# Patient Record
Sex: Male | Born: 1950
Health system: Southern US, Community
[De-identification: ages and names within clinical notes are randomized; demographics above are authoritative.]

## PROBLEM LIST (undated history)

## (undated) DIAGNOSIS — F200 Paranoid schizophrenia: Secondary | ICD-10-CM

## (undated) DIAGNOSIS — I639 Cerebral infarction, unspecified: Secondary | ICD-10-CM

## (undated) DIAGNOSIS — I219 Acute myocardial infarction, unspecified: Secondary | ICD-10-CM

## (undated) DIAGNOSIS — F952 Tourette's disorder: Secondary | ICD-10-CM

## (undated) DIAGNOSIS — E119 Type 2 diabetes mellitus without complications: Secondary | ICD-10-CM

## (undated) DIAGNOSIS — K219 Gastro-esophageal reflux disease without esophagitis: Secondary | ICD-10-CM

## (undated) DIAGNOSIS — I10 Essential (primary) hypertension: Secondary | ICD-10-CM

## (undated) HISTORY — PX: CORONARY ARTERY BYPASS GRAFT: SHX141

## (undated) HISTORY — PX: CARDIAC SURGERY: SHX584

## (undated) HISTORY — PX: HAND SURGERY: SHX662

## (undated) HISTORY — PX: TONSILLECTOMY: SUR1361

---

## 2010-09-10 DIAGNOSIS — H9190 Unspecified hearing loss, unspecified ear: Secondary | ICD-10-CM | POA: Insufficient documentation

## 2010-09-10 DIAGNOSIS — F411 Generalized anxiety disorder: Secondary | ICD-10-CM | POA: Insufficient documentation

## 2010-09-10 DIAGNOSIS — F952 Tourette's disorder: Secondary | ICD-10-CM

## 2011-03-04 DIAGNOSIS — I10 Essential (primary) hypertension: Secondary | ICD-10-CM

## 2011-03-04 DIAGNOSIS — E079 Disorder of thyroid, unspecified: Secondary | ICD-10-CM

## 2011-06-03 DIAGNOSIS — E559 Vitamin D deficiency, unspecified: Secondary | ICD-10-CM | POA: Insufficient documentation

## 2013-04-12 DIAGNOSIS — R079 Chest pain, unspecified: Secondary | ICD-10-CM | POA: Insufficient documentation

## 2013-04-30 DIAGNOSIS — H8110 Benign paroxysmal vertigo, unspecified ear: Secondary | ICD-10-CM | POA: Insufficient documentation

## 2013-06-24 DIAGNOSIS — R2990 Unspecified symptoms and signs involving the nervous system: Secondary | ICD-10-CM | POA: Insufficient documentation

## 2014-02-04 DIAGNOSIS — G459 Transient cerebral ischemic attack, unspecified: Secondary | ICD-10-CM | POA: Insufficient documentation

## 2014-02-04 DIAGNOSIS — G252 Other specified forms of tremor: Secondary | ICD-10-CM | POA: Insufficient documentation

## 2014-02-26 DIAGNOSIS — F419 Anxiety disorder, unspecified: Secondary | ICD-10-CM | POA: Insufficient documentation

## 2014-04-22 DIAGNOSIS — F603 Borderline personality disorder: Secondary | ICD-10-CM | POA: Insufficient documentation

## 2014-08-03 ENCOUNTER — Inpatient Hospital Stay: Payer: Self-pay | Admitting: Psychiatry
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
## 2015-03-11 NOTE — Discharge Summary (Signed)
PATIENT NAME:  Jason Jason [name], Jason Jason [name] MR#:  [id number] DATE OF BIRTH:  1951/03/19  DATE OF ADMISSION:  08/03/2014 DATE OF DISCHARGE:  08/04/2014  IDENTIFYING INFORMATION: The patient is Jason [name] 64 year old divorced Caucasian male from [facility] assisted living in [place].   CHIEF COMPLAINT: "I don't know why am I here. I did not do anything."   DISCHARGE DIAGNOSES: AXIS I: Major depressive disorder with psychotic features by history.  AXIS II: Deferred.  AXIS III: Tourette syndrome. AXIS IV: Obesity, diabetes, hypertension, history of 4 myocardial infarctions, history of CABG, chronic obstructive pulmonary disease, chronic pain, gastroesophageal reflux disease, tremors, benign prostatic hypertrophy, hypothyroidism.  AXIS IV: Relational problems with staff from assistant living facility.   DISCHARGE MEDICATIONS: Cymbalta 90 mg p.o. daily, Abilify 30 mg p.o. daily, haloperidol 5 mg p.o. at bedtime, aspirin 81 mg p.o. daily, Flomax 0.4 mg p.o. daily, Imdur 60 mg p.o. daily, gabapentin 300 mg p.o. daily, metformin 500 mg p.o. b.i.d., pioglitazone 45 mg p.o. daily, Levemir 30 units in the morning and 80 units at bedtime, atorvastatin 80 mg p.o. daily, hydrochlorothiazide/losartan 12.5/50 mg p.o. daily, Plavix 75 mg p.o. daily, Lasix 40 mg p.o. daily, K-Dur 20 milliequivalents p.o. daily, Synthroid 75 mcg p.o. daily, vitamin B12 injectable subcu every month 1000 mcg, NovoLog 22 units 3 times Jason [name] day with meals, primidone 300 mg p.o. daily.   HOSPITAL COURSE: The patient was referred to our facility by [facility]. The patient had contacted EMS on September 11th and reported he was suicidal. He then was taken to the Emergency Department and involuntarily committed for further evaluation.  Per the referral information, the patient had stated that he was going to stab himself if he was discharged back to assisted living where he came from. The patient was interviewed and he denied ever saying he  was suicidal or that he had threatened about hurting himself. He reported that he was not liking the assisted living where he was living and wanting to be placed in Jason [name] different facility. He did not like the food and he felt that the staff was not telling the truth. He stated that he had called EMS because he had chest pain and could not get Jason [name] hold of the medication nurse. He did not understand why he was hospitalized and he wanted to be discharged. During evaluation the patient appeared to be psychiatrically stable. There was no evidence of psychosis, depression, suicidality or homicidality. The patient was calm, pleasant and cooperative. The patient has Jason [name] multitude of medical problems and all of them appeared to be stable as well per laboratory results.  There was no clear need for continuing or prolonging this psychiatric hospitalization. The patient did not appear to be Jason [name] threat to himself or anybody else. It was then decided to discharge the patient back to the facility in [place], [place]. Reviewing his medications on admission it was noted that there was Jason [name] lot of inconsistencies. We attempted to contact the facility Jason [name] multitude of times in order to corroborate his home medications. However, we were not able to contact the facility until late in the afternoon on September 17th.  At that time, they faxed the [facility] and the medications were corrected. During his short stay Jason [name] rapid response was called on the day of discharge after one of the night nurses attempted to wake him up for his morning medications but could not arouse him.  Rapid response arrived to the unit, but Jason [name] few minutes later the  patient woke up in no acute distress and no intervention was necessary. It is unclear as to what happened, but at that time the patient was alert and his vital signs were stable. He denied any complaints.   MENTAL STATUS EXAMINATION AT TIME OF DISCHARGE: The patient is Jason [name] 64 year old year-old obese, Caucasian male  who appears older than his stated age. He displays good hygiene and good grooming. He was calm, pleasant and cooperative. Eye contact was within normal range. Psychomotor activity was within normal range. Speech had regular tone, volume, and rate. Thought process was linear. Thought content was negative for suicidality or homicidality. Perception negative for psychosis. Mood euthymic. Affect reactive. Insight and judgment limited.   DIAGNOSTIC DATA: There were no laboratory results obtained during this hospitalization. All [facility]. CBC and comprehensive metabolic panel all were within normal limits.   Here the patient had an EKG showing Jason [name] QTc of 475. The interpretation was sinus rhythm with first-degree Jason [name]-V block, right bundle branch block.   Glucose at discharge was 240.   DISCHARGE DISPOSITION: The patient will return to [facility] [place]. The patient is to continue to follow up with his outpatient psychiatrist in that area.  Time use to create discharge >30 minutes  ____________________________ Jimmy FootmanAndrea Hernandez-Gonzalez, MD ahg:sb D: 08/05/2014 14:11:34 ET T: 08/05/2014 14:47:13 ET JOB#: [id number]  cc: Jimmy FootmanAndrea Hernandez-Gonzalez, MD, <Dictator> Horton ChinANDREA HERNANDEZ GONZAL MD ELECTRONICALLY SIGNED 08/05/2014 15:55

## 2015-03-13 DIAGNOSIS — J90 Pleural effusion, not elsewhere classified: Secondary | ICD-10-CM | POA: Insufficient documentation

## 2015-03-13 DIAGNOSIS — R0602 Shortness of breath: Secondary | ICD-10-CM | POA: Insufficient documentation

## 2015-03-13 DIAGNOSIS — F259 Schizoaffective disorder, unspecified: Secondary | ICD-10-CM | POA: Insufficient documentation

## 2015-03-13 DIAGNOSIS — Z7901 Long term (current) use of anticoagulants: Secondary | ICD-10-CM | POA: Insufficient documentation

## 2015-03-13 DIAGNOSIS — I480 Paroxysmal atrial fibrillation: Secondary | ICD-10-CM | POA: Insufficient documentation

## 2015-03-14 DIAGNOSIS — R7989 Other specified abnormal findings of blood chemistry: Secondary | ICD-10-CM | POA: Insufficient documentation

## 2015-03-14 DIAGNOSIS — R778 Other specified abnormalities of plasma proteins: Secondary | ICD-10-CM | POA: Insufficient documentation

## 2015-03-14 DIAGNOSIS — R7881 Bacteremia: Secondary | ICD-10-CM | POA: Insufficient documentation

## 2015-12-14 DIAGNOSIS — E1165 Type 2 diabetes mellitus with hyperglycemia: Secondary | ICD-10-CM | POA: Insufficient documentation

## 2015-12-14 DIAGNOSIS — Z951 Presence of aortocoronary bypass graft: Secondary | ICD-10-CM | POA: Insufficient documentation

## 2015-12-14 DIAGNOSIS — G4733 Obstructive sleep apnea (adult) (pediatric): Secondary | ICD-10-CM | POA: Insufficient documentation

## 2015-12-14 DIAGNOSIS — M5020 Other cervical disc displacement, unspecified cervical region: Secondary | ICD-10-CM | POA: Insufficient documentation

## 2015-12-14 DIAGNOSIS — M5030 Other cervical disc degeneration, unspecified cervical region: Secondary | ICD-10-CM | POA: Insufficient documentation

## 2016-06-12 DIAGNOSIS — F339 Major depressive disorder, recurrent, unspecified: Secondary | ICD-10-CM | POA: Insufficient documentation

## 2016-06-13 DIAGNOSIS — L989 Disorder of the skin and subcutaneous tissue, unspecified: Secondary | ICD-10-CM | POA: Insufficient documentation

## 2016-06-24 DIAGNOSIS — K5901 Slow transit constipation: Secondary | ICD-10-CM | POA: Insufficient documentation

## 2016-08-29 DIAGNOSIS — L03116 Cellulitis of left lower limb: Secondary | ICD-10-CM | POA: Insufficient documentation

## 2016-12-26 DIAGNOSIS — E669 Obesity, unspecified: Secondary | ICD-10-CM | POA: Insufficient documentation

## 2016-12-26 DIAGNOSIS — R6250 Unspecified lack of expected normal physiological development in childhood: Secondary | ICD-10-CM | POA: Insufficient documentation

## 2017-02-06 DIAGNOSIS — E785 Hyperlipidemia, unspecified: Secondary | ICD-10-CM | POA: Insufficient documentation

## 2017-02-06 DIAGNOSIS — I5030 Unspecified diastolic (congestive) heart failure: Secondary | ICD-10-CM | POA: Insufficient documentation

## 2017-02-06 DIAGNOSIS — R55 Syncope and collapse: Secondary | ICD-10-CM | POA: Insufficient documentation

## 2017-02-06 DIAGNOSIS — S32415A Nondisplaced fracture of anterior wall of left acetabulum, initial encounter for closed fracture: Secondary | ICD-10-CM | POA: Insufficient documentation

## 2017-02-16 DIAGNOSIS — M79602 Pain in left arm: Secondary | ICD-10-CM | POA: Insufficient documentation

## 2017-03-04 DIAGNOSIS — M810 Age-related osteoporosis without current pathological fracture: Secondary | ICD-10-CM | POA: Insufficient documentation

## 2017-03-04 DIAGNOSIS — R41841 Cognitive communication deficit: Secondary | ICD-10-CM | POA: Insufficient documentation

## 2017-03-04 DIAGNOSIS — Z9181 History of falling: Secondary | ICD-10-CM | POA: Insufficient documentation

## 2017-03-27 DIAGNOSIS — E291 Testicular hypofunction: Secondary | ICD-10-CM | POA: Insufficient documentation

## 2017-03-27 DIAGNOSIS — M859 Disorder of bone density and structure, unspecified: Secondary | ICD-10-CM | POA: Insufficient documentation

## 2017-03-28 DIAGNOSIS — S32432D Displaced fracture of anterior column [iliopubic] of left acetabulum, subsequent encounter for fracture with routine healing: Secondary | ICD-10-CM | POA: Insufficient documentation

## 2017-08-12 DIAGNOSIS — N400 Enlarged prostate without lower urinary tract symptoms: Secondary | ICD-10-CM | POA: Insufficient documentation

## 2017-08-19 DIAGNOSIS — L97909 Non-pressure chronic ulcer of unspecified part of unspecified lower leg with unspecified severity: Secondary | ICD-10-CM | POA: Insufficient documentation

## 2017-08-19 DIAGNOSIS — I83012 Varicose veins of right lower extremity with ulcer of calf: Secondary | ICD-10-CM | POA: Insufficient documentation

## 2017-08-19 DIAGNOSIS — Z9119 Patient's noncompliance with other medical treatment and regimen: Secondary | ICD-10-CM | POA: Insufficient documentation

## 2017-08-19 DIAGNOSIS — L97211 Non-pressure chronic ulcer of right calf limited to breakdown of skin: Secondary | ICD-10-CM | POA: Insufficient documentation

## 2017-08-19 DIAGNOSIS — Z91199 Patient's noncompliance with other medical treatment and regimen due to unspecified reason: Secondary | ICD-10-CM | POA: Insufficient documentation

## 2017-10-01 DIAGNOSIS — R42 Dizziness and giddiness: Secondary | ICD-10-CM

## 2017-10-16 DIAGNOSIS — F332 Major depressive disorder, recurrent severe without psychotic features: Secondary | ICD-10-CM | POA: Insufficient documentation

## 2018-07-23 ENCOUNTER — Emergency Department
Admission: EM | Admit: 2018-07-23 | Discharge: 2018-07-23 | Disposition: A | Discharge status: Home / Self Care | Payer: Medicare Other | Attending: Emergency Medicine | Admitting: Emergency Medicine

## 2018-07-23 ENCOUNTER — Other Ambulatory Visit: Payer: Self-pay

## 2018-07-23 ENCOUNTER — Encounter: Payer: Self-pay | Admitting: Emergency Medicine

## 2018-07-23 DIAGNOSIS — E079 Disorder of thyroid, unspecified: Secondary | ICD-10-CM

## 2018-07-23 DIAGNOSIS — F99 Mental disorder, not otherwise specified: Secondary | ICD-10-CM | POA: Diagnosis present

## 2018-07-23 DIAGNOSIS — E119 Type 2 diabetes mellitus without complications: Secondary | ICD-10-CM | POA: Diagnosis not present

## 2018-07-23 DIAGNOSIS — I10 Essential (primary) hypertension: Secondary | ICD-10-CM | POA: Diagnosis not present

## 2018-07-23 DIAGNOSIS — F200 Paranoid schizophrenia: Secondary | ICD-10-CM | POA: Diagnosis not present

## 2018-07-23 DIAGNOSIS — F419 Anxiety disorder, unspecified: Secondary | ICD-10-CM | POA: Diagnosis not present

## 2018-07-23 DIAGNOSIS — F952 Tourette's disorder: Secondary | ICD-10-CM

## 2018-07-23 HISTORY — DX: Paranoid schizophrenia: F20.0

## 2018-07-23 HISTORY — DX: Type 2 diabetes mellitus without complications: E11.9

## 2018-07-23 HISTORY — DX: Gastro-esophageal reflux disease without esophagitis: K21.9

## 2018-07-23 HISTORY — DX: Essential (primary) hypertension: I10

## 2018-07-23 HISTORY — DX: Tourette's disorder: F95.2

## 2018-07-23 LAB — COMPREHENSIVE METABOLIC PANEL
ALK PHOS: 58 U/L (ref 38–126)
ALT: 21 U/L (ref 0–44)
AST: 20 U/L (ref 15–41)
Albumin: 3.6 g/dL (ref 3.5–5.0)
Anion gap: 10 (ref 5–15)
BUN: 17 mg/dL (ref 8–23)
CHLORIDE: 99 mmol/L (ref 98–111)
CO2: 27 mmol/L (ref 22–32)
CREATININE: 1.2 mg/dL (ref 0.61–1.24)
Calcium: 9.3 mg/dL (ref 8.9–10.3)
GFR calc non Af Amer: >60 mL/min (ref >60)
Glucose, Bld: 214 mg/dL — ABNORMAL HIGH (ref 70–99)
Potassium: 3.9 mmol/L (ref 3.5–5.1)
Sodium: 136 mmol/L (ref 135–145)
Total Bilirubin: 0.7 mg/dL (ref 0.3–1.2)
Total Protein: 7 g/dL (ref 6.5–8.1)

## 2018-07-23 LAB — CBC WITH DIFFERENTIAL/PLATELET
BASOS PCT: 1 %
Basophils Absolute: 0 10*3/uL (ref 0–0.1)
EOS ABS: 0.2 10*3/uL (ref 0–0.7)
Eosinophils Relative: 3 %
HEMATOCRIT: 33.5 % — AB (ref 40.0–52.0)
HEMOGLOBIN: 11.5 g/dL — AB (ref 13.0–18.0)
LYMPHS ABS: 0.8 10*3/uL — AB (ref 1.0–3.6)
Lymphocytes Relative: 13 %
MCH: 28.1 pg (ref 26.0–34.0)
MCHC: 34.4 g/dL (ref 32.0–36.0)
MCV: 81.7 fL (ref 80.0–100.0)
Monocytes Absolute: 0.6 10*3/uL (ref 0.2–1.0)
Monocytes Relative: 9 %
NEUTROS ABS: 4.7 10*3/uL (ref 1.4–6.5)
NEUTROS PCT: 74 %
Platelets: 218 10*3/uL (ref 150–440)
RBC: 4.1 MIL/uL — AB (ref 4.40–5.90)
RDW: 17.5 % — ABNORMAL HIGH (ref 11.5–14.5)
WBC: 6.4 10*3/uL (ref 3.8–10.6)

## 2018-07-23 LAB — ETHANOL: Alcohol, Ethyl (B): <10 mg/dL (ref <10)

## 2018-07-23 LAB — SALICYLATE LEVEL

## 2018-07-23 LAB — ACETAMINOPHEN LEVEL

## 2018-07-23 NOTE — ED Notes (Addendum)
First Nurse Note: Patient here with Dr [facility] [employer] Deputy for voluntary commitment.  Patient alert.  HOH.  Patient taken directly to Triage 1.

## 2018-07-23 NOTE — BH Assessment (Signed)
Patient seen by Psych MD (Dr. Toni Amend) and determine he do not meet inpatient criteria and will be discharge. Patient do not need to be seen by TTS.

## 2018-07-23 NOTE — ED Notes (Addendum)
He arrives with reports that he was moved to a group home on [address] yesterday after being in a home in [place] for four months - states " I knew from the moment I arrived that I was not going to be happy there - I tried to call my guardian  Candy [place] - [place] [employer]  [phone] but she wouldn't answer the phone  - I could not sleep there - I am scared"   Pt reassured    Pt takes Plavix

## 2018-07-23 NOTE — ED Notes (Addendum)
2 unsuccessful attempts to obtain blood.

## 2018-07-23 NOTE — ED Provider Notes (Signed)
[facility] Emergency Department Provider Note ____________________________________________   I have reviewed the triage vital signs and the triage nursing note.  HISTORY  Chief Complaint Voluntary Commitment   Historian Patient  HPI Jason [name] is a 67 y.o. male with history of paranoid schizophrenia presents from group home where he is only been for 1 night it sounds like.  Patient was previously in a different group home for several months but states that it was worked hard to find him a home that was closer to his home in [place] and his sons that live in [place].  Patient states that when he got there last night he was upset because they were "all black people there" and "no women there "and there was no oxygen available and he wants "extra air to breathe "and that there is not a nurse call button in his room and he states that he is "very social "and needs to have someone to talk to.  He states that in the middle of the night he went to find the staff member who suggested he go back to sleep because it was 2:30 in the morning and he was upset by this.  Patient states that he feels unsafe therapy because of the "blacks "although he does not report any actual incident or threat or anything.  Patient states that if he returns to the group home he will "just walk away."    Past Medical History:  Diagnosis Date  . Diabetes mellitus without complication (HCC)   . GERD (gastroesophageal reflux disease)   . Hypertension   . Paranoid schizophrenia (HCC)   . Tourette disorder     There are no active problems to display for this patient.   Past Surgical History:  Procedure Laterality Date  . CARDIAC SURGERY    . HAND SURGERY    . TONSILLECTOMY      Prior to Admission medications   Not on File    Allergies  Allergen Reactions  . Keflex [Cephalexin] Rash    No family history on file.  Social History Social History   Tobacco Use   . Smoking status: Not on file  Substance Use Topics  . Alcohol use: Not Currently  . Drug use: Not Currently    Review of Systems  Constitutional: Negative for fever. Eyes: Negative for visual changes. ENT: Negative for sore throat.  He is chronically hard of hearing. Cardiovascular: Negative for chest pain. Respiratory: Negative for shortness of breath. Gastrointestinal: Negative for abdominal pain, vomiting and diarrhea. Genitourinary: Negative for dysuria. Musculoskeletal: Negative for back pain. Skin: Negative for rash. Neurological: Negative for headache.  ____________________________________________   PHYSICAL EXAM:  VITAL SIGNS: ED Triage Vitals  Enc Vitals Group     BP 07/23/18 0746 (!) 157/104     Pulse Rate 07/23/18 0746 77     Resp 07/23/18 0746 16     Temp 07/23/18 0746 97.7 F (36.5 C)     Temp Source 07/23/18 0746 Oral     SpO2 07/23/18 0746 96 %     Weight 07/23/18 0747 292 lb (132.5 kg)     Height 07/23/18 0747 6' (1.829 m)     Head Circumference --      Peak Flow --      Pain Score 07/23/18 0747 0     Pain Loc --      Pain Edu? --      Excl. in GC? --      Constitutional: Alert  and cooperative but anxious.  HEENT      Head: Normocephalic and atraumatic.      Eyes: Conjunctivae are normal. Pupils equal and round.       Ears:   Hard of hearing.      Nose: No congestion/rhinnorhea.      Mouth/Throat: Mucous membranes are moist.      Neck: No stridor. Cardiovascular/Chest: Normal rate, regular rhythm.  No murmurs, rubs, or gallops. Respiratory: Normal respiratory effort without tachypnea nor retractions. Breath sounds are clear and equal bilaterally. No wheezes/rales/rhonchi. Gastrointestinal: Soft. No distention, no guarding, no rebound. Nontender.  Obese Genitourinary/rectal:Deferred Musculoskeletal: Nontender with normal range of motion in all extremities. No joint effusions.  No lower extremity tenderness.  No edema. Neurologic:  Normal  speech and language. No gross or focal neurologic deficits are appreciated. Skin:  Skin is warm, dry and intact. No rash noted. Psychiatric: Anxious.  Somewhat paranoid.  Poor insight and judgment.   ____________________________________________  LABS (pertinent positives/negatives) I, Governor Rooks, MD the attending physician have reviewed the labs noted below.  Labs Reviewed  COMPREHENSIVE METABOLIC PANEL - Abnormal; Notable for the following components:      Result Value   Glucose, Bld 214 (*)    All other components within normal limits  CBC WITH DIFFERENTIAL/PLATELET - Abnormal; Notable for the following components:   RBC 4.10 (*)    Hemoglobin 11.5 (*)    HCT 33.5 (*)    RDW 17.5 (*)    Lymphs Abs 0.8 (*)    All other components within normal limits  ACETAMINOPHEN LEVEL - Abnormal; Notable for the following components:   Acetaminophen (Tylenol), Serum <10 (*)    All other components within normal limits  ETHANOL  SALICYLATE LEVEL  URINE DRUG SCREEN, QUALITATIVE ([facility] ONLY)  URINALYSIS, COMPLETE ([facility]MP) WITH MICROSCOPIC    ____________________________________________    EKG I, Governor Rooks, MD, the attending physician have personally viewed and interpreted all ECGs.  None ____________________________________________  RADIOLOGY   None __________________________________________  PROCEDURES  Procedure(s) performed: None  Procedures  Critical Care performed: None   ____________________________________________  ED COURSE / ASSESSMENT AND PLAN  Pertinent labs & imaging results that were available during my care of the patient were reviewed by me and considered in my medical decision making (see chart for details).    Patient arrived from a group home, does have a history of paranoid schizophrenia.  He is exhibiting some paranoia regarding African-American roommates at his new group home that he does not feel safe there.  Patient is not reporting suicidal  ideation but does state that he is going to leave and walk away from the group home if he sent back.  He does seem quite anxious and is continuing to get himself very hyped up out of concern about being sent back.  I have consulted behavioral medicine intake as well as psychiatrist.  I spoke with Dr. Toni Amend face-to-face who is recommended discharge back to his group home.  Patient was cooperative.    CONSULTATIONS: TTS and psychiatry.   Patient / Family / Caregiver informed of clinical course, medical decision-making process, and agree with plan.    ___________________________________________   FINAL CLINICAL IMPRESSION(S) / ED DIAGNOSES   Final diagnoses:  Paranoid schizophrenia (HCC)  Anxiety      ___________________________________________         Note: This dictation was prepared with Dragon dictation. Any transcriptional errors that result from this process are unintentional    Governor Rooks, MD  07/25/18 0748  

## 2018-07-23 NOTE — ED Notes (Signed)
Pt picked up by caregiver/ group home owners spouse  Discharge instructions reviewed with him and the pt and they both verbalized agreement and understanding

## 2018-07-23 NOTE — Consult Note (Signed)
  Psychiatry: Brief note, full note to follow.  Patient does not require hospital level treatment or any acute change in medication or treatment.  Case reviewed with TTS and emergency room doctor.  Recommend discharge back to his group home.

## 2018-07-23 NOTE — ED Notes (Signed)
BEHAVIORAL HEALTH ROUNDING Patient sleeping: No. Patient alert and oriented: yes Behavior appropriate: Yes.  ; If no, describe:  Nutrition and fluids offered: yes Toileting and hygiene offered: Yes  Sitter present: q15 minute observations and security monitoring Law enforcement present: Yes    

## 2018-07-23 NOTE — ED Notes (Addendum)
Called  [phone]  [facility] and informed them that he has been discharged  - I also called Lawanda Ray  Group home owner [phone] and left a message that he is ready to discharged

## 2018-07-23 NOTE — ED Notes (Signed)

## 2018-07-23 NOTE — Consult Note (Signed)
[facility] Face-to-Face Psychiatry Consult   Reason for Consult: Consult for this 67 year old man who came to the hospital from his group home Referring Physician: Reita Cliche Patient Identification: Jason [name] MRN:  [id number] Principal Diagnosis: Paranoid schizophrenia [facility]) Diagnosis:   Patient Active Problem List   Diagnosis Date Noted  . Paranoid schizophrenia ([place]) [F20.0] 07/23/2018  . Diabetes mellitus without complication ([place]) [E17.4] 07/23/2018  . Tourette disorder [F95.2] 07/23/2018    Total Time spent with patient: 1 hour  Subjective:   Jason [name] is a 67 y.o. male patient admitted with "I was unhappy at that group home".  HPI: Patient seen chart reviewed.  Patient with a history of schizophrenia came to the hospital after calling an ambulance himself.  Patient says he recently moved into his new group home and has only been there a couple days.  He does not like it there.  He has a list of complaints including the fact that the air conditioning does not work as well as he would like and that they do not have call buttons.  He could not sleep at night and tried to wake up the staff at 2:00 in the morning and they were not accommodating to him.  Patient denies any suicidal or homicidal thoughts.  Denies any hallucinations.  Says he is compliant with all of his medicine.  His only request really is that he not go back to his group home because he is "afraid" there.  He cannot give any reason why he would actually be in danger.  Social history: Patient has a guardian.  Recently moved to a new group home to be closer to his children in [place].  Medical history: Diabetes overweight  Substance abuse history: Denies any history of alcohol or drug abuse ever.    Past Psychiatric History: Patient has a history of schizophrenia.  Previously had been living in the [place] part of the state recently moved up here to be closer to his children in [place].  Positive past  history of hospitalizations.  It sounds like he has been difficult to place and has gone through many group homes.  Denies any actual past attempts to kill himself or violence.  Risk to Self:   Risk to Others:   Prior Inpatient Therapy:   Prior Outpatient Therapy:    Past Medical History:  Past Medical History:  Diagnosis Date  . Diabetes mellitus without complication ([place])   . GERD (gastroesophageal reflux disease)   . Hypertension   . Paranoid schizophrenia ([place])   . Tourette disorder     Past Surgical History:  Procedure Laterality Date  . CARDIAC SURGERY    . HAND SURGERY    . TONSILLECTOMY     Family History: No family history on file. Family Psychiatric  History: Does not know of any Social History:  Social History   Substance and Sexual Activity  Alcohol Use Not Currently     Social History   Substance and Sexual Activity  Drug Use Not Currently    Social History   Socioeconomic History  . Marital status: Single    Spouse name: Not on file  . Number of children: Not on file  . Years of education: Not on file  . Highest education level: Not on file  Occupational History  . Not on file  Social Needs  . Financial resource strain: Not on file  . Food insecurity:    Worry: Not on file    Inability: Not on file  .  Transportation needs:    Medical: Not on file    Non-medical: Not on file  Tobacco Use  . Smoking status: Not on file  Substance and Sexual Activity  . Alcohol use: Not Currently  . Drug use: Not Currently  . Sexual activity: Not Currently  Lifestyle  . Physical activity:    Days per week: Not on file    Minutes per session: Not on file  . Stress: Not on file  Relationships  . Social connections:    Talks on phone: Not on file    Gets together: Not on file    Attends religious service: Not on file    Active member of club or organization: Not on file    Attends meetings of clubs or organizations: Not on file    Relationship status: Not  on file  Other Topics Concern  . Not on file  Social History Narrative  . Not on file   Additional Social History:    Allergies:   Allergies  Allergen Reactions  . Keflex [Cephalexin] Rash    Labs:  Results for orders placed or performed during the hospital encounter of 07/23/18 (from the past 48 hour(s))  Comprehensive metabolic panel     Status: Abnormal   Collection Time: 07/23/18  7:54 AM  Result Value Ref Range   Sodium 136 135 - 145 mmol/L   Potassium 3.9 3.5 - 5.1 mmol/L   Chloride 99 98 - 111 mmol/L   CO2 27 22 - 32 mmol/L   Glucose, Bld 214 (H) 70 - 99 mg/dL   BUN 17 8 - 23 mg/dL   Creatinine, Ser 1.20 0.61 - 1.24 mg/dL   Calcium 9.3 8.9 - 10.3 mg/dL   Total Protein 7.0 6.5 - 8.1 g/dL   Albumin 3.6 3.5 - 5.0 g/dL   AST 20 15 - 41 U/L   ALT 21 0 - 44 U/L   Alkaline Phosphatase 58 38 - 126 U/L   Total Bilirubin 0.7 0.3 - 1.2 mg/dL   GFR calc non Af Amer >60 >60 mL/min   GFR calc Af Amer >60 >60 mL/min    Comment: (NOTE) The eGFR has been calculated using the CKD EPI equation. This calculation has not been validated in all clinical situations. eGFR's persistently <60 mL/min signify possible Chronic Kidney Disease.    Anion gap 10 5 - 15    Comment: Performed at [facility], [address] [place], [place] [address]  Ethanol     Status: None   Collection Time: 07/23/18  7:54 AM  Result Value Ref Range   Alcohol, Ethyl (B) <10 <10 mg/dL    Comment: (NOTE) Lowest detectable limit for serum alcohol is 10 mg/dL. For medical purposes only. Performed at [facility], [address] [place], [place] [address]   CBC with Diff     Status: Abnormal   Collection Time: 07/23/18  7:54 AM  Result Value Ref Range   WBC 6.4 3.8 - 10.6 K/uL   RBC 4.10 (L) 4.40 - 5.90 MIL/uL   Hemoglobin 11.5 (L) 13.0 - 18.0 g/dL   HCT 33.5 (L) 40.0 - 52.0 %   MCV 81.7 80.0 - 100.0 fL   MCH 28.1 26.0 - 34.0 pg   MCHC 34.4 32.0 - 36.0 g/dL   RDW 17.5 (H) 11.5 -  14.5 %   Platelets 218 150 - 440 K/uL   Neutrophils Relative % 74 %   Neutro Abs 4.7 1.4 - 6.5 K/uL  Lymphocytes Relative 13 %   Lymphs Abs 0.8 (L) 1.0 - 3.6 K/uL   Monocytes Relative 9 %   Monocytes Absolute 0.6 0.2 - 1.0 K/uL   Eosinophils Relative 3 %   Eosinophils Absolute 0.2 0 - 0.7 K/uL   Basophils Relative 1 %   Basophils Absolute 0.0 0 - 0.1 K/uL    Comment: Performed at [facility], [address], [place], [place] [address]  Salicylate level     Status: None   Collection Time: 07/23/18  7:54 AM  Result Value Ref Range   Salicylate Lvl <9.0 2.8 - 30.0 mg/dL    Comment: Performed at [facility], [address] [place], [place] [address]  Acetaminophen level     Status: Abnormal   Collection Time: 07/23/18  7:54 AM  Result Value Ref Range   Acetaminophen (Tylenol), Serum <10 (L) 10 - 30 ug/mL    Comment: (NOTE) Therapeutic concentrations vary significantly. A range of 10-30 ug/mL  may be an effective concentration for many patients. However, some  are best treated at concentrations outside of this range. Acetaminophen concentrations >150 ug/mL at 4 hours after ingestion  and >50 ug/mL at 12 hours after ingestion are often associated with  toxic reactions. Performed at [facility], [address] [place], [place] [address]     No current facility-administered medications for this encounter.    No current outpatient medications on file.    Musculoskeletal: Strength & Muscle Tone: within normal limits Gait & Station: normal Patient leans: N/A  Psychiatric Specialty Exam: Physical Exam  Nursing note and vitals reviewed. Constitutional: He appears well-developed and well-nourished.  HENT:  Head: Normocephalic and atraumatic.  Eyes: Pupils are equal, round, and reactive to light. Conjunctivae are normal.  Neck: Normal range of motion.  Cardiovascular: Regular rhythm and normal heart sounds.  Respiratory: Effort normal. No  respiratory distress.  GI: Soft.  Musculoskeletal: Normal range of motion.  Neurological: He is alert.  Skin: Skin is warm and dry.  Psychiatric: His mood appears anxious. His speech is delayed. He is slowed. Thought content is not paranoid. Cognition and memory are impaired. He expresses impulsivity. He expresses no homicidal and no suicidal ideation.    Review of Systems  Constitutional: Negative.   HENT: Negative.   Eyes: Negative.   Respiratory: Negative.   Cardiovascular: Negative.   Gastrointestinal: Negative.   Musculoskeletal: Negative.   Skin: Negative.   Neurological: Negative.   Psychiatric/Behavioral: Negative for depression, hallucinations, memory loss, substance abuse and suicidal ideas. The patient is nervous/anxious and has insomnia.     Blood pressure 139/88, pulse 79, temperature 97.9 F (36.6 C), resp. rate 17, height 6' (1.829 m), weight 132.5 kg, SpO2 97 %.Body mass index is 39.6 kg/m.  General Appearance: Casual  Eye Contact:  Good  Speech:  Clear and Coherent  Volume:  Normal  Mood:  Euthymic  Affect:  Constricted  Thought Process:  Goal Directed  Orientation:  Full (Time, Place, and Person)  Thought Content:  Logical  Suicidal Thoughts:  No  Homicidal Thoughts:  No  Memory:  Immediate;   Fair Recent;   Fair Remote;   Fair  Judgement:  Fair  Insight:  Fair  Psychomotor Activity:  Decreased  Concentration:  Concentration: Fair  Recall:  [employer] of Knowledge:  Fair  Language:  Fair  Akathisia:  No  Handed:  Right  AIMS (if indicated):     Assets:  Desire for Improvement  ADL's:  Intact  Cognition:  Impaired,  Mild  Sleep:        Treatment Plan Summary: Plan 67 year old man with schizophrenia who came here to the emergency room just out of a whim of disliking his group home.  No evidence of any dangerousness.  Not currently psychotic.  No acute medical problems.  Patient does not meet commitment criteria and does not require inpatient  treatment.  I did some supportive counseling and encouragement with him.  No change to medicine.  Patient should be discharged back to his group home with follow-up in the community.  Disposition: No evidence of imminent risk to self or others at present.    Alethia Berthold, MD 07/23/2018 9:03 PM

## 2018-07-23 NOTE — ED Triage Notes (Signed)
Patient dressed out and belongings secured by this RN and Theodoro Grist, EDT. Tennis shoes, blue tshirt, grey shorts placed in one patient belonging bag.

## 2018-07-23 NOTE — ED Notes (Signed)
BEHAVIORAL HEALTH ROUNDING Patient sleeping: No. Patient alert and oriented: yes Behavior appropriate: Yes.  ; If no, describe:  Nutrition and fluids offered: yes Toileting and hygiene offered: Yes  Sitter present: q15 minute observations and security  monitoring Law enforcement present: Yes  ODS  

## 2018-07-23 NOTE — Discharge Instructions (Addendum)
Return to the emergency room immediately for any worsening condition including agitation or violence, concern about harm to yourself or others, or any other symptoms concerning to you.

## 2018-07-23 NOTE — ED Triage Notes (Addendum)
Patient presents voluntarily from group home with [facility]. Patient states that he moved into group home yesterday and is now afraid for his life. Patient states that the temperature at the facility is too hot for him and "there are no women folk to associate with, just men." Patient denies SI/HI.

## 2018-08-01 ENCOUNTER — Encounter: Payer: Self-pay | Admitting: Emergency Medicine

## 2018-08-01 ENCOUNTER — Emergency Department
Admission: EM | Admit: 2018-08-01 | Discharge: 2018-08-02 | Disposition: A | Discharge status: Home / Self Care | Payer: Medicare Other | Attending: Emergency Medicine | Admitting: Emergency Medicine

## 2018-08-01 ENCOUNTER — Other Ambulatory Visit: Payer: Self-pay

## 2018-08-01 DIAGNOSIS — E119 Type 2 diabetes mellitus without complications: Secondary | ICD-10-CM | POA: Insufficient documentation

## 2018-08-01 DIAGNOSIS — F200 Paranoid schizophrenia: Secondary | ICD-10-CM | POA: Diagnosis not present

## 2018-08-01 DIAGNOSIS — I10 Essential (primary) hypertension: Secondary | ICD-10-CM | POA: Insufficient documentation

## 2018-08-01 DIAGNOSIS — R451 Restlessness and agitation: Secondary | ICD-10-CM | POA: Insufficient documentation

## 2018-08-01 NOTE — ED Triage Notes (Addendum)
Pt. Here from [facility].

## 2018-08-01 NOTE — ED Provider Notes (Signed)
[facility] Emergency Department Provider Note   ____________________________________________   I have reviewed the triage vital signs and the nursing notes.   HISTORY  Chief Complaint Agitation   History limited by: Not Limited   HPI Jason [name] is a 67 y.o. male who presents to the emergency department today via EMS from his group home because of concerns for agitation.  Patient states he got upset with his roommate.  He does state that he knocked over a couple of chairs.  At the time my examination he states he feels less agitation.  He feels like if he had a different roommate he would be better.  He is wondering if some of his medication is wrong.  Apparently he was just released from [facility] and is unsure if they changed his medications.  He does state however he has been taking his medications as a given to him by group home members.  Patient denies any medical complaints.   Per medical record review patient has a history of paranoid schizophrenia.   Past Medical History:  Diagnosis Date  . Diabetes mellitus without complication ([facility])   . GERD (gastroesophageal reflux disease)   . Hypertension   . Paranoid schizophrenia ([facility])   . Tourette disorder     Patient Active Problem List   Diagnosis Date Noted  . Paranoid schizophrenia ([facility]) 07/23/2018  . Diabetes mellitus without complication ([facility]) 07/23/2018  . Tourette disorder 07/23/2018    Past Surgical History:  Procedure Laterality Date  . CARDIAC SURGERY    . HAND SURGERY    . TONSILLECTOMY      Prior to Admission medications   Not on File    Allergies Keflex [cephalexin]  History reviewed. No pertinent family history.  Social History Social History   Tobacco Use  . Smoking status: Not on file  Substance Use Topics  . Alcohol use: Not Currently  . Drug use: Not Currently    Review of Systems Constitutional: No fever/chills Eyes: No visual changes. ENT: No sore  throat. Cardiovascular: Denies chest pain. Respiratory: Denies shortness of breath. Gastrointestinal: No abdominal pain.  No nausea, no vomiting.  No diarrhea.   Genitourinary: Negative for dysuria. Musculoskeletal: Negative for back pain. Skin: Negative for rash. Neurological: Negative for headaches, focal weakness or numbness.  ____________________________________________   PHYSICAL EXAM:  VITAL SIGNS: ED Triage Vitals  Enc Vitals Group     BP 08/01/18 1933 (!) 150/79     Pulse Rate 08/01/18 1933 82     Resp 08/01/18 1933 17     Temp 08/01/18 1933 98.5 F (36.9 C)     Temp Source 08/01/18 1933 Oral     SpO2 08/01/18 1933 97 %     Weight 08/01/18 1935 292 lb (132.5 kg)     Height 08/01/18 1935 6' (1.829 m)   Constitutional: Alert and oriented.  Eyes: Conjunctivae are normal.  ENT      Head: Normocephalic and atraumatic.      Nose: No congestion/rhinnorhea.      Mouth/Throat: Mucous membranes are moist.      Neck: No stridor. Hematological/Lymphatic/Immunilogical: No cervical lymphadenopathy. Cardiovascular: Normal rate, regular rhythm.  No murmurs, rubs, or gallops.  Respiratory: Normal respiratory effort without tachypnea nor retractions. Breath sounds are clear and equal bilaterally. No wheezes/rales/rhonchi. Gastrointestinal: Soft and non tender. No rebound. No guarding.  Genitourinary: Deferred Musculoskeletal: Normal range of motion in all extremities. No lower extremity edema. Neurologic:  Normal speech and language. No gross  focal neurologic deficits are appreciated.  Skin:  Skin is warm, dry and intact. No rash noted. Psychiatric: Mood and affect are normal. Speech and behavior are normal. Patient exhibits appropriate insight and judgment.  ____________________________________________    LABS (pertinent positives/negatives)  None  ____________________________________________   EKG  None  ____________________________________________     RADIOLOGY  None  ____________________________________________   PROCEDURES  Procedures  ____________________________________________   INITIAL IMPRESSION / ASSESSMENT AND PLAN / ED COURSE  Pertinent labs & imaging results that were available during my care of the patient were reviewed by me and considered in my medical decision making (see chart for details).   Patient presented to the emergency department today because of concerns for agitation at his group home.  On exam here patient is calm.  Patient does have some concern it could be related to medications.  Will have specialist on-call evaluate.   ____________________________________________   FINAL CLINICAL IMPRESSION(S) / ED DIAGNOSES  Final diagnoses:  Agitation     Note: This dictation was prepared with Dragon dictation. Any transcriptional errors that result from this process are unintentional     Phineas SemenGoodman, Aashir Umholtz, MD 08/02/18 [phone]

## 2018-08-01 NOTE — ED Notes (Signed)
Pt. States "My roommate got me upset" "I knocked over some chair".  I [facility] a [facility] and I don't like it.  Pt. States [facility].  Pt. Asked "[facility]"   Pt. States "I had thoughts of hurting myself, I no longer have those feelings".  Pt. Denies SI and HI.

## 2018-08-02 NOTE — Progress Notes (Signed)
LCSW received text message to send patient by [employer] [place]anda Ray will pay for cab and she requested that patient be sent home.   LCSW informed ED nurse and ED secretary and Charge nurse. No further involvement required  Angela Platner LCSW

## 2018-08-02 NOTE — ED Notes (Signed)
[facility] at [phone].  Caller stated that I needed to call the administrator because the patient had to be taken away from the facility by the police.  Called administrator Jimmye NormanLawanda Ray, at [phone], awaiting a return call.

## 2018-08-02 NOTE — ED Notes (Signed)
Pt. Asked if this nurse could [facility] him due to his hearing impairment.  This Clinical research associatewriter and SOC were ok with this arrangement.

## 2018-08-02 NOTE — Progress Notes (Signed)
LCSW called and texted Brock BadLuwanda Ray and she has not responded to any called from ed secretary or edrn or this worker ED Charge was notified.  [employer] Air LinesClaudine Ercelle Winkles LCSW [phone]

## 2018-08-02 NOTE — Discharge Instructions (Addendum)
You have been seen in the Emergency Department (ED) today for a psychiatric complaint.  You have been evaluated by psychiatry and we believe you are safe to be discharged from the hospital.   There was no recommendation to changes to your medications.  Please return to the ED immediately if you have ANY thoughts of hurting yourself or anyone else, so that we may help you.  Please avoid alcohol and drug use.  Follow up with your doctor and/or therapist as soon as possible regarding today's ED visit.   Please follow up any other recommendations and clinic appointments provided by the psychiatry team that saw you in the Emergency Department.

## 2018-08-02 NOTE — Progress Notes (Addendum)
LCSW called [facility] and advised them the patient is discharged and will need to be picked up. They reported we would need to call Annie ParasLuwanda Rya [phone] called and left detailed message and advised ED RN awaiting call back  LCSW was advised the guardian has been notified patient is discharged from hospital called and left message for [place] [facility] [facility] [phone] unable to leave a message.  [employer] LinesClaudine Vedh Ptacek LCSW [phone]

## 2018-08-02 NOTE — ED Notes (Signed)
[facility] @ [phone] talked to an employee who said she would have to talk to supervisor and to call back in 10 minutes.

## 2018-08-02 NOTE — ED Provider Notes (Signed)
-----------------------------------------   4:07 AM on 08/02/2018 -----------------------------------------   Blood pressure (!) 150/79, pulse 82, temperature 98.5 F (36.9 C), temperature source Oral, resp. rate 17, height 1.829 m (6'), weight 132.5 kg, SpO2 97 %.  The patient had no acute events since last update.  Calm and cooperative at this time.  I reviewed the overall impression and recommendations from the tele-psychiatry evaluation and they feel he does not meet involuntary commitment criteria nor inpatient treatment criteria.  They have rescinded his involuntary commitment and recommend continuing on his current medications and following up as an outpatient with his psychiatrist.  His legal guardian will be informed of the plan by the patient's nurse.  I have prepared his discharge paperwork.   Loleta RoseForbach, Rhen Dossantos, MD 08/02/18 [phone]

## 2018-08-02 NOTE — ED Notes (Signed)
Pt. Requested a diet soda and was given drink.

## 2018-08-02 NOTE — ED Notes (Signed)
SOC called report given. 

## 2018-08-02 NOTE — ED Notes (Signed)
[facility] @ [phone] no answer.

## 2018-08-02 NOTE — ED Notes (Signed)
Called Guardian Drue Flirt([place] [facility])  [phone] [facility].  [facility] is not open for the weekend.  Was unable to leave a voicemail message at this phone number.

## 2018-08-02 NOTE — ED Notes (Signed)
Pt discharged via [employer] back to [facility].  Facility administrator, Jimmye NormanLawanda Ray, stated to send pt back to facility via cab services to Claudine in Child psychotherapistsocial worker.  Attempted to contact pt's guardian, but guardian is unreachable.  Pt given discharge instructions and verbalizes understanding.

## 2018-08-02 NOTE — ED Notes (Signed)
Called Guardian Drue Flirt([place])  [phone] from [facility].  [facility] is not open for the weekend.

## 2018-08-06 ENCOUNTER — Emergency Department
Admission: EM | Admit: 2018-08-06 | Discharge: 2018-08-07 | Disposition: A | Discharge status: Home / Self Care | Payer: Medicare Other | Attending: Emergency Medicine | Admitting: Emergency Medicine

## 2018-08-06 ENCOUNTER — Other Ambulatory Visit: Payer: Self-pay

## 2018-08-06 ENCOUNTER — Encounter: Payer: Self-pay | Admitting: Emergency Medicine

## 2018-08-06 DIAGNOSIS — R739 Hyperglycemia, unspecified: Secondary | ICD-10-CM

## 2018-08-06 DIAGNOSIS — F200 Paranoid schizophrenia: Secondary | ICD-10-CM | POA: Diagnosis present

## 2018-08-06 DIAGNOSIS — E1165 Type 2 diabetes mellitus with hyperglycemia: Secondary | ICD-10-CM | POA: Insufficient documentation

## 2018-08-06 DIAGNOSIS — I10 Essential (primary) hypertension: Secondary | ICD-10-CM

## 2018-08-06 DIAGNOSIS — R490 Dysphonia: Secondary | ICD-10-CM | POA: Diagnosis present

## 2018-08-06 DIAGNOSIS — E119 Type 2 diabetes mellitus without complications: Secondary | ICD-10-CM

## 2018-08-06 DIAGNOSIS — Z7982 Long term (current) use of aspirin: Secondary | ICD-10-CM | POA: Insufficient documentation

## 2018-08-06 DIAGNOSIS — F952 Tourette's disorder: Secondary | ICD-10-CM | POA: Diagnosis present

## 2018-08-06 DIAGNOSIS — Z7902 Long term (current) use of antithrombotics/antiplatelets: Secondary | ICD-10-CM | POA: Insufficient documentation

## 2018-08-06 DIAGNOSIS — R45851 Suicidal ideations: Secondary | ICD-10-CM | POA: Diagnosis not present

## 2018-08-06 DIAGNOSIS — Z79899 Other long term (current) drug therapy: Secondary | ICD-10-CM | POA: Diagnosis not present

## 2018-08-06 DIAGNOSIS — Z765 Malingerer [conscious simulation]: Secondary | ICD-10-CM | POA: Diagnosis not present

## 2018-08-06 DIAGNOSIS — Z794 Long term (current) use of insulin: Secondary | ICD-10-CM | POA: Diagnosis not present

## 2018-08-06 DIAGNOSIS — E079 Disorder of thyroid, unspecified: Secondary | ICD-10-CM

## 2018-08-06 DIAGNOSIS — F209 Schizophrenia, unspecified: Secondary | ICD-10-CM

## 2018-08-06 DIAGNOSIS — N289 Disorder of kidney and ureter, unspecified: Secondary | ICD-10-CM | POA: Insufficient documentation

## 2018-08-06 DIAGNOSIS — F79 Unspecified intellectual disabilities: Secondary | ICD-10-CM

## 2018-08-06 LAB — GLUCOSE, CAPILLARY: Glucose-Capillary: 339 mg/dL — ABNORMAL HIGH (ref 70–99)

## 2018-08-06 LAB — COMPREHENSIVE METABOLIC PANEL
ALK PHOS: 70 U/L (ref 38–126)
ALT: 24 U/L (ref 0–44)
AST: 23 U/L (ref 15–41)
Albumin: 4.5 g/dL (ref 3.5–5.0)
Anion gap: 15 (ref 5–15)
BILIRUBIN TOTAL: 0.7 mg/dL (ref 0.3–1.2)
BUN: 39 mg/dL — ABNORMAL HIGH (ref 8–23)
CALCIUM: 9.4 mg/dL (ref 8.9–10.3)
CO2: 28 mmol/L (ref 22–32)
CREATININE: 1.6 mg/dL — AB (ref 0.61–1.24)
Chloride: 91 mmol/L — ABNORMAL LOW (ref 98–111)
GFR calc Af Amer: 51 mL/min — ABNORMAL LOW (ref >60)
GFR, EST NON AFRICAN AMERICAN: 44 mL/min — AB (ref >60)
Glucose, Bld: 317 mg/dL — ABNORMAL HIGH (ref 70–99)
Potassium: 3.1 mmol/L — ABNORMAL LOW (ref 3.5–5.1)
Sodium: 134 mmol/L — ABNORMAL LOW (ref 135–145)
Total Protein: 7.9 g/dL (ref 6.5–8.1)

## 2018-08-06 LAB — URINE DRUG SCREEN, QUALITATIVE (ARMC ONLY)
Amphetamines, Ur Screen: NOT DETECTED
BARBITURATES, UR SCREEN: NOT DETECTED
Benzodiazepine, Ur Scrn: NOT DETECTED
CANNABINOID 50 NG, UR ~~LOC~~: NOT DETECTED
COCAINE METABOLITE, UR ~~LOC~~: NOT DETECTED
MDMA (Ecstasy)Ur Screen: NOT DETECTED
Methadone Scn, Ur: NOT DETECTED
Opiate, Ur Screen: NOT DETECTED
PHENCYCLIDINE (PCP) UR S: NOT DETECTED
TRICYCLIC, UR SCREEN: NOT DETECTED

## 2018-08-06 LAB — CBC
HCT: 37.3 % — ABNORMAL LOW (ref 40.0–52.0)
Hemoglobin: 13 g/dL (ref 13.0–18.0)
MCH: 28.4 pg (ref 26.0–34.0)
MCHC: 35 g/dL (ref 32.0–36.0)
MCV: 81.3 fL (ref 80.0–100.0)
PLATELETS: 250 10*3/uL (ref 150–440)
RBC: 4.59 MIL/uL (ref 4.40–5.90)
RDW: 16.4 % — ABNORMAL HIGH (ref 11.5–14.5)
WBC: 9.7 10*3/uL (ref 3.8–10.6)

## 2018-08-06 LAB — ACETAMINOPHEN LEVEL: Acetaminophen (Tylenol), Serum: <10 ug/mL — ABNORMAL LOW (ref 10–30)

## 2018-08-06 LAB — SALICYLATE LEVEL

## 2018-08-06 LAB — ETHANOL

## 2018-08-06 MED ORDER — ASPIRIN EC 325 MG PO TBEC
DELAYED_RELEASE_TABLET | ORAL | Status: AC
  Filled 2018-08-06: qty 1

## 2018-08-06 MED ORDER — ATORVASTATIN CALCIUM 20 MG PO TABS
80.0000 mg | ORAL_TABLET | Freq: Every day | ORAL | Status: DC
  Administered 2018-08-06 – 2018-08-07 (×2): 80 mg via ORAL
  Filled 2018-08-06 (×2): qty 4

## 2018-08-06 MED ORDER — FUROSEMIDE 40 MG PO TABS
40.0000 mg | ORAL_TABLET | Freq: Every evening | ORAL | Status: DC
  Administered 2018-08-06 – 2018-08-07 (×2): 40 mg via ORAL
  Filled 2018-08-06 (×2): qty 1

## 2018-08-06 MED ORDER — PIMOZIDE 2 MG PO TABS
1.0000 mg | ORAL_TABLET | Freq: Two times a day (BID) | ORAL | Status: DC
  Administered 2018-08-07: 1 mg via ORAL
  Filled 2018-08-06 (×3): qty 1

## 2018-08-06 MED ORDER — ISOSORBIDE MONONITRATE ER 60 MG PO TB24
60.0000 mg | ORAL_TABLET | Freq: Every day | ORAL | Status: DC
  Administered 2018-08-06 – 2018-08-07 (×2): 60 mg via ORAL
  Filled 2018-08-06 (×2): qty 1

## 2018-08-06 MED ORDER — RISPERIDONE 1 MG PO TABS
0.5000 mg | ORAL_TABLET | Freq: Two times a day (BID) | ORAL | Status: DC
  Administered 2018-08-06 – 2018-08-07 (×2): 0.5 mg via ORAL
  Filled 2018-08-06 (×2): qty 1

## 2018-08-06 MED ORDER — BENZTROPINE MESYLATE 1 MG PO TABS
0.5000 mg | ORAL_TABLET | Freq: Two times a day (BID) | ORAL | Status: DC
  Administered 2018-08-06 – 2018-08-07 (×2): 0.5 mg via ORAL
  Filled 2018-08-06 (×2): qty 1

## 2018-08-06 MED ORDER — DIVALPROEX SODIUM ER 250 MG PO TB24
1000.0000 mg | ORAL_TABLET | Freq: Every day | ORAL | Status: DC
  Administered 2018-08-06 – 2018-08-07 (×2): 1000 mg via ORAL
  Filled 2018-08-06 (×2): qty 4

## 2018-08-06 MED ORDER — FUROSEMIDE 40 MG PO TABS
40.0000 mg | ORAL_TABLET | Freq: Every day | ORAL | Status: DC
  Administered 2018-08-07: 40 mg via ORAL
  Filled 2018-08-06: qty 1

## 2018-08-06 MED ORDER — INSULIN GLARGINE 100 UNIT/ML ~~LOC~~ SOLN
28.0000 [IU] | Freq: Every day | SUBCUTANEOUS | Status: DC
  Administered 2018-08-07: 28 [IU] via SUBCUTANEOUS
  Filled 2018-08-06 (×2): qty 0.28

## 2018-08-06 MED ORDER — AMLODIPINE BESYLATE 5 MG PO TABS
10.0000 mg | ORAL_TABLET | Freq: Every day | ORAL | Status: DC
  Administered 2018-08-06 – 2018-08-07 (×2): 10 mg via ORAL
  Filled 2018-08-06 (×2): qty 2

## 2018-08-06 MED ORDER — CLOPIDOGREL BISULFATE 75 MG PO TABS
75.0000 mg | ORAL_TABLET | Freq: Every day | ORAL | Status: DC
  Administered 2018-08-06 – 2018-08-07 (×2): 75 mg via ORAL
  Filled 2018-08-06 (×2): qty 1

## 2018-08-06 MED ORDER — POTASSIUM CHLORIDE CRYS ER 20 MEQ PO TBCR
40.0000 meq | EXTENDED_RELEASE_TABLET | Freq: Once | ORAL | Status: AC
  Administered 2018-08-06: 40 meq via ORAL
  Filled 2018-08-06: qty 2

## 2018-08-06 MED ORDER — METOLAZONE 2.5 MG PO TABS
2.5000 mg | ORAL_TABLET | Freq: Every day | ORAL | Status: DC
  Administered 2018-08-07: 2.5 mg via ORAL
  Filled 2018-08-06 (×2): qty 1

## 2018-08-06 MED ORDER — ASPIRIN 325 MG PO TABS
325.0000 mg | ORAL_TABLET | Freq: Every day | ORAL | Status: DC
  Administered 2018-08-07: 325 mg via ORAL
  Filled 2018-08-06: qty 1

## 2018-08-06 MED ORDER — CARVEDILOL 6.25 MG PO TABS
6.2500 mg | ORAL_TABLET | Freq: Two times a day (BID) | ORAL | Status: DC
  Administered 2018-08-07 (×2): 6.25 mg via ORAL
  Filled 2018-08-06 (×2): qty 1

## 2018-08-06 MED ORDER — TAMSULOSIN HCL 0.4 MG PO CAPS
0.4000 mg | ORAL_CAPSULE | Freq: Every day | ORAL | Status: DC
  Administered 2018-08-07: 0.4 mg via ORAL
  Filled 2018-08-06: qty 1

## 2018-08-06 MED ORDER — GLIPIZIDE 5 MG PO TABS
5.0000 mg | ORAL_TABLET | Freq: Every day | ORAL | Status: DC
  Administered 2018-08-07: 5 mg via ORAL
  Filled 2018-08-06 (×2): qty 1

## 2018-08-06 MED ORDER — POTASSIUM CHLORIDE CRYS ER 20 MEQ PO TBCR
10.0000 meq | EXTENDED_RELEASE_TABLET | Freq: Two times a day (BID) | ORAL | Status: DC
  Administered 2018-08-06 – 2018-08-07 (×2): 10 meq via ORAL
  Filled 2018-08-06 (×2): qty 1

## 2018-08-06 MED ORDER — INSULIN GLARGINE 100 UNIT/ML ~~LOC~~ SOLN
100.0000 [IU] | Freq: Every day | SUBCUTANEOUS | Status: DC
  Administered 2018-08-06: 100 [IU] via SUBCUTANEOUS
  Filled 2018-08-06 (×2): qty 1

## 2018-08-06 MED ORDER — METFORMIN HCL 500 MG PO TABS
1000.0000 mg | ORAL_TABLET | Freq: Two times a day (BID) | ORAL | Status: DC
  Administered 2018-08-06 – 2018-08-07 (×3): 1000 mg via ORAL
  Filled 2018-08-06 (×3): qty 2

## 2018-08-06 MED ORDER — GABAPENTIN 300 MG PO CAPS
300.0000 mg | ORAL_CAPSULE | Freq: Three times a day (TID) | ORAL | Status: DC
  Administered 2018-08-06 – 2018-08-07 (×3): 300 mg via ORAL
  Filled 2018-08-06 (×5): qty 1

## 2018-08-06 MED ORDER — INSULIN LISPRO 100 UNIT/ML ~~LOC~~ SOLN: 57.0000 [IU] | Freq: Three times a day (TID) | SUBCUTANEOUS | Status: DC

## 2018-08-06 NOTE — ED Notes (Signed)
Awaiting insulin from pharmacy

## 2018-08-06 NOTE — ED Notes (Signed)
Insulin requested from pharmacy.

## 2018-08-06 NOTE — ED Provider Notes (Signed)
[facility] Emergency Department Provider Note  ____________________________________________  Time seen: Approximately 5:53 PM  I have reviewed the triage vital signs and the nursing notes.   HISTORY  Chief Complaint Suicidal    HPI Jason [name] is a 67 y.o. male with Tourette's disorder, paranoid schizophrenia, HTN, DM, presenting for suicidal ideations.  The patient reports that while at his group home, he was having suicidal ideations because he hates it there.  He states that he left to go to [facility], and the suicidal thoughts stop.  He is not having them now but states that if he returns to the group home, he will start having them again and that he plans to kill himself.  He does not have a plan.  He denies any homicidal ideations.  He has auditory hallucinations at baseline which are unchanged.  Past Medical History:  Diagnosis Date  . Diabetes mellitus without complication (HCC)   . GERD (gastroesophageal reflux disease)   . Hypertension   . Paranoid schizophrenia (HCC)   . Tourette disorder     Patient Active Problem List   Diagnosis Date Noted  . Paranoid schizophrenia (HCC) 07/23/2018  . Diabetes mellitus without complication (HCC) 07/23/2018  . Tourette disorder 07/23/2018    Past Surgical History:  Procedure Laterality Date  . CARDIAC SURGERY    . HAND SURGERY    . TONSILLECTOMY      Current Outpatient Rx  . Order #: [id number] Class: Historical Med  . Order #: [id number] Class: Historical Med  . Order #: [id number] Class: Historical Med  . Order #: [id number] Class: Historical Med  . Order #: [id number] Class: Historical Med  . Order #: [id number] Class: Historical Med  . Order #: [id number] Class: Historical Med  . Order #: [id number] Class: Historical Med  . Order #: [id number] Class: Historical Med  . Order #: [id number] Class: Historical Med  . Order #: [id number] Class: Historical Med  . Order #: [id number] Class: Historical Med  . Order #:  [id number] Class: Historical Med  . Order #: [id number] Class: Historical Med  . Order #: [id number] Class: Historical Med  . Order #: [id number] Class: Historical Med  . Order #: [id number] Class: Historical Med  . Order #: [id number] Class: Historical Med  . Order #: [id number] Class: Historical Med  . Order #: [id number] Class: Historical Med  . Order #: [id number] Class: Historical Med  . Order #: [id number] Class: Historical Med  . Order #: [id number] Class: Historical Med  . Order #: [id number] Class: Historical Med  . Order #: [id number] Class: Historical Med  . Order #: [id number] Class: Historical Med  . Order #: [id number] Class: Historical Med  . Order #: [id number] Class: Historical Med    Allergies Keflex [cephalexin]  No family history on file.  Social History Social History   Tobacco Use  . Smoking status: Not on file  Substance Use Topics  . Alcohol use: Not Currently  . Drug use: Not Currently    Review of Systems Constitutional: No fever/chills. Eyes: No visual changes. ENT: No sore throat. No congestion or rhinorrhea. Cardiovascular: Denies chest pain. Denies palpitations. Respiratory: Denies shortness of breath.  No cough. Gastrointestinal: No abdominal pain.  No nausea, no vomiting.  No diarrhea.  No constipation. Genitourinary: Negative for dysuria. Musculoskeletal: Negative for back pain. Skin: Negative for rash. Neurological: Negative for headaches. No focal numbness, tingling or weakness.  Positive Tourette's, unchanged. Psychiatric:Positive unchanged auditory hallucinations.  Positive suicidal ideations without plan.  Negative homicidal ideations peer   ____________________________________________   PHYSICAL EXAM:  VITAL SIGNS: ED Triage Vitals  Enc Vitals Group  BP 08/06/18 1724 115/86     Pulse Rate 08/06/18 1720 82     Resp 08/06/18 1720 16     Temp 08/06/18 1720 98 F (36.7 C)     Temp Source 08/06/18 1720 Oral     SpO2 08/06/18 1720 98 %     Weight  08/06/18 1721 280 lb (127 kg)     Height 08/06/18 1721 6' (1.829 m)     Head Circumference --      Peak Flow --      Pain Score 08/06/18 1721 0     Pain Loc --      Pain Edu? --      Excl. in GC? --     Constitutional: Alert and oriented. Answers questions appropriately.  Chronically ill-appearing. Eyes: Conjunctivae are normal.  EOMI. No scleral icterus. Head: Atraumatic. Nose: No congestion/rhinnorhea. Mouth/Throat: Mucous membranes are moist.  Neck: No stridor.  Supple.  No meningismus. Cardiovascular: Normal rate, regular rhythm. No murmurs, rubs or gallops.  Respiratory: Normal respiratory effort.  No accessory muscle use or retractions. Lungs CTAB.  No wheezes, rales or ronchi. Gastrointestinal: Obese.  Soft, nontender and nondistended.  No guarding or rebound.  No peritoneal signs. Musculoskeletal: Moves all extremities well. Neurologic:  A&Ox3.  Speech is clear.  Face and smile are symmetric.  EOMI.  Moves all extremities well. Skin:  Skin is warm, dry and intact. No rash noted. Psychiatric: Depressed mood and flat affect.  Patient does not have pressured speech.  He is calm and cooperative.  ____________________________________________   LABS (all labs ordered are listed, but only abnormal results are displayed)  Labs Reviewed  COMPREHENSIVE METABOLIC PANEL - Abnormal; Notable for the following components:      Result Value   Sodium 134 (*)    Potassium 3.1 (*)    Chloride 91 (*)    Glucose, Bld 317 (*)    BUN 39 (*)    Creatinine, Ser 1.60 (*)    GFR calc non Af Amer 44 (*)    GFR calc Af Amer 51 (*)    All other components within normal limits  ACETAMINOPHEN LEVEL - Abnormal; Notable for the following components:   Acetaminophen (Tylenol), Serum <10 (*)    All other components within normal limits  CBC - Abnormal; Notable for the following components:   HCT 37.3 (*)    RDW 16.4 (*)    All other components within normal limits  ETHANOL  SALICYLATE LEVEL   URINE DRUG SCREEN, QUALITATIVE ([facility] ONLY)  CBG MONITORING, ED   ____________________________________________  EKG  Not indicated ____________________________________________  RADIOLOGY  No results found.  ____________________________________________   PROCEDURES  Procedure(s) performed: None  Procedures  Critical Care performed: No ____________________________________________   INITIAL IMPRESSION / ASSESSMENT AND PLAN / ED COURSE  Pertinent labs & imaging results that were available during my care of the patient were reviewed by me and considered in my medical decision making (see chart for details).  68 y.o. male with a history of schizoaffective schizophrenia presenting for suicidal ideations that occur only when he is at his group home because he hates his group home.  Overall, the patient is hemodynamically stable and has multiple chronic medical comorbidities but no acute complaints today.  I have reordered all of his home medications.  A TTS consult and psychiatry consult have been placed and the patient has been placed under involuntary commitment.  Plan medical clearance for psychiatric disposition.  ----------------------------------------- 7:31 PM on 08/06/2018 -----------------------------------------  The patient's laboratory studies do show hyperglycemia without DKA, some hypokalemia, and some renal insufficiency.  The patient will drink plenty of fluids to help with these abnormalities, get supplemental potassium, and receive insulin for his hyperglycemia.  ____________________________________________  FINAL CLINICAL IMPRESSION(S) / ED DIAGNOSES  Final diagnoses:  Suicidal ideation  Hyperglycemia  Renal insufficiency         NEW MEDICATIONS STARTED DURING THIS VISIT:  New Prescriptions   No medications on file      Rockne MenghiniNorman, Anne-Caroline, MD 08/06/18 1932

## 2018-08-06 NOTE — ED Triage Notes (Signed)
Pt to ED via [facility] under IVC from [facility] for SI and hallucinations. Pt told [facility] that he had plan and intent to commit suicide. Pt states that he started having these thoughts last night. Pt also states that he was hearing and seeing things that were telling him to harm himself. Pt states that being at the group home he is in makes him feel depressed but that he feels better when he is not here. Pt is calm and cooperative in triage at this time.

## 2018-08-07 DIAGNOSIS — I10 Essential (primary) hypertension: Secondary | ICD-10-CM

## 2018-08-07 DIAGNOSIS — Z765 Malingerer [conscious simulation]: Secondary | ICD-10-CM

## 2018-08-07 DIAGNOSIS — F79 Unspecified intellectual disabilities: Secondary | ICD-10-CM

## 2018-08-07 DIAGNOSIS — F200 Paranoid schizophrenia: Secondary | ICD-10-CM | POA: Diagnosis not present

## 2018-08-07 LAB — GLUCOSE, CAPILLARY
GLUCOSE-CAPILLARY: 261 mg/dL — AB (ref 70–99)
GLUCOSE-CAPILLARY: 287 mg/dL — AB (ref 70–99)
GLUCOSE-CAPILLARY: 188 mg/dL — AB (ref 70–99)
Glucose-Capillary: 64 mg/dL — ABNORMAL LOW (ref 70–99)
Glucose-Capillary: 127 mg/dL — ABNORMAL HIGH (ref 70–99)
Glucose-Capillary: 218 mg/dL — ABNORMAL HIGH (ref 70–99)

## 2018-08-07 MED ORDER — INSULIN ASPART 100 UNIT/ML ~~LOC~~ SOLN
57.0000 [IU] | Freq: Three times a day (TID) | SUBCUTANEOUS | Status: DC
  Administered 2018-08-07 (×2): 57 [IU] via SUBCUTANEOUS
  Filled 2018-08-07 (×2): qty 1

## 2018-08-07 NOTE — ED Notes (Signed)
Dr. Toni Amendlapacs at bedside to assess patient.  Will continue to monitor.

## 2018-08-07 NOTE — ED Notes (Signed)
Spoke to [employer] HoldingsLawanda Ray at patient's group home.  She states patient can come back to group home by [employer] with his paperwork in hand.

## 2018-08-07 NOTE — Consult Note (Signed)
[facility] Face-to-Face Psychiatry Consult   Reason for Consult: Consult for this 67 year old man who is chronically disabled who was brought here because he was saying that he was suicidal Referring Physician: Quentin Cornwall Patient Identification: NAZIAH WECKERLY MRN:  [id number] Principal Diagnosis: Malingering Diagnosis:   Patient Active Problem List   Diagnosis Date Noted  . Hypertension [I10] 08/07/2018  . Malingering [Z76.5] 08/07/2018  . Intellectual disability [F79] 08/07/2018  . Paranoid schizophrenia ([place]) [F20.0] 07/23/2018  . Diabetes mellitus without complication ([place]) [V37.1] 07/23/2018  . Tourette disorder [F95.2] 07/23/2018    Total Time spent with patient: 1 hour  Subjective:   Pal A Patchell is a 67 y.o. male patient admitted with "if you send me back to that group home I will lay down in the street".  HPI: Patient seen chart reviewed.  Patient came here from his group home.  He said he got into an argument with people there and they were making him angry so he told him that he was going to kill himself.  He walked away from the place and found a telephone and called 911 himself.  Patient did not evidently actually do anything to hurt himself.  He is telling me today that he does not want to go back to that group home and instead has several other facilities that he specifically request to go to.  He says that if he goes back to his group home he will lay down in the street so that a car can run over him.  Patient is claiming to have hallucinations and when I asked him to describe them says that he is seeing cowboys and Indians here in the emergency room.  Patient has been calm except when engaged in conversation about disposition.  Otherwise lucid and able to request specific things.  Not acting out to hurt himself.  In the course of conversation it is clear that he does not want to die but actually wants to have specific arrangements made for his living space because he dislikes how  things are being done at his current group home.  Social history: Patient has a legal guardian.  It looks like he has a chronic history of bouncing from place to place because of exactly this kind of behavior.  Medical history: Multiple medical problems including diabetes with elevated blood sugars and hypertension.  Substance abuse history: Denies alcohol or drug abuse history no recent substance abuse  Past Psychiatric History: Diagnosis of schizophrenia on the chart.  Current symptoms are not typical of acute schizophrenia.  Review of old notes makes it clear that the patient frequently uses the gambit of claiming to be suicidal when he dislikes something at the group home so that he can try to get into a hospital and argue for a different placement.  He has never actually been run over by a car and it looks like the worst he is ever done was to superficially cut himself.  Risk to Self:   Risk to Others:   Prior Inpatient Therapy:   Prior Outpatient Therapy:    Past Medical History:  Past Medical History:  Diagnosis Date  . Diabetes mellitus without complication ([place])   . GERD (gastroesophageal reflux disease)   . Hypertension   . Paranoid schizophrenia ([place])   . Tourette disorder     Past Surgical History:  Procedure Laterality Date  . CARDIAC SURGERY    . HAND SURGERY    . TONSILLECTOMY     Family History: No  family history on file. Family Psychiatric  History: None known Social History:  Social History   Substance and Sexual Activity  Alcohol Use Not Currently     Social History   Substance and Sexual Activity  Drug Use Not Currently    Social History   Socioeconomic History  . Marital status: Single    Spouse name: Not on file  . Number of children: Not on file  . Years of education: Not on file  . Highest education level: Not on file  Occupational History  . Not on file  Social Needs  . Financial resource strain: Not on file  . Food insecurity:     Worry: Not on file    Inability: Not on file  . Transportation needs:    Medical: Not on file    Non-medical: Not on file  Tobacco Use  . Smoking status: Not on file  Substance and Sexual Activity  . Alcohol use: Not Currently  . Drug use: Not Currently  . Sexual activity: Not Currently  Lifestyle  . Physical activity:    Days per week: Not on file    Minutes per session: Not on file  . Stress: Not on file  Relationships  . Social connections:    Talks on phone: Not on file    Gets together: Not on file    Attends religious service: Not on file    Active member of club or organization: Not on file    Attends meetings of clubs or organizations: Not on file    Relationship status: Not on file  Other Topics Concern  . Not on file  Social History Narrative  . Not on file   Additional Social History:    Allergies:   Allergies  Allergen Reactions  . Keflex [Cephalexin] Rash    Labs:  Results for orders placed or performed during the hospital encounter of 08/06/18 (from the past 48 hour(s))  Comprehensive metabolic panel     Status: Abnormal   Collection Time: 08/06/18  5:25 PM  Result Value Ref Range   Sodium 134 (L) 135 - 145 mmol/L   Potassium 3.1 (L) 3.5 - 5.1 mmol/L   Chloride 91 (L) 98 - 111 mmol/L   CO2 28 22 - 32 mmol/L   Glucose, Bld 317 (H) 70 - 99 mg/dL   BUN 39 (H) 8 - 23 mg/dL   Creatinine, Ser 1.60 (H) 0.61 - 1.24 mg/dL   Calcium 9.4 8.9 - 10.3 mg/dL   Total Protein 7.9 6.5 - 8.1 g/dL   Albumin 4.5 3.5 - 5.0 g/dL   AST 23 15 - 41 U/L   ALT 24 0 - 44 U/L   Alkaline Phosphatase 70 38 - 126 U/L   Total Bilirubin 0.7 0.3 - 1.2 mg/dL   GFR calc non Af Amer 44 (L) >60 mL/min   GFR calc Af Amer 51 (L) >60 mL/min    Comment: (NOTE) The eGFR has been calculated using the CKD EPI equation. This calculation has not been validated in all clinical situations. eGFR's persistently <60 mL/min signify possible Chronic Kidney Disease.    Anion gap 15 5 - 15     Comment: Performed at [facility], [address] [place], [place] [address]  Ethanol     Status: None   Collection Time: 08/06/18  5:25 PM  Result Value Ref Range   Alcohol, Ethyl (B) <10 <10 mg/dL    Comment: (NOTE) Lowest detectable limit for serum alcohol is  10 mg/dL. For medical purposes only. Performed at [facility], [address] [place], [place] [address]   Salicylate level     Status: None   Collection Time: 08/06/18  5:25 PM  Result Value Ref Range   Salicylate Lvl <8.2 2.8 - 30.0 mg/dL    Comment: Performed at [facility], [address] [place], [place] [address]  Acetaminophen level     Status: Abnormal   Collection Time: 08/06/18  5:25 PM  Result Value Ref Range   Acetaminophen (Tylenol), Serum <10 (L) 10 - 30 ug/mL    Comment: (NOTE) Therapeutic concentrations vary significantly. A range of 10-30 ug/mL  may be an effective concentration for many patients. However, some  are best treated at concentrations outside of this range. Acetaminophen concentrations >150 ug/mL at 4 hours after ingestion  and >50 ug/mL at 12 hours after ingestion are often associated with  toxic reactions. Performed at [facility], [address] [place], [place] [address]   cbc     Status: Abnormal   Collection Time: 08/06/18  5:25 PM  Result Value Ref Range   WBC 9.7 3.8 - 10.6 K/uL   RBC 4.59 4.40 - 5.90 MIL/uL   Hemoglobin 13.0 13.0 - 18.0 g/dL   HCT 37.3 (L) 40.0 - 52.0 %   MCV 81.3 80.0 - 100.0 fL   MCH 28.4 26.0 - 34.0 pg   MCHC 35.0 32.0 - 36.0 g/dL   RDW 16.4 (H) 11.5 - 14.5 %   Platelets 250 150 - 440 K/uL    Comment: Performed at [facility], [address] [place], [place] [address]  Urine Drug Screen, Qualitative     Status: None   Collection Time: 08/06/18  5:25 PM  Result Value Ref Range   Tricyclic, Ur Screen NONE DETECTED NONE DETECTED   Amphetamines, Ur Screen NONE DETECTED NONE DETECTED   MDMA  (Ecstasy)Ur Screen NONE DETECTED NONE DETECTED   Cocaine Metabolite,Ur [place] NONE DETECTED NONE DETECTED   Opiate, Ur Screen NONE DETECTED NONE DETECTED   Phencyclidine (PCP) Ur S NONE DETECTED NONE DETECTED   Cannabinoid 50 Ng, Ur [place] NONE DETECTED NONE DETECTED   Barbiturates, Ur Screen NONE DETECTED NONE DETECTED   Benzodiazepine, Ur Scrn NONE DETECTED NONE DETECTED   Methadone Scn, Ur NONE DETECTED NONE DETECTED    Comment: (NOTE) Tricyclics + metabolites, urine    Cutoff 1000 ng/mL Amphetamines + metabolites, urine  Cutoff 1000 ng/mL MDMA (Ecstasy), urine              Cutoff 500 ng/mL Cocaine Metabolite, urine          Cutoff 300 ng/mL Opiate + metabolites, urine        Cutoff 300 ng/mL Phencyclidine (PCP), urine         Cutoff 25 ng/mL Cannabinoid, urine                 Cutoff 50 ng/mL Barbiturates + metabolites, urine  Cutoff 200 ng/mL Benzodiazepine, urine              Cutoff 200 ng/mL Methadone, urine                   Cutoff 300 ng/mL The urine drug screen provides only a preliminary, unconfirmed analytical test result and should not be used for non-medical purposes. Clinical consideration and professional judgment should be applied to any positive drug screen result due to possible interfering substances. A more specific alternate chemical  method must be used in order to obtain a confirmed analytical result. Gas chromatography / mass spectrometry (GC/MS) is the preferred confirmat ory method. Performed at [facility], [address] [place], [place] [address]   Glucose, capillary     Status: Abnormal   Collection Time: 08/06/18  9:23 PM  Result Value Ref Range   Glucose-Capillary 339 (H) 70 - 99 mg/dL  Glucose, capillary     Status: Abnormal   Collection Time: 08/07/18  7:51 AM  Result Value Ref Range   Glucose-Capillary 261 (H) 70 - 99 mg/dL  Glucose, capillary     Status: Abnormal   Collection Time: 08/07/18 10:05 AM  Result Value Ref Range    Glucose-Capillary 287 (H) 70 - 99 mg/dL  Glucose, capillary     Status: Abnormal   Collection Time: 08/07/18 11:57 AM  Result Value Ref Range   Glucose-Capillary 188 (H) 70 - 99 mg/dL    Current Facility-Administered Medications  Medication Dose Route Frequency Provider Last Rate Last Dose  . amLODipine (NORVASC) tablet 10 mg  10 mg Oral Daily Eula Listen, MD   10 mg at 08/07/18 1015  . aspirin tablet 325 mg  325 mg Oral Daily Eula Listen, MD   325 mg at 08/07/18 1013  . atorvastatin (LIPITOR) tablet 80 mg  80 mg Oral Daily Eula Listen, MD   80 mg at 08/07/18 1013  . benztropine (COGENTIN) tablet 0.5 mg  0.5 mg Oral BID Eula Listen, MD   0.5 mg at 08/07/18 1020  . carvedilol (COREG) tablet 6.25 mg  6.25 mg Oral BID WC Eula Listen, MD   6.25 mg at 08/07/18 0848  . clopidogrel (PLAVIX) tablet 75 mg  75 mg Oral Daily Eula Listen, MD   75 mg at 08/07/18 1013  . divalproex (DEPAKOTE ER) 24 hr tablet 1,000 mg  1,000 mg Oral Daily Eula Listen, MD   1,000 mg at 08/07/18 1016  . furosemide (LASIX) tablet 40 mg  40 mg Oral QPM Eula Listen, MD   40 mg at 08/06/18 1835  . furosemide (LASIX) tablet 40 mg  40 mg Oral Daily Eula Listen, MD   40 mg at 08/07/18 1020  . gabapentin (NEURONTIN) capsule 300 mg  300 mg Oral TID Eula Listen, MD   300 mg at 08/07/18 1539  . glipiZIDE (GLUCOTROL) tablet 5 mg  5 mg Oral QAC breakfast Eula Listen, MD   5 mg at 08/07/18 0848  . insulin aspart (novoLOG) injection 57 Units  57 Units Subcutaneous TID WC Eula Listen, MD   57 Units at 08/07/18 1203  . insulin glargine (LANTUS) injection 100 Units  100 Units Subcutaneous QHS Eula Listen, MD   100 Units at 08/06/18 2324  . insulin glargine (LANTUS) injection 28 Units  28 Units Subcutaneous Daily Eula Listen, MD   28 Units at 08/07/18 1024  . isosorbide mononitrate (IMDUR) 24 hr tablet 60 mg   60 mg Oral Daily Eula Listen, MD   60 mg at 08/07/18 1017  . metFORMIN (GLUCOPHAGE) tablet 1,000 mg  1,000 mg Oral BID WC Eula Listen, MD   1,000 mg at 08/07/18 0847  . metolazone (ZAROXOLYN) tablet 2.5 mg  2.5 mg Oral Daily Eula Listen, MD   2.5 mg at 08/07/18 1540  . pimozide (ORAP) tablet 1 mg  1 mg Oral BID Eula Listen, MD   1 mg at 08/07/18 1539  . potassium chloride SA (K-DUR,KLOR-CON) CR tablet 10 mEq  10 mEq Oral BID  Eula Listen, MD   10 mEq at 08/07/18 1017  . risperiDONE (RISPERDAL) tablet 0.5 mg  0.5 mg Oral BID Eula Listen, MD   0.5 mg at 08/07/18 1018  . tamsulosin (FLOMAX) capsule 0.4 mg  0.4 mg Oral QPC breakfast Eula Listen, MD   0.4 mg at 08/07/18 8756   Current Outpatient Medications  Medication Sig Dispense Refill  . DIALYVITE VITAMIN D3 MAX 43329 units TABS Take 50,000 Units by mouth every 7 (seven) days.  0  . acetaminophen (TYLENOL) 500 MG tablet Take 500 mg by mouth every 4 (four) hours as needed.    Marland Kitchen amLODipine (NORVASC) 10 MG tablet Take 10 mg by mouth daily.    Marland Kitchen aspirin 325 MG tablet Take 325 mg by mouth daily.    Marland Kitchen atorvastatin (LIPITOR) 80 MG tablet Take 80 mg by mouth daily.    . benztropine (COGENTIN) 0.5 MG tablet Take 0.5 mg by mouth 2 (two) times daily.    . carvedilol (COREG) 6.25 MG tablet Take 6.25 mg by mouth 2 (two) times daily with a meal.    . clopidogrel (PLAVIX) 75 MG tablet Take 75 mg by mouth daily.    . divalproex (DEPAKOTE ER) 500 MG 24 hr tablet Take 1,000 mg by mouth daily.    . furosemide (LASIX) 40 MG tablet Take 60 mg by mouth daily.    . furosemide (LASIX) 40 MG tablet Take 40 mg by mouth daily.    Marland Kitchen gabapentin (NEURONTIN) 300 MG capsule Take 300 mg by mouth 3 (three) times daily.    Marland Kitchen glipiZIDE (GLUCOTROL) 5 MG tablet Take 5 mg by mouth daily before breakfast.    . guaiFENesin (ROBITUSSIN) 100 MG/5ML SOLN Take 5 mLs by mouth every 4 (four) hours as needed for cough or  to loosen phlegm.    . insulin glargine (LANTUS) 100 UNIT/ML injection Inject 100 Units into the skin at bedtime.    . insulin glargine (LANTUS) 100 UNIT/ML injection Inject 28 Units into the skin daily.    . insulin lispro (HUMALOG) 100 UNIT/ML injection Inject 57 Units into the skin 3 (three) times daily before meals.    . isosorbide mononitrate (IMDUR) 60 MG 24 hr tablet Take 60 mg by mouth daily.    Marland Kitchen loperamide (IMODIUM) 2 MG capsule Take 2 mg by mouth as needed for diarrhea or loose stools.    . magnesium hydroxide (MILK OF MAGNESIA) 400 MG/5ML suspension Take 30 mLs by mouth daily as needed for mild constipation.    . metFORMIN (GLUCOPHAGE) 1000 MG tablet Take 1,000 mg by mouth 2 (two) times daily with a meal.    . metolazone (ZAROXOLYN) 2.5 MG tablet Take 2.5 mg by mouth daily.    Marland Kitchen neomycin-bacitracin-polymyxin (NEOSPORIN) [phone] ointment Apply 1 application topically as needed.    . nitroGLYCERIN (NITROSTAT) 0.4 MG SL tablet Place 0.4 mg under the tongue every 5 (five) minutes as needed for chest pain.    . Pimozide 1 MG TABS Take 1 mg by mouth 2 (two) times daily.    . Potassium Chloride CR (MICRO-K) 8 MEQ CPCR capsule CR Take 8 mEq by mouth 2 (two) times daily.    . risperiDONE (RISPERDAL) 1 MG tablet Take 0.5 mg by mouth 2 (two) times daily.    . tamsulosin (FLOMAX) 0.4 MG CAPS capsule Take 0.4 mg by mouth daily after breakfast.    . Vitamin D, Ergocalciferol, (DRISDOL) 50000 units CAPS capsule Take 50,000 Units by mouth every 7 (seven)  days.      Musculoskeletal: Strength & Muscle Tone: within normal limits Gait & Station: normal Patient leans: N/A  Psychiatric Specialty Exam: Physical Exam  Nursing note and vitals reviewed. Constitutional: He appears well-developed and well-nourished.  HENT:  Head: Normocephalic and atraumatic.  Eyes: Pupils are equal, round, and reactive to light. Conjunctivae are normal.  Neck: Normal range of motion.  Cardiovascular: Regular  rhythm and normal heart sounds.  Respiratory: Effort normal. No respiratory distress.  GI: Soft.  Musculoskeletal: Normal range of motion.  Neurological: He is alert.  Skin: Skin is warm and dry.     Psychiatric: His affect is blunt. His speech is tangential. He is not agitated, not aggressive, not hyperactive and not combative. Thought content is not paranoid and not delusional. Cognition and memory are impaired. He expresses impulsivity and inappropriate judgment. He expresses no homicidal and no suicidal ideation. He exhibits abnormal recent memory and abnormal remote memory.    Review of Systems  Constitutional: Negative.   HENT: Negative.   Eyes: Negative.   Respiratory: Negative.   Cardiovascular: Negative.   Gastrointestinal: Negative.   Musculoskeletal: Negative.   Skin: Negative.   Neurological: Negative.   Psychiatric/Behavioral: Positive for hallucinations and suicidal ideas. Negative for depression, memory loss and substance abuse. The patient is not nervous/anxious and does not have insomnia.     Blood pressure 98/68, pulse 79, temperature 98.4 F (36.9 C), temperature source Oral, resp. rate 20, height 6' (1.829 m), weight 127 kg, SpO2 99 %.Body mass index is 37.97 kg/m.  General Appearance: Disheveled  Eye Contact:  Fair  Speech:  Slow  Volume:  Decreased  Mood:  Anxious and Dysphoric  Affect:  Constricted  Thought Process:  Goal Directed  Orientation:  Full (Time, Place, and Person)  Thought Content:  Rumination and Tangential  Suicidal Thoughts:  Although the patient talks about doing things to hurt himself it seems clear that he does not actually want to die and is not feeling hopeless but is rather using the statements as a way to try to bargain for the outcome he wants  Homicidal Thoughts:  No  Memory:  Immediate;   Fair Recent;   Poor Remote;   Fair  Judgement:  Impaired  Insight:  Shallow  Psychomotor Activity:  Normal  Concentration:  Concentration:  Poor  Recall:  [place] of Knowledge:  Poor  Language:  Poor  Akathisia:  No  Handed:  Right  AIMS (if indicated):     Assets:  Financial Resources/Insurance Housing Resilience Social Support  ADL's:  Intact  Cognition:  Impaired,  Mild  Sleep:        Treatment Plan Summary: Plan Patient with chronic disability.  Diagnosis of schizophrenia.  Although he may have schizophrenia and if so his current symptoms are not really consistent with schizophrenia.  I do not believe he is currently psychotic nor do I believe that he is suffering from a major mood episode.  Patient is very clear in his conversation that the reason he makes these threats is because he wants a different disposition.  He is otherwise able to engage in very clear lucid conversation and is quite knowledgeable actually about the power that his guardian has.  Patient does not meet criteria for inpatient admission and does not meet commitment criteria.  Tried to do some supportive counseling with him.  Discontinue IVC.  No change to medicine.  Recommend he be discharged back to his group home and management in  the community.  Disposition: No evidence of imminent risk to self or others at present.   Patient does not meet criteria for psychiatric inpatient admission. Supportive therapy provided about ongoing stressors. Discussed crisis plan, support from social network, calling 911, coming to the Emergency Department, and calling Suicide Hotline.  Alethia Berthold, MD 08/07/2018 4:01 PM

## 2018-08-07 NOTE — ED Notes (Signed)
This RN called Brock BadLuwanda Ray [phone] [facility](Care Home Supervisor) and left message to call back to the ED.

## 2018-08-07 NOTE — ED Notes (Signed)
Spoke with [facility] [phone] representative and they told this writer to tell [employer] [facility].

## 2018-08-07 NOTE — Discharge Instructions (Signed)
Please continue your medicines.  See your providers this coming week.  Return for any further problems.

## 2018-08-07 NOTE — ED Notes (Signed)
This RN spoke to patient's guardian's supervisor at social services who states that patient does have a 30 day notice at his group home.  She emphasized that patient only received notice 2 days ago so patient will have to go back to group home for the time being.

## 2018-08-07 NOTE — ED Notes (Signed)
Pt. Waiting in [address] for taxi ride home.  Pt. Is alert and oriented and ready to go back to group home.

## 2018-08-07 NOTE — ED Provider Notes (Signed)
-----------------------------------------   6:12 AM on 08/07/2018 -----------------------------------------   Blood pressure 129/68, pulse 74, temperature 98 F (36.7 C), temperature source Oral, resp. rate 16, height 6' (1.829 m), weight 127 kg, SpO2 95 %.  The patient had no acute events since last update.  Calm and cooperative at this time.  Disposition is pending Psychiatry/Behavioral Medicine team recommendations.     Irean HongSung, Jade J, MD 08/07/18 [phone]

## 2018-08-07 NOTE — ED Notes (Signed)
Patient appears to be sleeping at this time.  Will continue to monitor.

## 2018-08-07 NOTE — ED Notes (Signed)
Discussed patient's blood sugar with Dr. Darnelle CatalanMalinda.  Dr. Darnelle CatalanMalinda gave instructions to give patient extra meal tray at this time.

## 2018-08-07 NOTE — ED Notes (Signed)
Pharmacy states they do not carry Pimozide.  Will order med, but it will likely not be here until this afternoon.

## 2018-08-07 NOTE — ED Notes (Signed)
Called [facility] service to pick up patient and take [facility].  [facility] familiar with Group home and Lawanda Ray's tab.  Pt. Dressed and waiting for ride.

## 2018-08-07 NOTE — ED Notes (Signed)
Pt finished full cup of water. RN gave pt a full cup of diet coke at this time.

## 2018-08-07 NOTE — BH Assessment (Signed)
Patient has been seen by Psych MD (Clapacs) and will be discharge.  TTS isn't needed at this time.

## 2018-08-07 NOTE — ED Notes (Signed)
Patient states he is feeling better.  No longer feeling weak and tired.

## 2018-08-07 NOTE — ED Notes (Signed)
Pt. Going home via taxi [employer] [facility].  Guardian and group home contacted.

## 2018-08-07 NOTE — ED Notes (Signed)
This RN called [employer] to come and get patient.

## 2018-08-07 NOTE — ED Notes (Signed)
Pt given breakfast tray

## 2018-08-07 NOTE — ED Notes (Signed)
Patient states, "Nurse, those Cowboys and Indians are gonna get me, they are after me and going to get me."  Patient is pointing and stating people are there who are not there.  Patient reoriented to reality, encouraged to relax and assured that staff would keep patient safe.  Patient remains calm and cooperative at this time.

## 2018-08-07 NOTE — Progress Notes (Signed)
LCSW engaged with ED charge and Dr Toni Amendlapacs to engage with group home provider. In review of notes the ED RN had already called and patient is returning by cab so there is no further SW needs.  Delta Air LinesClaudine Adi Doro LCSW [phone]

## 2018-08-07 NOTE — ED Notes (Signed)
Attempted to call patient's guardian to notify them of patient's pending discharge.

## 2018-08-16 ENCOUNTER — Emergency Department: Payer: Medicare Other

## 2018-08-16 ENCOUNTER — Encounter: Payer: Self-pay | Admitting: Emergency Medicine

## 2018-08-16 ENCOUNTER — Other Ambulatory Visit: Payer: Self-pay

## 2018-08-16 ENCOUNTER — Observation Stay
Admission: EM | Admit: 2018-08-16 | Discharge: 2018-08-18 | Disposition: A | Discharge status: Home / Self Care | Payer: Medicare Other | Attending: Internal Medicine | Admitting: Internal Medicine

## 2018-08-16 DIAGNOSIS — Z794 Long term (current) use of insulin: Secondary | ICD-10-CM | POA: Insufficient documentation

## 2018-08-16 DIAGNOSIS — I10 Essential (primary) hypertension: Secondary | ICD-10-CM | POA: Insufficient documentation

## 2018-08-16 DIAGNOSIS — R079 Chest pain, unspecified: Secondary | ICD-10-CM

## 2018-08-16 DIAGNOSIS — Z79899 Other long term (current) drug therapy: Secondary | ICD-10-CM | POA: Diagnosis not present

## 2018-08-16 DIAGNOSIS — E876 Hypokalemia: Secondary | ICD-10-CM | POA: Diagnosis not present

## 2018-08-16 DIAGNOSIS — R55 Syncope and collapse: Secondary | ICD-10-CM | POA: Insufficient documentation

## 2018-08-16 DIAGNOSIS — E860 Dehydration: Secondary | ICD-10-CM | POA: Insufficient documentation

## 2018-08-16 DIAGNOSIS — F79 Unspecified intellectual disabilities: Secondary | ICD-10-CM | POA: Insufficient documentation

## 2018-08-16 DIAGNOSIS — I2510 Atherosclerotic heart disease of native coronary artery without angina pectoris: Secondary | ICD-10-CM | POA: Diagnosis not present

## 2018-08-16 DIAGNOSIS — R0602 Shortness of breath: Secondary | ICD-10-CM | POA: Diagnosis not present

## 2018-08-16 DIAGNOSIS — E119 Type 2 diabetes mellitus without complications: Secondary | ICD-10-CM | POA: Insufficient documentation

## 2018-08-16 DIAGNOSIS — R0789 Other chest pain: Secondary | ICD-10-CM | POA: Insufficient documentation

## 2018-08-16 DIAGNOSIS — Z87891 Personal history of nicotine dependence: Secondary | ICD-10-CM | POA: Insufficient documentation

## 2018-08-16 DIAGNOSIS — Z7982 Long term (current) use of aspirin: Secondary | ICD-10-CM | POA: Diagnosis not present

## 2018-08-16 DIAGNOSIS — F952 Tourette's disorder: Secondary | ICD-10-CM | POA: Insufficient documentation

## 2018-08-16 DIAGNOSIS — Z7902 Long term (current) use of antithrombotics/antiplatelets: Secondary | ICD-10-CM | POA: Diagnosis not present

## 2018-08-16 DIAGNOSIS — F419 Anxiety disorder, unspecified: Principal | ICD-10-CM | POA: Insufficient documentation

## 2018-08-16 DIAGNOSIS — F200 Paranoid schizophrenia: Secondary | ICD-10-CM | POA: Diagnosis not present

## 2018-08-16 DIAGNOSIS — Z951 Presence of aortocoronary bypass graft: Secondary | ICD-10-CM | POA: Diagnosis not present

## 2018-08-16 LAB — BASIC METABOLIC PANEL
ANION GAP: 13 (ref 5–15)
BUN: 32 mg/dL — ABNORMAL HIGH (ref 8–23)
CO2: 32 mmol/L (ref 22–32)
Calcium: 9.2 mg/dL (ref 8.9–10.3)
Chloride: 90 mmol/L — ABNORMAL LOW (ref 98–111)
Creatinine, Ser: 1.4 mg/dL — ABNORMAL HIGH (ref 0.61–1.24)
GFR calc non Af Amer: 51 mL/min — ABNORMAL LOW (ref >60)
GFR, EST AFRICAN AMERICAN: 59 mL/min — AB (ref >60)
Glucose, Bld: 325 mg/dL — ABNORMAL HIGH (ref 70–99)
Potassium: 2.6 mmol/L — CL (ref 3.5–5.1)
Sodium: 135 mmol/L (ref 135–145)

## 2018-08-16 LAB — CBC
HEMATOCRIT: 33.7 % — AB (ref 40.0–52.0)
HEMATOCRIT: 34.8 % — AB (ref 40.0–52.0)
HEMOGLOBIN: 12.1 g/dL — AB (ref 13.0–18.0)
Hemoglobin: 12.3 g/dL — ABNORMAL LOW (ref 13.0–18.0)
MCH: 29.1 pg (ref 26.0–34.0)
MCH: 28.7 pg (ref 26.0–34.0)
MCHC: 35.9 g/dL (ref 32.0–36.0)
MCHC: 35.4 g/dL (ref 32.0–36.0)
MCV: 81.2 fL (ref 80.0–100.0)
MCV: 81.1 fL (ref 80.0–100.0)
PLATELETS: 229 10*3/uL (ref 150–440)
Platelets: 217 10*3/uL (ref 150–440)
RBC: 4.15 MIL/uL — AB (ref 4.40–5.90)
RBC: 4.28 MIL/uL — AB (ref 4.40–5.90)
RDW: 17.1 % — ABNORMAL HIGH (ref 11.5–14.5)
RDW: 16.8 % — AB (ref 11.5–14.5)
WBC: 7.9 10*3/uL (ref 3.8–10.6)
WBC: 8.4 10*3/uL (ref 3.8–10.6)

## 2018-08-16 LAB — CREATININE, SERUM
Creatinine, Ser: 1.47 mg/dL — ABNORMAL HIGH (ref 0.61–1.24)
GFR, EST AFRICAN AMERICAN: 56 mL/min — AB (ref >60)
GFR, EST NON AFRICAN AMERICAN: 48 mL/min — AB (ref >60)

## 2018-08-16 LAB — GLUCOSE, CAPILLARY
Glucose-Capillary: 329 mg/dL — ABNORMAL HIGH (ref 70–99)
Glucose-Capillary: 308 mg/dL — ABNORMAL HIGH (ref 70–99)
Glucose-Capillary: 319 mg/dL — ABNORMAL HIGH (ref 70–99)

## 2018-08-16 LAB — TROPONIN I
Troponin I: <0.03 ng/mL (ref <0.03)
Troponin I: <0.03 ng/mL (ref <0.03)

## 2018-08-16 LAB — MRSA PCR SCREENING: MRSA by PCR: POSITIVE — AB

## 2018-08-16 MED ORDER — ACETAMINOPHEN 650 MG RE SUPP: 650.0000 mg | Freq: Four times a day (QID) | RECTAL | Status: DC | PRN

## 2018-08-16 MED ORDER — FUROSEMIDE 40 MG PO TABS
60.0000 mg | ORAL_TABLET | Freq: Every day | ORAL | Status: DC
  Administered 2018-08-17 – 2018-08-18 (×2): 60 mg via ORAL
  Filled 2018-08-16 (×2): qty 1

## 2018-08-16 MED ORDER — DIVALPROEX SODIUM ER 250 MG PO TB24
1000.0000 mg | ORAL_TABLET | Freq: Every day | ORAL | Status: DC
  Administered 2018-08-16 – 2018-08-18 (×3): 1000 mg via ORAL
  Filled 2018-08-16 (×3): qty 4

## 2018-08-16 MED ORDER — GLIPIZIDE 5 MG PO TABS
5.0000 mg | ORAL_TABLET | Freq: Every day | ORAL | Status: DC
  Administered 2018-08-17 – 2018-08-18 (×2): 5 mg via ORAL
  Filled 2018-08-16 (×2): qty 1

## 2018-08-16 MED ORDER — ONDANSETRON HCL 4 MG PO TABS: 4.0000 mg | ORAL_TABLET | Freq: Four times a day (QID) | ORAL | Status: DC | PRN

## 2018-08-16 MED ORDER — HYDROCODONE-ACETAMINOPHEN 5-325 MG PO TABS
1.0000 | ORAL_TABLET | ORAL | Status: DC | PRN
  Administered 2018-08-16: 1 via ORAL
  Administered 2018-08-18: 2 via ORAL
  Filled 2018-08-16: qty 2
  Filled 2018-08-16: qty 1

## 2018-08-16 MED ORDER — ISOSORBIDE MONONITRATE ER 60 MG PO TB24
60.0000 mg | ORAL_TABLET | Freq: Every day | ORAL | Status: DC
  Administered 2018-08-16 – 2018-08-18 (×3): 60 mg via ORAL
  Filled 2018-08-16 (×3): qty 1

## 2018-08-16 MED ORDER — CARVEDILOL 6.25 MG PO TABS
6.2500 mg | ORAL_TABLET | Freq: Two times a day (BID) | ORAL | Status: DC
  Administered 2018-08-16 – 2018-08-18 (×3): 6.25 mg via ORAL
  Filled 2018-08-16 (×3): qty 1

## 2018-08-16 MED ORDER — AMLODIPINE BESYLATE 5 MG PO TABS
10.0000 mg | ORAL_TABLET | Freq: Every day | ORAL | Status: DC
  Administered 2018-08-16 – 2018-08-18 (×3): 10 mg via ORAL
  Filled 2018-08-16 (×3): qty 2

## 2018-08-16 MED ORDER — BENZTROPINE MESYLATE 1 MG PO TABS
0.5000 mg | ORAL_TABLET | Freq: Two times a day (BID) | ORAL | Status: DC
  Administered 2018-08-16 – 2018-08-18 (×4): 0.5 mg via ORAL
  Filled 2018-08-16 (×5): qty 1

## 2018-08-16 MED ORDER — ATORVASTATIN CALCIUM 20 MG PO TABS
80.0000 mg | ORAL_TABLET | Freq: Every day | ORAL | Status: DC
  Administered 2018-08-16 – 2018-08-17 (×2): 80 mg via ORAL
  Filled 2018-08-16 (×2): qty 4

## 2018-08-16 MED ORDER — NITROGLYCERIN 0.4 MG SL SUBL: 0.4000 mg | SUBLINGUAL_TABLET | SUBLINGUAL | Status: DC | PRN

## 2018-08-16 MED ORDER — FUROSEMIDE 40 MG PO TABS: 60.0000 mg | ORAL_TABLET | Freq: Every day | ORAL | Status: DC

## 2018-08-16 MED ORDER — ACETAMINOPHEN 325 MG PO TABS: 650.0000 mg | ORAL_TABLET | Freq: Four times a day (QID) | ORAL | Status: DC | PRN

## 2018-08-16 MED ORDER — RISPERIDONE 1 MG PO TABS
0.5000 mg | ORAL_TABLET | Freq: Two times a day (BID) | ORAL | Status: DC
  Administered 2018-08-16 – 2018-08-18 (×4): 0.5 mg via ORAL
  Filled 2018-08-16 (×5): qty 0.5

## 2018-08-16 MED ORDER — POTASSIUM CHLORIDE CRYS ER 20 MEQ PO TBCR
40.0000 meq | EXTENDED_RELEASE_TABLET | Freq: Once | ORAL | Status: AC
  Administered 2018-08-16: 40 meq via ORAL
  Filled 2018-08-16: qty 2

## 2018-08-16 MED ORDER — TAMSULOSIN HCL 0.4 MG PO CAPS
0.4000 mg | ORAL_CAPSULE | Freq: Every day | ORAL | Status: DC
  Administered 2018-08-16 – 2018-08-18 (×3): 0.4 mg via ORAL
  Filled 2018-08-16 (×3): qty 1

## 2018-08-16 MED ORDER — HYDROCODONE-ACETAMINOPHEN 5-325 MG PO TABS
ORAL_TABLET | ORAL | Status: AC
  Administered 2018-08-16: 1
  Filled 2018-08-16: qty 1

## 2018-08-16 MED ORDER — METOLAZONE 2.5 MG PO TABS
2.5000 mg | ORAL_TABLET | Freq: Every day | ORAL | Status: DC
  Administered 2018-08-16 – 2018-08-18 (×3): 2.5 mg via ORAL
  Filled 2018-08-16 (×3): qty 1

## 2018-08-16 MED ORDER — CLOPIDOGREL BISULFATE 75 MG PO TABS
75.0000 mg | ORAL_TABLET | Freq: Every day | ORAL | Status: DC
  Administered 2018-08-16 – 2018-08-18 (×3): 75 mg via ORAL
  Filled 2018-08-16 (×3): qty 1

## 2018-08-16 MED ORDER — GABAPENTIN 300 MG PO CAPS
300.0000 mg | ORAL_CAPSULE | Freq: Three times a day (TID) | ORAL | Status: DC
  Administered 2018-08-16 – 2018-08-18 (×5): 300 mg via ORAL
  Filled 2018-08-16 (×6): qty 1

## 2018-08-16 MED ORDER — BISACODYL 5 MG PO TBEC: 5.0000 mg | DELAYED_RELEASE_TABLET | Freq: Every day | ORAL | Status: DC | PRN

## 2018-08-16 MED ORDER — DOCUSATE SODIUM 100 MG PO CAPS
100.0000 mg | ORAL_CAPSULE | Freq: Two times a day (BID) | ORAL | Status: DC
  Administered 2018-08-16 – 2018-08-18 (×4): 100 mg via ORAL
  Filled 2018-08-16 (×5): qty 1

## 2018-08-16 MED ORDER — FUROSEMIDE 40 MG PO TABS
40.0000 mg | ORAL_TABLET | Freq: Every day | ORAL | Status: DC
  Administered 2018-08-16 – 2018-08-17 (×2): 40 mg via ORAL
  Filled 2018-08-16 (×2): qty 1

## 2018-08-16 MED ORDER — SODIUM CHLORIDE 0.9 % IV SOLN
INTRAVENOUS | Status: DC
  Administered 2018-08-16 – 2018-08-17 (×2): via INTRAVENOUS

## 2018-08-16 MED ORDER — ASPIRIN 325 MG PO TABS
325.0000 mg | ORAL_TABLET | Freq: Every day | ORAL | Status: DC
  Administered 2018-08-16 – 2018-08-18 (×3): 325 mg via ORAL
  Filled 2018-08-16 (×3): qty 1

## 2018-08-16 MED ORDER — MAGNESIUM HYDROXIDE 400 MG/5ML PO SUSP: 30.0000 mL | Freq: Every day | ORAL | Status: DC | PRN

## 2018-08-16 MED ORDER — TRAZODONE HCL 50 MG PO TABS: 25.0000 mg | ORAL_TABLET | Freq: Every evening | ORAL | Status: DC | PRN

## 2018-08-16 MED ORDER — POTASSIUM CHLORIDE 10 MEQ/100ML IV SOLN
10.0000 meq | Freq: Once | INTRAVENOUS | Status: AC
  Administered 2018-08-16: 10 meq via INTRAVENOUS
  Filled 2018-08-16: qty 100

## 2018-08-16 MED ORDER — ENOXAPARIN SODIUM 40 MG/0.4ML ~~LOC~~ SOLN
40.0000 mg | SUBCUTANEOUS | Status: DC
  Administered 2018-08-16 – 2018-08-17 (×2): 40 mg via SUBCUTANEOUS
  Filled 2018-08-16 (×2): qty 0.4

## 2018-08-16 MED ORDER — ONDANSETRON HCL 4 MG/2ML IJ SOLN: 4.0000 mg | Freq: Four times a day (QID) | INTRAMUSCULAR | Status: DC | PRN

## 2018-08-16 MED ORDER — PIMOZIDE 2 MG PO TABS
1.0000 mg | ORAL_TABLET | Freq: Two times a day (BID) | ORAL | Status: DC
  Administered 2018-08-16 – 2018-08-18 (×4): 1 mg via ORAL
  Filled 2018-08-16 (×5): qty 1

## 2018-08-16 MED ORDER — INSULIN ASPART 100 UNIT/ML ~~LOC~~ SOLN
0.0000 [IU] | Freq: Three times a day (TID) | SUBCUTANEOUS | Status: DC
  Administered 2018-08-16: 7 [IU] via SUBCUTANEOUS
  Administered 2018-08-17: 5 [IU] via SUBCUTANEOUS
  Administered 2018-08-17 – 2018-08-18 (×2): 9 [IU] via SUBCUTANEOUS
  Administered 2018-08-18: 5 [IU] via SUBCUTANEOUS
  Filled 2018-08-16 (×4): qty 1

## 2018-08-16 MED ORDER — INSULIN GLARGINE 100 UNIT/ML ~~LOC~~ SOLN
28.0000 [IU] | Freq: Every day | SUBCUTANEOUS | Status: DC
  Administered 2018-08-16 – 2018-08-18 (×3): 28 [IU] via SUBCUTANEOUS
  Filled 2018-08-16 (×4): qty 0.28

## 2018-08-16 NOTE — ED Notes (Signed)
Pt c/o of mid CP that radiates to upper back and between shoulder blades. Pt denies N/V/D and SOB. Pt states "I feel dizzy when I walk". VSS within normal limits. NAD noted at this time.

## 2018-08-16 NOTE — Clinical Social Work Note (Signed)
Clinical Social Work Assessment  Patient Details  Name: Jason [name] MRN: [id number] Date of Birth: 05-31-1951  Date of referral:  08/16/18               Reason for consult:  Other (Comment Required)(Medical issues ( chest pain))                Permission sought to share information with:  Facility Medical sales representative, Guardian Permission granted to share information::  Yes, Verbal Permission Granted  Name::     Buford Dresser [phone]  Agency::     Relationship::     Contact Information:     Housing/Transportation Living arrangements for the past 2 months:  Group Home Source of Information:  Patient, Medical Team Patient Interpreter Needed:  None Criminal Activity/Legal Involvement Pertinent to Current Situation/Hospitalization:  No - Comment as needed Significant Relationships:  Mental Health Provider, Community Support Lives with:  Facility Resident Do you feel safe going back to the place where you live?  Yes Need for family participation in patient care:  No (Coment)  Care giving concerns:  TBD   Social Worker assessment / plan:  LCSW introduced myself to patient he is alert and oriented x4 Jason [name] is 67 year old schizophrenic who came to ED because of chest pain and  with a known history of hypertension, CAD status post CABG in 2012, diabetes mellitus   Started to have chest pain since last night .  Complains of chest pain in the middle of the chest without associated shortness of breath, sweating, cough.  Chest pain relieved with aspirin given by EMS.  Has history of quadruple bypass in 2012.  Initial troponins have been negative but patient found to have potassium of 2.6. Patient is ambulatory, but requires prompting and some supervision with his ADLs. Called [facility] and left message "patient being admitted. Wonda Amis Ray was called and left a message, the other number is not in service.  Employment status:  Disabled (Comment on whether or not  currently receiving Disability) Insurance information:  Medicare, Medicaid In [place] PT Recommendations:    Information / Referral to community resources:   na  Patient/Family's Response to care:  TBD  Patient/Family's Understanding of and Emotional Response to Diagnosis, Current Treatment, and Prognosis:    Emotional Assessment Appearance:  Appears stated age Attitude/Demeanor/Rapport:  Engaged Affect (typically observed):  Accepting, Calm Orientation:  Oriented to Self, Oriented to Place, Oriented to Situation, Oriented to  Time Alcohol / Substance use:  Not Applicable Psych involvement (Current and /or in the community):  Yes (Comment)  Discharge Needs  Concerns to be addressed:  Care Coordination Readmission within the last 30 days:  No Current discharge risk:  None Barriers to Discharge:  Continued Medical Work up   Cheron Schaumann, LCSW 08/16/2018, 2:48 PM

## 2018-08-16 NOTE — ED Notes (Signed)
Patient given warm blanket and diet coke

## 2018-08-16 NOTE — H&P (Signed)
[facility] [place] [facility]   PATIENT NAME: Jason [name]    MR#:  [id number]  DATE OF BIRTH:  [birth year]  DATE OF ADMISSION:  08/16/2018  PRIMARY CARE PHYSICIAN: Patient, No Pcp Per   REQUESTING/REFERRING PHYSICIAN: Dr. Shaune Pollack  CHIEF COMPLAINT: Chest pain, near syncope   Chief Complaint  Patient presents with  . Chest Pain    HISTORY OF PRESENT ILLNESS:  Jason [name]  is a 67 y.o. male with a known history of hypertension, CAD status post CABG in 2012, diabetes mellitus comes from group home because of chest pain started tonight.  Patient stated that he was in argument with his roommate and he is unhappy road his group home.  Started to have chest pain since last night .  Complains of chest pain in the middle of the chest without associated shortness of breath, sweating, cough.  Chest pain relieved with aspirin given by EMS.  Has history of quadruple bypass in 2012.  Initial troponins have been negative but patient found to have potassium of 2.6. Patient reported that this morning he had near syncopal episode where he felt dizzy, nearly passed out.  PAST MEDICAL HISTORY:   Past Medical History:  Diagnosis Date  . Diabetes mellitus without complication (HCC)   . GERD (gastroesophageal reflux disease)   . Hypertension   . Paranoid schizophrenia (HCC)   . Tourette disorder     PAST SURGICAL HISTOIRY:   Past Surgical History:  Procedure Laterality Date  . CARDIAC SURGERY    . HAND SURGERY    . TONSILLECTOMY      SOCIAL HISTORY:   Social History   Tobacco Use  . Smoking status: Former Games developer  . Smokeless tobacco: Never Used  Substance Use Topics  . Alcohol use: Not Currently    FAMILY HISTORY:  History reviewed. No pertinent family history.  DRUG ALLERGIES:   Allergies  Allergen Reactions  . Keflex [Cephalexin] Rash    REVIEW OF SYSTEMS:  CONSTITUTIONAL: No fever, fatigue or weakness.  EYES: No blurred or double vision.   EARS, NOSE, AND THROAT: No tinnitus or ear pain.  Hard of hearing RESPIRATORY: No cough, shortness of breath, wheezing or hemoptysis.  CARDIOVASCULAR: Chest pain, near-syncope gASTROINTESTINAL: No nausea, vomiting, diarrhea or abdominal pain.  GENITOURINARY: No dysuria, hematuria.  ENDOCRINE: No polyuria, nocturia,  HEMATOLOGY: No anemia, easy bruising or bleeding SKIN: No rash or lesion. MUSCULOSKELETAL: No joint pain or arthritis.   NEUROLOGIC: No tingling, numbness, weakness.  PSYCHIATRY: No anxiety or depression.   MEDICATIONS AT HOME:   Prior to Admission medications   Medication Sig Start Date End Date Taking? Authorizing Provider  acetaminophen (TYLENOL) 500 MG tablet Take 500 mg by mouth every 4 (four) hours as needed.    [provider]  amLODipine (NORVASC) 10 MG tablet Take 10 mg by mouth daily.    [provider]  aspirin 325 MG tablet Take 325 mg by mouth daily.    [provider]  atorvastatin (LIPITOR) 80 MG tablet Take 80 mg by mouth daily.    [provider]  benztropine (COGENTIN) 0.5 MG tablet Take 0.5 mg by mouth 2 (two) times daily.    [provider]  carvedilol (COREG) 6.25 MG tablet Take 6.25 mg by mouth 2 (two) times daily with a meal.    [provider]  clopidogrel (PLAVIX) 75 MG tablet Take 75 mg by mouth daily.    [provider]  DIALYVITE VITAMIN D3  MAX 16109 units TABS Take 50,000 Units by mouth every 7 (seven) days. 07/24/18   [provider]  divalproex (DEPAKOTE ER) 500 MG 24 hr tablet Take 1,000 mg by mouth daily.    [provider]  furosemide (LASIX) 40 MG tablet Take 60 mg by mouth daily.    [provider]  furosemide (LASIX) 40 MG tablet Take 40 mg by mouth daily.    [provider]  gabapentin (NEURONTIN) 300 MG capsule Take 300 mg by mouth 3 (three) times daily.    [provider]  glipiZIDE (GLUCOTROL) 5 MG tablet Take 5 mg by mouth daily  before breakfast.    [provider]  guaiFENesin (ROBITUSSIN) 100 MG/5ML SOLN Take 5 mLs by mouth every 4 (four) hours as needed for cough or to loosen phlegm.    [provider]  insulin glargine (LANTUS) 100 UNIT/ML injection Inject 100 Units into the skin at bedtime.    [provider]  insulin glargine (LANTUS) 100 UNIT/ML injection Inject 28 Units into the skin daily.    [provider]  insulin lispro (HUMALOG) 100 UNIT/ML injection Inject 57 Units into the skin 3 (three) times daily before meals.    [provider]  isosorbide mononitrate (IMDUR) 60 MG 24 hr tablet Take 60 mg by mouth daily.    [provider]  loperamide (IMODIUM) 2 MG capsule Take 2 mg by mouth as needed for diarrhea or loose stools.    [provider]  magnesium hydroxide (MILK OF MAGNESIA) 400 MG/5ML suspension Take 30 mLs by mouth daily as needed for mild constipation.    [provider]  metFORMIN (GLUCOPHAGE) 1000 MG tablet Take 1,000 mg by mouth 2 (two) times daily with a meal.    [provider]  metolazone (ZAROXOLYN) 2.5 MG tablet Take 2.5 mg by mouth daily.    [provider]  neomycin-bacitracin-polymyxin (NEOSPORIN) [phone] ointment Apply 1 application topically as needed.    [provider]  nitroGLYCERIN (NITROSTAT) 0.4 MG SL tablet Place 0.4 mg under the tongue every 5 (five) minutes as needed for chest pain.    [provider]  Pimozide 1 MG TABS Take 1 mg by mouth 2 (two) times daily.    [provider]  Potassium Chloride CR (MICRO-K) 8 MEQ CPCR capsule CR Take 8 mEq by mouth 2 (two) times daily.    [provider]  risperiDONE (RISPERDAL) 1 MG tablet Take 0.5 mg by mouth 2 (two) times daily.    [provider]  tamsulosin (FLOMAX) 0.4 MG CAPS capsule Take 0.4 mg by mouth daily after breakfast.    [provider]  Vitamin D, Ergocalciferol, (DRISDOL) 50000 units  CAPS capsule Take 50,000 Units by mouth every 7 (seven) days.    [provider]      VITAL SIGNS:  Blood pressure 110/65, pulse 66, temperature 98.4 F (36.9 C), temperature source Oral, resp. rate 20, height 6' (1.829 m), weight 128.8 kg, SpO2 99 %.  PHYSICAL EXAMINATION:  GENERAL:  67 y.o.-year-old patient lying in the bed with no acute distress.  EYES: Pupils equal, round, reactive to light and accommodation. No scleral icterus. Extraocular muscles intact.  HEENT: Head atraumatic, normocephalic. Oropharynx and nasopharynx clear.  NECK:  Supple, no jugular venous distention. No thyroid enlargement, no tenderness.  LUNGS: Normal breath sounds bilaterally, no wheezing, rales,rhonchi or crepitation. No use of accessory muscles of respiration.  CARDIOVASCULAR: S1, S2 normal. No murmurs, rubs, or gallops.  Has midline chest wall scar status post CABG. ABDOMEN: Soft, nontender, nondistended. Bowel sounds present. No organomegaly or mass.  EXTREMITIES: No pedal edema, cyanosis, or clubbing.  NEUROLOGIC: Cranial nerves II through XII are intact. Muscle strength 5/5 in all extremities. Sensation intact. Gait not checked.  PSYCHIATRIC: The patient is alert and oriented x 3.  SKIN: No obvious rash, lesion, or ulcer.   LABORATORY PANEL:   CBC Recent Labs  Lab 08/16/18 1004  WBC 7.9  HGB 12.1*  HCT 33.7*  PLT 217   ------------------------------------------------------------------------------------------------------------------  Chemistries  Recent Labs  Lab 08/16/18 1004  NA 135  K 2.6*  CL 90*  CO2 32  GLUCOSE 325*  BUN 32*  CREATININE 1.40*  CALCIUM 9.2   ------------------------------------------------------------------------------------------------------------------  Cardiac Enzymes Recent Labs  Lab 08/16/18 1004  TROPONINI <0.03   ------------------------------------------------------------------------------------------------------------------  RADIOLOGY:   Dg Chest 2 View  Result Date: 08/16/2018 CLINICAL DATA:  Dizziness, weakness, chest pain EXAM: CHEST - 2 VIEW COMPARISON:  None. FINDINGS: There is mild bilateral interstitial thickening. There is no focal parenchymal opacity. There is no pleural effusion or pneumothorax. There is stable cardiomegaly. There is evidence of prior CABG. The osseous structures are unremarkable. IMPRESSION: Cardiomegaly with mild pulmonary vascular congestion. Electronically Signed   By: Elige Ko   On: 08/16/2018 10:54    EKG:   Orders placed or performed during the hospital encounter of 08/16/18  . ED EKG within 10 minutes  . ED EKG within 10 minutes  . EKG 12-Lead  . EKG 12-Lead    IMPRESSION AND PLAN:   67 year old male with history of CAD, CABG, diabetes mellitus type 2, paranoid schizophrenia,, Tourette's syndrome comes in because of midsternal chest pain, near syncope.  Patient was here recently seen in the ER, Dr. Toni Amend discharged to group home, patient had argument with roommate last night and having chest pain since then.  Assessment and plan 1.  Midsternal chest pain likely secondary to anxiety but because of history of CAD, CABG admitting to telemetry cycle troponins, continue aspirin, beta-blockers, statins, nitrates.,  Cycle troponins, obtain Lexiscan stress test tomorrow. 2.  Near syncope likely secondary to hypokalemia, dehydration, monitor on telemetry to evaluate for cardiac arrhythmias, check orthostatic vitals, patient was hypotensive when he came received fluid now blood pressure is better. #3 diabetes mellitus type 2: Resume glipizide, Lantus. #4 history of schizo affective disorder, Tourette's syndrome, seen by Dr. Toni Amend recently, patient is on benztropine, Depakote, Risperdal: Continue them. 5.  Hypokalemia likely secondary to diuretics including Lasix, Zaroxolyn: Patient received IV and p.o. potassium supplements in the emergency room.    All the records are reviewed and case  discussed with ED provider. Management plans discussed with the patient, family and they are in agreement.  CODE STATUS:full  TOTAL TIME TAKING CARE OF THIS PATIENT: .    Katha Hamming M.D on 08/16/2018 at 2:31 PM  Between 7am to 6pm - Pager - [phone]  After 6pm go to [email or web address] - password [email or web address]  [facility]  Office  [phone]  CC: Primary care physician; Patient, No Pcp Per  Note: This dictation was prepared with Dragon dictation along with smaller phrase technology. Any transcriptional errors that result from this process are unintentional.

## 2018-08-16 NOTE — ED Provider Notes (Signed)
[facility] Emergency Department Provider Note ____________________________________________   I have reviewed the triage vital signs and the triage nursing note.  HISTORY  Chief Complaint Chest Pain   Historian Patient  HPI Jason [name] is a 67 y.o. male presents from group home, history of hypertension diabetes, questionable cardiac disease although patient is a bit of a poor historian, as well as schizophrenia, presenting for chest pain that started overnight.  He states that he was in argument with his roommate and he is unhappy to his group home and started to have chest discomfort overnight.  It seemed a little worse this morning and so he came in for evaluation.  Central chest discomfort and that he feels it through to his back.  Took 4 baby aspirin with EMS and he states he is feeling much better now.  He reports this morning he had a near syncopal episode where he felt dizzy and nearly passed out.  He has chronic shortness of breath, no new shortness of breath.  No cough or production of sputum.  No fevers.  No indigestion or abdominal pain or vomiting or diarrhea.  States that he has had several episodes of feeling like he might pass out including on Thursday when he saw his regular doctor per the patient.  Is he had some vomiting and diarrhea on Friday, but that was better yesterday and today.     Past Medical History:  Diagnosis Date  . Diabetes mellitus without complication (HCC)   . GERD (gastroesophageal reflux disease)   . Hypertension   . Paranoid schizophrenia (HCC)   . Tourette disorder     Patient Active Problem List   Diagnosis Date Noted  . Hypertension 08/07/2018  . Malingering 08/07/2018  . Intellectual disability 08/07/2018  . Paranoid schizophrenia (HCC) 07/23/2018  . Diabetes mellitus without complication (HCC) 07/23/2018  . Tourette disorder 07/23/2018    Past Surgical History:  Procedure Laterality Date  . CARDIAC  SURGERY    . HAND SURGERY    . TONSILLECTOMY      Prior to Admission medications   Medication Sig Start Date End Date Taking? Authorizing Provider  acetaminophen (TYLENOL) 500 MG tablet Take 500 mg by mouth every 4 (four) hours as needed.    [provider]  amLODipine (NORVASC) 10 MG tablet Take 10 mg by mouth daily.    [provider]  aspirin 325 MG tablet Take 325 mg by mouth daily.    [provider]  atorvastatin (LIPITOR) 80 MG tablet Take 80 mg by mouth daily.    [provider]  benztropine (COGENTIN) 0.5 MG tablet Take 0.5 mg by mouth 2 (two) times daily.    [provider]  carvedilol (COREG) 6.25 MG tablet Take 6.25 mg by mouth 2 (two) times daily with a meal.    [provider]  clopidogrel (PLAVIX) 75 MG tablet Take 75 mg by mouth daily.    [provider]  DIALYVITE VITAMIN D3 MAX 16109 units TABS Take 50,000 Units by mouth every 7 (seven) days. 07/24/18   [provider]  divalproex (DEPAKOTE ER) 500 MG 24 hr tablet Take 1,000 mg by mouth daily.    [provider]  furosemide (LASIX) 40 MG tablet Take 60 mg by mouth daily.    [provider]  furosemide (LASIX) 40 MG tablet Take 40 mg by mouth daily.    [provider]  gabapentin (NEURONTIN) 300 MG capsule Take 300 mg by mouth  3 (three) times daily.    [provider]  glipiZIDE (GLUCOTROL) 5 MG tablet Take 5 mg by mouth daily before breakfast.    [provider]  guaiFENesin (ROBITUSSIN) 100 MG/5ML SOLN Take 5 mLs by mouth every 4 (four) hours as needed for cough or to loosen phlegm.    [provider]  insulin glargine (LANTUS) 100 UNIT/ML injection Inject 100 Units into the skin at bedtime.    [provider]  insulin glargine (LANTUS) 100 UNIT/ML injection Inject 28 Units into the skin daily.    [provider]  insulin lispro (HUMALOG) 100 UNIT/ML injection Inject 57 Units into the  skin 3 (three) times daily before meals.    [provider]  isosorbide mononitrate (IMDUR) 60 MG 24 hr tablet Take 60 mg by mouth daily.    [provider]  loperamide (IMODIUM) 2 MG capsule Take 2 mg by mouth as needed for diarrhea or loose stools.    [provider]  magnesium hydroxide (MILK OF MAGNESIA) 400 MG/5ML suspension Take 30 mLs by mouth daily as needed for mild constipation.    [provider]  metFORMIN (GLUCOPHAGE) 1000 MG tablet Take 1,000 mg by mouth 2 (two) times daily with a meal.    [provider]  metolazone (ZAROXOLYN) 2.5 MG tablet Take 2.5 mg by mouth daily.    [provider]  neomycin-bacitracin-polymyxin (NEOSPORIN) [phone] ointment Apply 1 application topically as needed.    [provider]  nitroGLYCERIN (NITROSTAT) 0.4 MG SL tablet Place 0.4 mg under the tongue every 5 (five) minutes as needed for chest pain.    [provider]  Pimozide 1 MG TABS Take 1 mg by mouth 2 (two) times daily.    [provider]  Potassium Chloride CR (MICRO-K) 8 MEQ CPCR capsule CR Take 8 mEq by mouth 2 (two) times daily.    [provider]  risperiDONE (RISPERDAL) 1 MG tablet Take 0.5 mg by mouth 2 (two) times daily.    [provider]  tamsulosin (FLOMAX) 0.4 MG CAPS capsule Take 0.4 mg by mouth daily after breakfast.    [provider]  Vitamin D, Ergocalciferol, (DRISDOL) 50000 units CAPS capsule Take 50,000 Units by mouth every 7 (seven) days.    [provider]    Allergies  Allergen Reactions  . Keflex [Cephalexin] Rash    History reviewed. No pertinent family history.  Social History Social History   Tobacco Use  . Smoking status: Former Games developer  . Smokeless tobacco: Never Used  Substance Use Topics  . Alcohol use: Not Currently  . Drug use: Not Currently    Review of Systems  Constitutional: Negative for fever. Eyes: Negative for visual  changes. ENT: Negative for sore throat. Cardiovascular: Positive as per HPI for chest pain. Respiratory: Positive for chronic shortness of breath. Gastrointestinal: Negative for abdominal pain. Genitourinary: Negative for dysuria. Musculoskeletal: Positive for chronic back pain. Skin: Negative for rash. Neurological: Negative for headache.  ____________________________________________   PHYSICAL EXAM:  VITAL SIGNS: ED Triage Vitals  Enc Vitals Group     BP 08/16/18 1006 120/70     Pulse Rate 08/16/18 1006 71     Resp 08/16/18 1006 (!) 23     Temp 08/16/18 1006 98.4 F (36.9 C)     Temp Source 08/16/18 1006 Oral     SpO2 08/16/18 1005 100 %     Weight 08/16/18 1008 284 lb (128.8 kg)     Height  08/16/18 1008 6' (1.829 m)     Head Circumference --      Peak Flow --      Pain Score 08/16/18 1007 8     Pain Loc --      Pain Edu? --      Excl. in GC? --      Constitutional: Alert and cooperative, seems fairly anxious.  HEENT      Head: Normocephalic and atraumatic.      Eyes: Conjunctivae are normal. Pupils equal and round.       Ears:         Nose: No congestion/rhinnorhea.      Mouth/Throat: Mucous membranes are moist.      Neck: No stridor. Cardiovascular/Chest: Normal rate, regular rhythm.  No murmurs, rubs, or gallops.  Nontender chest wall to palpation. Respiratory: Normal respiratory effort without tachypnea nor retractions. Breath sounds are clear and equal bilaterally.  Decreased air movement, mild rhonchi without wheezing or rales. Gastrointestinal: Soft. No distention, no guarding, no rebound. Nontender.  Obese. Genitourinary/rectal:Deferred Musculoskeletal: Nontender with normal range of motion in all extremities. No joint effusions.  No lower extremity tenderness.  Trace lower extremity edema bilateral. Neurologic:  Normal speech and language. No gross or focal neurologic deficits are appreciated. Skin:  Skin is warm, dry and intact. No rash  noted. Psychiatric: Calm and cooperative.  He is fairly anxious.  He is still unhappy with his group home placement but states that his guardian is working on possible change.   ____________________________________________  LABS (pertinent positives/negatives) I, Governor Rooks, MD the attending physician have reviewed the labs noted below.  Labs Reviewed  BASIC METABOLIC PANEL - Abnormal; Notable for the following components:      Result Value   Potassium 2.6 (*)    Chloride 90 (*)    Glucose, Bld 325 (*)    BUN 32 (*)    Creatinine, Ser 1.40 (*)    GFR calc non Af Amer 51 (*)    GFR calc Af Amer 59 (*)    All other components within normal limits  CBC - Abnormal; Notable for the following components:   RBC 4.15 (*)    Hemoglobin 12.1 (*)    HCT 33.7 (*)    RDW 17.1 (*)    All other components within normal limits  GLUCOSE, CAPILLARY - Abnormal; Notable for the following components:   Glucose-Capillary 329 (*)    All other components within normal limits  TROPONIN I    ____________________________________________    EKG I, Governor Rooks, MD, the attending physician have personally viewed and interpreted all ECGs.  70 bpm undetermined rhythm but I suspect sinus rhythm with first-degree AV block.  Right bundle branch block.  Nonspecific ST/T wave  Somewhat similar to prior EKG with the first degree AV block and right bundle branch block. ____________________________________________  RADIOLOGY   Chest x-ray two-view, viewed by myself and reviewed radiologist and rotation:  IMPRESSION: Cardiomegaly with mild pulmonary vascular congestion. __________________________________________  PROCEDURES  Procedure(s) performed: None  Procedures  Critical Care performed: None   ____________________________________________  ED COURSE / ASSESSMENT AND PLAN  Pertinent labs & imaging results that were available during my care of the patient were reviewed by me and considered  in my medical decision making (see chart for details).    Patient states chest pain overnight, without really associated symptoms, much better this morning after EMS transport here and taking aspirin.  He seems still quite anxious.  I saw  him a few days ago when he was unhappy with his group home, he still unhappy there and had an argument overnight with a roommate which may have precipitated some stress related discomfort.  I will have to take deeper to find out if he has coronary artery disease, but by his medication list it appears that he is on aspirin 325 as well as Plavix, so I am suspicious of underlying risk factor for coronary artery disease.  A chart review from family medicine and vascular surgery looks like patient has history of essential hypertension as well as diabetes, as well as vascular insufficiency, chf, and no documented CAD that I can find.   Patient found to have significant hypokalemia with generalized weakness, will start with p.o. and IV repletion, plan for hospital admission due to near syncope, chest pain, and hypokalemia    CONSULTATIONS: Hospitalist for admission   Patient / Family / Caregiver informed of clinical course, medical decision-making process, and agree with plan.    ___________________________________________   FINAL CLINICAL IMPRESSION(S) / ED DIAGNOSES   Final diagnoses:  Hypokalemia  Near syncope  Nonspecific chest pain      ___________________________________________         Note: This dictation was prepared with Dragon dictation. Any transcriptional errors that result from this process are unintentional    Governor Rooks, MD 08/16/18 1402

## 2018-08-16 NOTE — ED Notes (Signed)
Date and time results received: 08/16/18  (use smartphrase ".now" to insert current time)  Test: potassium Critical Value: 2.6  Name of Provider Notified:Lord, MD   Orders Received? Or Actions Taken?:

## 2018-08-16 NOTE — ED Notes (Signed)
Admitting MD at bedside.

## 2018-08-16 NOTE — ED Notes (Signed)
Attempted report. Staff on floor reported RN is in another room and will call back

## 2018-08-16 NOTE — ED Notes (Signed)
Patient transported to X-ray 

## 2018-08-16 NOTE — ED Notes (Signed)
Patient given breakfast tray and diet coke

## 2018-08-16 NOTE — ED Triage Notes (Signed)
Pt arrived via [facility]. Per EMS pt reported CP started last night and progressively got worse. 325mg  Aspirin and Nitroglycerin patch at 0930 was given by [facility]. VSS. NAD noted

## 2018-08-16 NOTE — Plan of Care (Signed)

## 2018-08-16 NOTE — [place] FL2 (Signed)
[place] MEDICAID FL2 LEVEL OF CARE SCREENING TOOL     IDENTIFICATION  Patient Name: Jason [name] Birthdate: 11-09-1951 Sex: male Admission Date (Current Location): 08/16/2018  [place] and [place] Number:  Chiropodist and Address:  [facility], [address], [place], [place] [address]      Provider Number: [id number]  Attending Physician Name and Address:  Katha Hamming, MD  Relative Name and Phone Number:     Cherre Huger [phone]  Current Level of Care: Hospital Recommended Level of Care: Family Care Home Prior Approval Number:    Date Approved/Denied:   PASRR Number:    Discharge Plan: Domiciliary (Rest home)([facility])    Current Diagnoses: Patient Active Problem List   Diagnosis Date Noted  . Hypokalemia due to inadequate potassium intake 08/16/2018  . Hypertension 08/07/2018  . Malingering 08/07/2018  . Intellectual disability 08/07/2018  . Paranoid schizophrenia (HCC) 07/23/2018  . Diabetes mellitus without complication (HCC) 07/23/2018  . Tourette disorder 07/23/2018    Orientation RESPIRATION BLADDER Height & Weight     Self, Situation, Place, Time  Normal Continent Weight: 284 lb (128.8 kg) Height:  6' (182.9 cm)  BEHAVIORAL SYMPTOMS/MOOD NEUROLOGICAL BOWEL NUTRITION STATUS      Continent Diet(Diabetic)  AMBULATORY STATUS COMMUNICATION OF NEEDS Skin   Limited Assist Verbally Normal                       Personal Care Assistance Level of Assistance  Bathing, Feeding, Dressing, Total care Bathing Assistance: Limited assistance Feeding assistance: Independent Dressing Assistance: Limited assistance Total Care Assistance: Limited assistance   Functional Limitations Info  Sight, Hearing, Speech Sight Info: Adequate Hearing Info: Impaired(Very Hard of hearing) Speech Info: Adequate    SPECIAL CARE FACTORS FREQUENCY                       Contractures Contractures Info: Not present     Additional Factors Info  Insulin Sliding Scale, Allergies, Psychotropic   Allergies Info: Keflex Cephalaxin Psychotropic Info: See MAR Insulin Sliding Scale Info: See MAR       Current Medications (08/16/2018):  This is the current hospital active medication list Current Facility-Administered Medications  Medication Dose Route Frequency Provider Last Rate Last Dose  . 0.9 %  sodium chloride infusion   Intravenous Continuous Katha Hamming, MD      . acetaminophen (TYLENOL) tablet 650 mg  650 mg Oral Q6H PRN Katha Hamming, MD       Or  . acetaminophen (TYLENOL) suppository 650 mg  650 mg Rectal Q6H PRN Katha Hamming, MD      . amLODipine (NORVASC) tablet 10 mg  10 mg Oral Daily Katha Hamming, MD      . aspirin tablet 325 mg  325 mg Oral Daily Katha Hamming, MD      . atorvastatin (LIPITOR) tablet 80 mg  80 mg Oral Daily Katha Hamming, MD      . benztropine (COGENTIN) tablet 0.5 mg  0.5 mg Oral BID Katha Hamming, MD      . bisacodyl (DULCOLAX) EC tablet 5 mg  5 mg Oral Daily PRN Katha Hamming, MD      . carvedilol (COREG) tablet 6.25 mg  6.25 mg Oral BID WC Katha Hamming, MD      . clopidogrel (PLAVIX) tablet 75 mg  75 mg Oral Daily Katha Hamming, MD      . divalproex (DEPAKOTE ER) 24 hr tablet  1,000 mg  1,000 mg Oral Daily Katha Hamming, MD      . docusate sodium (COLACE) capsule 100 mg  100 mg Oral BID Katha Hamming, MD      . enoxaparin (LOVENOX) injection 40 mg  40 mg Subcutaneous Q24H Katha Hamming, MD      . furosemide (LASIX) tablet 40 mg  40 mg Oral Daily Katha Hamming, MD      . furosemide (LASIX) tablet 60 mg  60 mg Oral Daily Katha Hamming, MD      . gabapentin (NEURONTIN) capsule 300 mg  300 mg Oral TID Katha Hamming, MD      . Melene Muller ON 08/17/2018] glipiZIDE (GLUCOTROL) tablet 5 mg  5 mg Oral QAC breakfast Katha Hamming, MD      . HYDROcodone-acetaminophen  (NORCO/VICODIN) 5-325 MG per tablet 1-2 tablet  1-2 tablet Oral Q4H PRN Katha Hamming, MD      . insulin aspart (novoLOG) injection 0-9 Units  0-9 Units Subcutaneous TID WC Katha Hamming, MD      . insulin glargine (LANTUS) injection 28 Units  28 Units Subcutaneous Daily Katha Hamming, MD      . isosorbide mononitrate (IMDUR) 24 hr tablet 60 mg  60 mg Oral Daily Katha Hamming, MD      . magnesium hydroxide (MILK OF MAGNESIA) suspension 30 mL  30 mL Oral Daily PRN Katha Hamming, MD      . metolazone (ZAROXOLYN) tablet 2.5 mg  2.5 mg Oral Daily Katha Hamming, MD      . nitroGLYCERIN (NITROSTAT) SL tablet 0.4 mg  0.4 mg Sublingual Q5 min PRN Katha Hamming, MD      . ondansetron (ZOFRAN) tablet 4 mg  4 mg Oral Q6H PRN Katha Hamming, MD       Or  . ondansetron (ZOFRAN) injection 4 mg  4 mg Intravenous Q6H PRN Katha Hamming, MD      . Pimozide TABS 1 mg  1 mg Oral BID Katha Hamming, MD      . potassium chloride 10 mEq in 100 mL IVPB  10 mEq Intravenous Once Governor Rooks, MD 100 mL/hr at 08/16/18 1415 10 mEq at 08/16/18 1415  . risperiDONE (RISPERDAL) tablet 0.5 mg  0.5 mg Oral BID Katha Hamming, MD      . Melene Muller ON 08/17/2018] tamsulosin (FLOMAX) capsule 0.4 mg  0.4 mg Oral QPC breakfast Katha Hamming, MD      . traZODone (DESYREL) tablet 25 mg  25 mg Oral QHS PRN Katha Hamming, MD       Current Outpatient Medications  Medication Sig Dispense Refill  . magnesium hydroxide (MILK OF MAGNESIA) 400 MG/5ML suspension Take 30 mLs by mouth daily as needed for mild constipation.    . metFORMIN (GLUCOPHAGE) 1000 MG tablet Take 1,000 mg by mouth 2 (two) times daily with a meal.    . metolazone (ZAROXOLYN) 2.5 MG tablet Take 2.5 mg by mouth daily.    . nitroGLYCERIN (NITROSTAT) 0.4 MG SL tablet Place 0.4 mg under the tongue every 5 (five) minutes as needed for chest pain.    Marland Kitchen acetaminophen (TYLENOL) 500 MG tablet Take  500 mg by mouth every 4 (four) hours as needed.    Marland Kitchen amLODipine (NORVASC) 10 MG tablet Take 10 mg by mouth daily.    Marland Kitchen aspirin 325 MG tablet Take 325 mg by mouth daily.    Marland Kitchen atorvastatin (LIPITOR) 80 MG tablet Take 80 mg by mouth daily.    . benztropine (COGENTIN) 0.5 MG  tablet Take 0.5 mg by mouth 2 (two) times daily.    . carvedilol (COREG) 6.25 MG tablet Take 6.25 mg by mouth 2 (two) times daily with a meal.    . clopidogrel (PLAVIX) 75 MG tablet Take 75 mg by mouth daily.    Marland Kitchen DIALYVITE VITAMIN D3 MAX 16109 units TABS Take 50,000 Units by mouth every 7 (seven) days.  0  . divalproex (DEPAKOTE ER) 500 MG 24 hr tablet Take 1,000 mg by mouth daily.    . furosemide (LASIX) 40 MG tablet Take 60 mg by mouth daily.    . furosemide (LASIX) 40 MG tablet Take 40 mg by mouth daily.    Marland Kitchen gabapentin (NEURONTIN) 300 MG capsule Take 300 mg by mouth 3 (three) times daily.    Marland Kitchen glipiZIDE (GLUCOTROL) 5 MG tablet Take 5 mg by mouth daily before breakfast.    . guaiFENesin (ROBITUSSIN) 100 MG/5ML SOLN Take 5 mLs by mouth every 4 (four) hours as needed for cough or to loosen phlegm.    . insulin glargine (LANTUS) 100 UNIT/ML injection Inject 100 Units into the skin at bedtime.    . insulin glargine (LANTUS) 100 UNIT/ML injection Inject 28 Units into the skin daily.    . insulin lispro (HUMALOG) 100 UNIT/ML injection Inject 57 Units into the skin 3 (three) times daily before meals.    . isosorbide mononitrate (IMDUR) 60 MG 24 hr tablet Take 60 mg by mouth daily.    Marland Kitchen loperamide (IMODIUM) 2 MG capsule Take 2 mg by mouth as needed for diarrhea or loose stools.    Marland Kitchen neomycin-bacitracin-polymyxin (NEOSPORIN) [phone] ointment Apply 1 application topically as needed.    . Pimozide 1 MG TABS Take 1 mg by mouth 2 (two) times daily.    . Potassium Chloride CR (MICRO-K) 8 MEQ CPCR capsule CR Take 8 mEq by mouth 2 (two) times daily.    . risperiDONE (RISPERDAL) 1 MG tablet Take 0.5 mg by mouth 2 (two) times daily.    .  tamsulosin (FLOMAX) 0.4 MG CAPS capsule Take 0.4 mg by mouth daily after breakfast.    . Vitamin D, Ergocalciferol, (DRISDOL) 50000 units CAPS capsule Take 50,000 Units by mouth every 7 (seven) days.       Discharge Medications: Please see discharge summary for a list of discharge medications.  Relevant Imaging Results:  Relevant Lab Results:   Additional Information [id number]  Cheron Schaumann, [place]

## 2018-08-16 NOTE — ED Notes (Signed)
EDP at bedside  

## 2018-08-16 NOTE — ED Notes (Signed)
Pt was provided with a food tray and a drink. Pt is able to swallow w/no difficulty.

## 2018-08-16 NOTE — ED Notes (Signed)
Per X-Ray technician pt stated felt dizzy while standing.

## 2018-08-17 ENCOUNTER — Observation Stay (HOSPITAL_BASED_OUTPATIENT_CLINIC_OR_DEPARTMENT_OTHER): Payer: Medicare Other

## 2018-08-17 DIAGNOSIS — R079 Chest pain, unspecified: Secondary | ICD-10-CM

## 2018-08-17 DIAGNOSIS — F419 Anxiety disorder, unspecified: Secondary | ICD-10-CM | POA: Diagnosis not present

## 2018-08-17 LAB — NM MYOCAR MULTI W/SPECT W/WALL MOTION / EF
CHL CUP RESTING HR STRESS: 64 {beats}/min
CSEPEDS: 1 s
CSEPPHR: 75 {beats}/min
Estimated workload: 1 METS
Exercise duration (min): 1 min
Percent HR: 51 %

## 2018-08-17 LAB — BASIC METABOLIC PANEL
ANION GAP: 14 (ref 5–15)
BUN: 33 mg/dL — ABNORMAL HIGH (ref 8–23)
CALCIUM: 9.2 mg/dL (ref 8.9–10.3)
CO2: 31 mmol/L (ref 22–32)
CREATININE: 1.42 mg/dL — AB (ref 0.61–1.24)
Chloride: 89 mmol/L — ABNORMAL LOW (ref 98–111)
GFR calc Af Amer: 58 mL/min — ABNORMAL LOW (ref >60)
GFR, EST NON AFRICAN AMERICAN: 50 mL/min — AB (ref >60)
Glucose, Bld: 349 mg/dL — ABNORMAL HIGH (ref 70–99)
POTASSIUM: 2.8 mmol/L — AB (ref 3.5–5.1)
Sodium: 134 mmol/L — ABNORMAL LOW (ref 135–145)

## 2018-08-17 LAB — MAGNESIUM: Magnesium: 1.5 mg/dL — ABNORMAL LOW (ref 1.7–2.4)

## 2018-08-17 LAB — CBC
HCT: 33.9 % — ABNORMAL LOW (ref 40.0–52.0)
HEMOGLOBIN: 12.3 g/dL — AB (ref 13.0–18.0)
MCH: 29 pg (ref 26.0–34.0)
MCHC: 36.3 g/dL — ABNORMAL HIGH (ref 32.0–36.0)
MCV: 79.9 fL — ABNORMAL LOW (ref 80.0–100.0)
PLATELETS: 213 10*3/uL (ref 150–440)
RBC: 4.24 MIL/uL — AB (ref 4.40–5.90)
RDW: 16.7 % — ABNORMAL HIGH (ref 11.5–14.5)
WBC: 7.1 10*3/uL (ref 3.8–10.6)

## 2018-08-17 LAB — GLUCOSE, CAPILLARY
GLUCOSE-CAPILLARY: 267 mg/dL — AB (ref 70–99)
GLUCOSE-CAPILLARY: 362 mg/dL — AB (ref 70–99)
Glucose-Capillary: 343 mg/dL — ABNORMAL HIGH (ref 70–99)

## 2018-08-17 LAB — POTASSIUM: Potassium: 3 mmol/L — ABNORMAL LOW (ref 3.5–5.1)

## 2018-08-17 LAB — TROPONIN I

## 2018-08-17 MED ORDER — POTASSIUM CHLORIDE CRYS ER 20 MEQ PO TBCR
40.0000 meq | EXTENDED_RELEASE_TABLET | Freq: Every day | ORAL | Status: DC
  Administered 2018-08-18: 40 meq via ORAL
  Filled 2018-08-17: qty 2

## 2018-08-17 MED ORDER — MUPIROCIN 2 % EX OINT
1.0000 "application " | TOPICAL_OINTMENT | Freq: Two times a day (BID) | CUTANEOUS | Status: DC
  Administered 2018-08-17 – 2018-08-18 (×3): 1 via NASAL
  Filled 2018-08-17: qty 22

## 2018-08-17 MED ORDER — POTASSIUM CHLORIDE CRYS ER 20 MEQ PO TBCR
40.0000 meq | EXTENDED_RELEASE_TABLET | ORAL | Status: AC
  Administered 2018-08-17: 40 meq via ORAL
  Filled 2018-08-17 (×2): qty 2

## 2018-08-17 MED ORDER — POTASSIUM CHLORIDE ER 20 MEQ PO TBCR: 20.0000 meq | EXTENDED_RELEASE_TABLET | Freq: Two times a day (BID) | ORAL | 2 refills | Status: DC

## 2018-08-17 MED ORDER — POTASSIUM CHLORIDE 10 MEQ/100ML IV SOLN
10.0000 meq | INTRAVENOUS | Status: DC
  Filled 2018-08-17 (×4): qty 100

## 2018-08-17 MED ORDER — REGADENOSON 0.4 MG/5ML IV SOLN
0.4000 mg | Freq: Once | INTRAVENOUS | Status: AC
  Administered 2018-08-17: 0.4 mg via INTRAVENOUS
  Filled 2018-08-17: qty 5

## 2018-08-17 MED ORDER — TECHNETIUM TC 99M TETROFOSMIN IV KIT
10.4250 | PACK | Freq: Once | INTRAVENOUS | Status: AC | PRN
  Administered 2018-08-17: 10.425 via INTRAVENOUS

## 2018-08-17 MED ORDER — POTASSIUM CHLORIDE 10 MEQ/100ML IV SOLN
10.0000 meq | INTRAVENOUS | Status: AC
  Administered 2018-08-17 (×3): 10 meq via INTRAVENOUS
  Filled 2018-08-17 (×4): qty 100

## 2018-08-17 MED ORDER — SODIUM CHLORIDE 0.9% FLUSH: 3.0000 mL | Freq: Two times a day (BID) | INTRAVENOUS | Status: DC

## 2018-08-17 MED ORDER — TECHNETIUM TC 99M TETROFOSMIN IV KIT
30.0000 | PACK | Freq: Once | INTRAVENOUS | Status: AC | PRN
  Administered 2018-08-17: 28.34 via INTRAVENOUS

## 2018-08-17 MED ORDER — CHLORHEXIDINE GLUCONATE CLOTH 2 % EX PADS: 6.0000 | MEDICATED_PAD | Freq: Every day | CUTANEOUS | Status: DC

## 2018-08-17 MED ORDER — FUROSEMIDE 40 MG PO TABS: 40.0000 mg | ORAL_TABLET | Freq: Two times a day (BID) | ORAL | 2 refills | Status: DC

## 2018-08-17 MED ORDER — INSULIN GLARGINE 100 UNIT/ML ~~LOC~~ SOLN
35.0000 [IU] | Freq: Every day | SUBCUTANEOUS | Status: DC
  Administered 2018-08-17: 35 [IU] via SUBCUTANEOUS
  Filled 2018-08-17 (×2): qty 0.35

## 2018-08-17 MED ORDER — INSULIN ASPART 100 UNIT/ML ~~LOC~~ SOLN
10.0000 [IU] | Freq: Three times a day (TID) | SUBCUTANEOUS | Status: DC
  Administered 2018-08-17 – 2018-08-18 (×3): 10 [IU] via SUBCUTANEOUS
  Filled 2018-08-17 (×3): qty 1

## 2018-08-17 NOTE — Care Management Obs Status (Addendum)
MEDICARE OBSERVATION STATUS NOTIFICATION   Patient Details  Name: HY SWIATEK MRN: [id number] Date of Birth: December 13, 1950   Medicare Observation Status Notification Given:  Patient unable to sign.  Has a guardian through [employer].  Drue Flirt Margo Aye is guardian; she was not in the office at the time of call.  RNCM spoke with her supervisor, Antonietta Breach.  He verbalized understanding of MOON letter.     Sherren Kerns, RN 08/17/2018, 1:44 PM

## 2018-08-17 NOTE — Progress Notes (Signed)
The potassium IVPB hurts.  I have slowed them down to 50 ml/hr.

## 2018-08-17 NOTE — Progress Notes (Signed)
Inpatient Diabetes Program Recommendations  AACE/ADA: New Consensus Statement on Inpatient Glycemic Control (2015)  Target Ranges:  Prepandial:   less than 140 mg/dL      Peak postprandial:   less than 180 mg/dL (1-2 hours)      Critically ill patients:  140 - 180 mg/dL   Results for [name], [name] (MRN [id number]) as of 08/17/2018 09:38  Ref. Range 08/16/2018 10:01 08/16/2018 16:38 08/16/2018 20:37  Glucose-Capillary Latest Ref Range: 70 - 99 mg/dL 782 (H) 956 (H)  7 units NOVOLOG  319 (H)    28 units LANTUS   Results for [name], Jason [name] (MRN [id number]) as of 08/17/2018 09:38  Ref. Range 08/17/2018 08:16  Glucose-Capillary Latest Ref Range: 70 - 99 mg/dL 469 (H)  5 units NOVOLOG     Admit with: CP (got into argument at his group home)  History: DM, CABG, Paranoid schizophrenia, Tourette disorder  Home DM Meds: Glipizide 5 mg Daily       Lantus 28 units QAM + Lantus 100 units QHS       Humalog 57 units TID with meals       Metformin 1000 mg BID  Current Orders: Lantus 28 units Daily      Novolog Sensitive Correction Scale/ SSI (0-9 units) TID AC         Glipizide 5 mg Daily     NPO this AM for Cardiac Stress test.  Currently only has orders for Lantus 28 units Daily.  Takes a total of 128 units Lantus at the group home.  CBG 267 mg/dl this AM.     MD- Please consider the following in-hospital insulin adjustments:  1. Start bedtime dose of Lantus as well-  Recommend Lantus 35 units QHS (~1/3 total dose he receives at bedtime at home)  2. May want to start Novolog Meal Coverage today after patient resumes a PO diet.  Takes a large amount of rapid-acting insulin at home with meals.  Recommend Novolog 14 units TID with meals (after patient resumes PO diet)  (Please add the following Hold Parameters: Hold if pt eats <50% of meal, Hold if pt NPO)  This dose would be about 25% total amount he takes at home with meals     --Will follow patient during  hospitalization--  Ambrose Finland RN, MSN, CDE Diabetes Coordinator Inpatient Glycemic Control Team Team Pager: [phone] (8a-5p)

## 2018-08-17 NOTE — Progress Notes (Signed)
[facility] - [place] [facility]   PATIENT NAME: Jason [name]    MR#:  [id number]  DATE OF BIRTH:  [birth year]  SUBJECTIVE:  CHIEF COMPLAINT:   Chief Complaint  Patient presents with  . Chest Pain   -Admitted with chest pain, stress test is negative -Low potassium noted.  REVIEW OF SYSTEMS:  Review of Systems  Constitutional: Negative for chills, fever and malaise/fatigue.  HENT: Negative for congestion, ear discharge, hearing loss and nosebleeds.   Eyes: Negative for blurred vision and double vision.  Respiratory: Negative for cough, shortness of breath and wheezing.   Cardiovascular: Negative for chest pain, palpitations and leg swelling.  Gastrointestinal: Negative for abdominal pain, constipation, diarrhea, nausea and vomiting.  Genitourinary: Negative for dysuria.  Musculoskeletal: Negative for myalgias.  Neurological: Negative for dizziness, focal weakness, seizures, weakness and headaches.  Psychiatric/Behavioral: Positive for depression.    DRUG ALLERGIES:   Allergies  Allergen Reactions  . Keflex [Cephalexin] Rash    VITALS:  Blood pressure (!) 154/78, pulse 72, temperature 98.2 F (36.8 C), temperature source Oral, resp. rate 19, height 6' (1.829 m), weight 130.2 kg, SpO2 100 %.  PHYSICAL EXAMINATION:  Physical Exam  GENERAL:  67 y.o.-year-old patient lying in the bed with no acute distress.  EYES: Pupils equal, round, reactive to light and accommodation. No scleral icterus. Extraocular muscles intact.  HEENT: Head atraumatic, normocephalic. Oropharynx and nasopharynx clear.  NECK:  Supple, no jugular venous distention. No thyroid enlargement, no tenderness.  LUNGS: Normal breath sounds bilaterally, no wheezing, rales,rhonchi or crepitation. No use of accessory muscles of respiration.  CARDIOVASCULAR: S1, S2 normal. No murmurs, rubs, or gallops.  ABDOMEN: Soft, nontender, nondistended. Bowel sounds present. No organomegaly or mass.    EXTREMITIES: No pedal edema, cyanosis, or clubbing.  NEUROLOGIC: Cranial nerves II through XII are intact. Muscle strength 5/5 in all extremities. Sensation intact. Gait not checked.  PSYCHIATRIC: The patient is alert and oriented x 3. Depressed, not suicidal SKIN: No obvious rash, lesion, or ulcer.    LABORATORY PANEL:   CBC Recent Labs  Lab 08/17/18 0200  WBC 7.1  HGB 12.3*  HCT 33.9*  PLT 213   ------------------------------------------------------------------------------------------------------------------  Chemistries  Recent Labs  Lab 08/17/18 0200  NA 134*  K 2.8*  CL 89*  CO2 31  GLUCOSE 349*  BUN 33*  CREATININE 1.42*  CALCIUM 9.2   ------------------------------------------------------------------------------------------------------------------  Cardiac Enzymes Recent Labs  Lab 08/17/18 0200  TROPONINI <0.03   ------------------------------------------------------------------------------------------------------------------  RADIOLOGY:  Dg Chest 2 View  Result Date: 08/16/2018 CLINICAL DATA:  Dizziness, weakness, chest pain EXAM: CHEST - 2 VIEW COMPARISON:  None. FINDINGS: There is mild bilateral interstitial thickening. There is no focal parenchymal opacity. There is no pleural effusion or pneumothorax. There is stable cardiomegaly. There is evidence of prior CABG. The osseous structures are unremarkable. IMPRESSION: Cardiomegaly with mild pulmonary vascular congestion. Electronically Signed   By: Elige Ko   On: 08/16/2018 10:54   Nm Myocar Multi W/spect W/wall Motion / Ef  Result Date: 08/17/2018  TWI at baselin  Defect 1: There is a medium defect present in the basal inferior and mid inferior location. This is likely due to diaphragm attenuation.  The study is normal.  This is a low risk study.  The left ventricular ejection fraction is normal (55-65%).     EKG:   Orders placed or performed during the hospital encounter of 08/16/18  . ED  EKG within 10 minutes  . ED  EKG within 10 minutes  . EKG 12-Lead  . EKG 12-Lead  . EKG    ASSESSMENT AND PLAN:   67 year old male with past medical history significant for paranoid schizophrenia, CAD status post CABG, insulin-dependent diabetes mellitus and hypertension presents to hospital secondary to chest pain  1.  Chest pain-secondary to anxiety.  Resolved at this time -Troponins are negative -Status post stress test which was a low risk study.  There was some fixed defect likely diaphragmatic movement.  2.  Hypokalemia-takes Lasix at home.  Not on potassium supplements.  Will start oral potassium supplements at discharge. -Being replaced yet. -Repeat check later this evening.  If normal, will discharge  3.  CAD status post CABG-stable.  Stress test is negative.  On Plavix, and statin.  4.  Diabetes mellitus-continue home medications.  Patient on glipizide, high doses of Lantus, Humalog and metformin  5.  Hypertension-Imdur, Norvasc, coreg  6.  Paranoid schizophrenia-patient is from a group home.  Patient does not want to be discharged to the same place.  Try to reach patient's guardian and left a voicemail  7. DVT Prophylaxis- lovenox    All the records are reviewed and case discussed with Care Management/Social Workerr. Management plans discussed with the patient, family and they are in agreement.  CODE STATUS: Full Code  TOTAL TIME TAKING CARE OF THIS PATIENT: 38 minutes.   POSSIBLE D/C IN 1-2 DAYS, DEPENDING ON CLINICAL CONDITION.   Ramani Riva M.D on 08/17/2018 at 2:39 PM  Between 7am to 6pm - Pager - [phone]  After 6pm go to [email or web address] - password [email or web address]  [facility]  Office  [phone]  CC: Primary care physician; Patient, No Pcp Per

## 2018-08-17 NOTE — Clinical Social Work Note (Signed)
CSW attempted to contact family care home supervisor Jimmye Norman [phone], left message on voice mail awaiting for call back.  Ervin Knack. Nasier Thumm, MSW, [facility] [phone]  08/17/2018 2:52 PM

## 2018-08-18 DIAGNOSIS — F419 Anxiety disorder, unspecified: Secondary | ICD-10-CM | POA: Diagnosis not present

## 2018-08-18 LAB — MAGNESIUM: Magnesium: 1.7 mg/dL (ref 1.7–2.4)

## 2018-08-18 LAB — GLUCOSE, CAPILLARY
GLUCOSE-CAPILLARY: 253 mg/dL — AB (ref 70–99)
Glucose-Capillary: 438 mg/dL — ABNORMAL HIGH (ref 70–99)
Glucose-Capillary: 383 mg/dL — ABNORMAL HIGH (ref 70–99)

## 2018-08-18 LAB — BASIC METABOLIC PANEL
ANION GAP: 11 (ref 5–15)
BUN: 25 mg/dL — ABNORMAL HIGH (ref 8–23)
CHLORIDE: 91 mmol/L — AB (ref 98–111)
CO2: 33 mmol/L — AB (ref 22–32)
CREATININE: 1.14 mg/dL (ref 0.61–1.24)
Calcium: 9.3 mg/dL (ref 8.9–10.3)
GFR calc non Af Amer: >60 mL/min (ref >60)
Glucose, Bld: 264 mg/dL — ABNORMAL HIGH (ref 70–99)
POTASSIUM: 2.9 mmol/L — AB (ref 3.5–5.1)
SODIUM: 135 mmol/L (ref 135–145)

## 2018-08-18 LAB — HIV ANTIBODY (ROUTINE TESTING W REFLEX): HIV SCREEN 4TH GENERATION: NONREACTIVE

## 2018-08-18 MED ORDER — MAGNESIUM SULFATE 2 GM/50ML IV SOLN
2.0000 g | Freq: Once | INTRAVENOUS | Status: DC
  Filled 2018-08-18: qty 50

## 2018-08-18 NOTE — Progress Notes (Signed)
Pt refusing to wear tele monitor/ refusing iv mag- MD made aware/ will continue to monitor.

## 2018-08-18 NOTE — Clinical Social Work Note (Signed)
Patient to be d/c'ed today to [facility].  Patient and guardian agreeable to plans will transport via group home administrator.  CSW contacted patient's legal guardian and left a message on her voice mail that patient is discharging today.  Patient stated he would prefer a different facility, CSW informed him that guardian said she was working on trying to find a different placement, but for now he has to return to the family care home he is currently residing in.  Patient expressed disappointment, but is accepting that he has to return.  CSW faxed FL2 and discharge summary to patient's legal guardian Zipporah Plants, fax number is [phone] legal guardian's phone number is [phone].  CSW spoke with administrator Jimmye Norman [phone] of family care home and she requested fL2 and discharge summary be faxed to [phone], which CSW faxed information to.  Windell Moulding, MSW, [facility]ors [phone]

## 2018-08-18 NOTE — Discharge Summary (Signed)
[employer] [facility] [place] at [facility]   PATIENT NAME: Jason [name]    MR#:  [id number]  DATE OF BIRTH:  [birth year]  DATE OF ADMISSION:  08/16/2018   ADMITTING PHYSICIAN: Jason Hamming, MD  DATE OF DISCHARGE:  08/18/18  PRIMARY CARE PHYSICIAN: Jason [name]   ADMISSION DIAGNOSIS:   Hypokalemia [E87.6] Near syncope [R55] Nonspecific chest pain [R07.9]  DISCHARGE DIAGNOSIS:   Active Problems:   Hypokalemia due to inadequate potassium intake   SECONDARY DIAGNOSIS:   Past Medical History:  Diagnosis Date  . Diabetes mellitus without complication (HCC)   . GERD (gastroesophageal reflux disease)   . Hypertension   . Paranoid schizophrenia (HCC)   . Tourette disorder     HOSPITAL COURSE:   67 year old male with past medical history significant for paranoid schizophrenia, CAD status post CABG, insulin-dependent diabetes mellitus and hypertension presents to hospital secondary to chest pain  1.  Chest pain-secondary to anxiety.  Resolved at this time -Troponins are negative -Status post stress test which was a low risk study.  There was some fixed defect likely diaphragmatic movement.  2.  Hypokalemia-takes Lasix at home.  Not on potassium supplements.  Will start oral potassium supplements at discharge. -Being replaced here.  3.  CAD status post CABG-stable.  Stress test is negative.  On Plavix, and statin.  4.  Diabetes mellitus-continue home medications.  Patient on glipizide, high doses of Lantus, Humalog and metformin  5.  Hypertension-Imdur, Norvasc, coreg  6.  Paranoid schizophrenia-patient is from a group home.  Patient does not want to be discharged to the same place.  -Discussed with his guardian Ms. Jason [name] and also discussed with on-call psychiatrist.  Patient has had prior admissions at the end he states he does not want to go back to the group home.  Guardian is okay with him going back to the same group home.   Discussed with Child psychotherapist as well.  Patient will be discharged today.   Patient has multiple ER visits for different psychosomatic complaints to avoid staying at the group home   DISCHARGE CONDITIONS:   Guarded  CONSULTS OBTAINED:   None  DRUG ALLERGIES:   Allergies  Allergen Reactions  . Keflex [Cephalexin] Rash   DISCHARGE MEDICATIONS:   Allergies as of 08/18/2018      Reactions   Keflex [cephalexin] Rash      Medication List    STOP taking these medications   Potassium Chloride CR 8 MEQ Cpcr capsule CR Commonly known as:  MICRO-K Replaced by:  Potassium Chloride ER 20 MEQ Tbcr     TAKE these medications   acetaminophen 500 MG tablet Commonly known as:  TYLENOL Take 500 mg by mouth every 4 (four) hours as needed for mild pain or fever.   amLODipine 10 MG tablet Commonly known as:  NORVASC Take 10 mg by mouth daily.   aspirin 325 MG tablet Take 325 mg by mouth daily.   atorvastatin 80 MG tablet Commonly known as:  LIPITOR Take 80 mg by mouth daily.   benztropine 0.5 MG tablet Commonly known as:  COGENTIN Take 0.5 mg by mouth 2 (two) times daily.   carvedilol 6.25 MG tablet Commonly known as:  COREG Take 6.25 mg by mouth 2 (two) times daily with a meal.   ciprofloxacin 500 MG tablet Commonly known as:  CIPRO Take 500 mg by mouth 2 (two) times daily.   clopidogrel 75 MG tablet Commonly known as:  PLAVIX  Take 75 mg by mouth daily.   DIALYVITE VITAMIN D3 MAX 04540 units Tabs Generic drug:  Cholecalciferol Take 50,000 Units by mouth every 7 (seven) days.   divalproex 500 MG 24 hr tablet Commonly known as:  DEPAKOTE ER Take 1,000 mg by mouth daily.   furosemide 40 MG tablet Commonly known as:  LASIX Take 1 tablet (40 mg total) by mouth 2 (two) times daily. What changed:    when to take this  Another medication with the same name was removed. Continue taking this medication, and follow the directions you see here.   gabapentin 300 MG  capsule Commonly known as:  NEURONTIN Take 300 mg by mouth 3 (three) times daily.   glipiZIDE 5 MG tablet Commonly known as:  GLUCOTROL Take 5 mg by mouth daily before breakfast.   guaiFENesin 100 MG/5ML Soln Commonly known as:  ROBITUSSIN Take 10 mLs by mouth every 6 (six) hours as needed for cough or to loosen phlegm.   insulin glargine 100 UNIT/ML injection Commonly known as:  LANTUS Inject 100 Units into the skin at bedtime.   insulin glargine 100 UNIT/ML injection Commonly known as:  LANTUS Inject 28 Units into the skin daily.   insulin lispro 100 UNIT/ML injection Commonly known as:  HUMALOG Inject 57 Units into the skin 3 (three) times daily before meals.   isosorbide mononitrate 60 MG 24 hr tablet Commonly known as:  IMDUR Take 60 mg by mouth daily.   loperamide 2 MG capsule Commonly known as:  IMODIUM Take 2 mg by mouth as needed for diarrhea or loose stools (max 4 doses in 24 hours).   LORazepam 0.5 MG tablet Commonly known as:  ATIVAN Take 0.5 mg by mouth every 6 (six) hours as needed for anxiety (agitation).   magnesium hydroxide 400 MG/5ML suspension Commonly known as:  MILK OF MAGNESIA Take 30 mLs by mouth daily as needed for mild constipation.   metFORMIN 1000 MG tablet Commonly known as:  GLUCOPHAGE Take 1,000 mg by mouth 2 (two) times daily with a meal.   metolazone 2.5 MG tablet Commonly known as:  ZAROXOLYN Take 2.5 mg by mouth daily.   MINTOX REGULAR STRENGTH 200-200-20 MG/5ML suspension Generic drug:  alum & mag hydroxide-simeth Take 15-30 mLs by mouth as needed for indigestion or heartburn.   neomycin-bacitracin-polymyxin [phone] ointment Apply 1 application topically as needed.   nitroGLYCERIN 0.4 MG SL tablet Commonly known as:  NITROSTAT Place 0.4 mg under the tongue every 5 (five) minutes as needed for chest pain.   Pimozide 1 MG Tabs Take 1 mg by mouth 2 (two) times daily.   Potassium Chloride ER 20 MEQ Tbcr Take 20 mEq by  mouth 2 (two) times daily. Replaces:  Potassium Chloride CR 8 MEQ Cpcr capsule CR   risperiDONE 1 MG tablet Commonly known as:  RISPERDAL Take 1.5 mg by mouth 2 (two) times daily.   tamsulosin 0.4 MG Caps capsule Commonly known as:  FLOMAX Take 0.4 mg by mouth daily after breakfast.        DISCHARGE INSTRUCTIONS:   1.  Psychiatry follow-up within a week 2.  PCP follow-up within 1 week  DIET:   Cardiac diet and Diabetic diet  ACTIVITY:   Activity as tolerated  OXYGEN:   Home Oxygen: No.  Oxygen Delivery: room air  DISCHARGE LOCATION:   group home   If you experience worsening of your admission symptoms, develop shortness of breath, life threatening emergency, suicidal or homicidal thoughts you must  seek medical attention immediately by calling 911 or calling your MD immediately  if symptoms less severe.  You Must read complete instructions/literature along with all the possible adverse reactions/side effects for all the Medicines you take and that have been prescribed to you. Take any new Medicines after you have completely understood and accpet all the possible adverse reactions/side effects.   Please note  You were cared for by a hospitalist during your hospital stay. If you have any questions about your discharge medications or the care you received while you were in the hospital after you are discharged, you can call the unit and asked to speak with the hospitalist on call if the hospitalist that took care of you is not available. Once you are discharged, your primary care physician will handle any further medical issues. Please note that NO REFILLS for any discharge medications will be authorized once you are discharged, as it is imperative that you return to your primary care physician (or establish a relationship with a primary care physician if you do not have one) for your aftercare needs so that they can reassess your need for medications and monitor your lab  values.    On the day of Discharge:  VITAL SIGNS:   Blood pressure (!) 142/108, pulse 69, temperature (!) 97.5 F (36.4 C), temperature source Oral, resp. rate 18, height 6' (1.829 m), weight 128.9 kg, SpO2 100 %.  PHYSICAL EXAMINATION:   GENERAL:  67 y.o.-year-old patient lying in the bed with no acute distress.  EYES: Pupils equal, round, reactive to light and accommodation. No scleral icterus. Extraocular muscles intact.  HEENT: Head atraumatic, normocephalic. Oropharynx and nasopharynx clear.  NECK:  Supple, no jugular venous distention. No thyroid enlargement, no tenderness.  LUNGS: Normal breath sounds bilaterally, no wheezing, rales,rhonchi or crepitation. No use of accessory muscles of respiration.  CARDIOVASCULAR: S1, S2 normal. No murmurs, rubs, or gallops.  ABDOMEN: Soft, nontender, nondistended. Bowel sounds present. No organomegaly or mass.  EXTREMITIES: No pedal edema, cyanosis, or clubbing.  NEUROLOGIC: Cranial nerves II through XII are intact. Muscle strength 5/5 in all extremities. Sensation intact. Gait not checked.  PSYCHIATRIC: The patient is alert and oriented x 3. agitated SKIN: No obvious rash, lesion, or ulcer.   DATA REVIEW:   CBC Recent Labs  Lab 08/17/18 0200  WBC 7.1  HGB 12.3*  HCT 33.9*  PLT 213    Chemistries  Recent Labs  Lab 08/18/18 0741  NA 135  K 2.9*  CL 91*  CO2 33*  GLUCOSE 264*  BUN 25*  CREATININE 1.14  CALCIUM 9.3  MG 1.7     Microbiology Results  Results for orders placed or performed during the hospital encounter of 08/16/18  MRSA PCR Screening     Status: Abnormal   Collection Time: 08/16/18  4:54 PM  Result Value Ref Range Status   MRSA by PCR POSITIVE (A) NEGATIVE Final    Comment:        The GeneXpert MRSA Assay (FDA approved for NASAL specimens only), is one component of a comprehensive MRSA colonization surveillance program. It is not intended to diagnose MRSA infection nor to guide or monitor  treatment for MRSA infections. RESULT CALLED TO, READ BACK BY AND VERIFIED WITH: CLAIRE DANIEL 08/16/18 @ 1830  MLK Performed at [facility], [address] [place], [place] [address]     RADIOLOGY:  No results found.   Management plans discussed with the patient, family and they are in agreement.  CODE STATUS:     Code Status Orders  (From admission, onward)         Start     Ordered   08/16/18 1410  Full code  Continuous     08/16/18 1410        Code Status History    This patient has a current code status but no historical code status.      TOTAL TIME TAKING CARE OF THIS PATIENT: 38  minutes.    Jamesia Linnen M.D on 08/18/2018 at 1:07 PM  Between 7am to 6pm - Pager - [phone]  After 6pm go to [email or web address] - Social research officer, government  [facility] [place] [facility]  Office  [phone]  CC: Primary care physician; Jason [name]   Note: This dictation was prepared with Dragon dictation along with smaller phrase technology. Any transcriptional errors that result from this process are unintentional.

## 2018-08-18 NOTE — NC FL2 (Signed)
[place] MEDICAID FL2 LEVEL OF CARE SCREENING TOOL     IDENTIFICATION  Patient Name: Jason [name] Birthdate: 1951-05-04 Sex: male Admission Date (Current Location): 08/16/2018  [facility] and [place] Number:  Chiropodist and Address:  [facility], [address], [place], [place] [address]      Provider Number: [id number]  Attending Physician Name and Address:  Enid Baas, MD  Relative Name and Phone Number:       Current Level of Care: Hospital Recommended Level of Care: [facility] Prior Approval Number:    Date Approved/Denied:   PASRR Number:    Discharge Plan: Domiciliary (Rest home)    Current Diagnoses: Patient Active Problem List   Diagnosis Date Noted  . Hypokalemia due to inadequate potassium intake 08/16/2018  . Hypertension 08/07/2018  . Malingering 08/07/2018  . Intellectual disability 08/07/2018  . Paranoid schizophrenia (HCC) 07/23/2018  . Diabetes mellitus without complication (HCC) 07/23/2018  . Tourette disorder 07/23/2018    Orientation RESPIRATION BLADDER Height & Weight     Self, Situation, Place, Time  Normal Continent Weight: 284 lb 1.6 oz (128.9 kg) Height:  6' (182.9 cm)  BEHAVIORAL SYMPTOMS/MOOD NEUROLOGICAL BOWEL NUTRITION STATUS      Continent Diet(Diabetic)  AMBULATORY STATUS COMMUNICATION OF NEEDS Skin   Limited Assist Verbally Normal                       Personal Care Assistance Level of Assistance  Bathing, Feeding, Dressing, Total care Bathing Assistance: Limited assistance Feeding assistance: Independent Dressing Assistance: Independent Total Care Assistance: Limited assistance   Functional Limitations Info  Sight, Hearing, Speech Sight Info: Adequate Hearing Info: Impaired Speech Info: Adequate    SPECIAL CARE FACTORS FREQUENCY                       Contractures Contractures Info: Not present    Additional Factors Info  Insulin Sliding Scale,  Allergies, Psychotropic   Allergies Info: Keflex Cephalaxin Psychotropic Info: See MAR Insulin Sliding Scale Info: See MAR       Current Medications (08/18/2018):  This is the current hospital active medication list Current Facility-Administered Medications  Medication Dose Route Frequency Provider Last Rate Last Dose  . acetaminophen (TYLENOL) tablet 650 mg  650 mg Oral Q6H PRN Katha Hamming, MD       Or  . acetaminophen (TYLENOL) suppository 650 mg  650 mg Rectal Q6H PRN Katha Hamming, MD      . amLODipine (NORVASC) tablet 10 mg  10 mg Oral Daily Katha Hamming, MD   10 mg at 08/18/18 1041  . aspirin tablet 325 mg  325 mg Oral Daily Katha Hamming, MD   325 mg at 08/18/18 1045  . atorvastatin (LIPITOR) tablet 80 mg  80 mg Oral Daily Katha Hamming, MD   80 mg at 08/17/18 2130  . benztropine (COGENTIN) tablet 0.5 mg  0.5 mg Oral BID Katha Hamming, MD   0.5 mg at 08/18/18 1045  . bisacodyl (DULCOLAX) EC tablet 5 mg  5 mg Oral Daily PRN Katha Hamming, MD      . carvedilol (COREG) tablet 6.25 mg  6.25 mg Oral BID WC Katha Hamming, MD   6.25 mg at 08/18/18 1040  . Chlorhexidine Gluconate Cloth 2 % PADS 6 each  6 each Topical Q0600 Katha Hamming, MD      . clopidogrel (PLAVIX) tablet 75 mg  75 mg Oral Daily [place], [place],  MD   75 mg at 08/18/18 1041  . divalproex (DEPAKOTE ER) 24 hr tablet 1,000 mg  1,000 mg Oral Daily Katha Hamming, MD   1,000 mg at 08/18/18 1045  . docusate sodium (COLACE) capsule 100 mg  100 mg Oral BID Katha Hamming, MD   100 mg at 08/18/18 1041  . enoxaparin (LOVENOX) injection 40 mg  40 mg Subcutaneous Q24H Katha Hamming, MD   40 mg at 08/17/18 2130  . furosemide (LASIX) tablet 40 mg  40 mg Oral Daily Katha Hamming, MD   40 mg at 08/17/18 1743  . furosemide (LASIX) tablet 60 mg  60 mg Oral Daily Katha Hamming, MD   60 mg at 08/18/18 1041  . gabapentin (NEURONTIN) capsule  300 mg  300 mg Oral TID Katha Hamming, MD   300 mg at 08/18/18 1040  . glipiZIDE (GLUCOTROL) tablet 5 mg  5 mg Oral QAC breakfast Katha Hamming, MD   5 mg at 08/18/18 1045  . HYDROcodone-acetaminophen (NORCO/VICODIN) 5-325 MG per tablet 1-2 tablet  1-2 tablet Oral Q4H PRN Katha Hamming, MD   2 tablet at 08/18/18 1041  . insulin aspart (novoLOG) injection 0-9 Units  0-9 Units Subcutaneous TID WC Katha Hamming, MD   9 Units at 08/18/18 1325  . insulin aspart (novoLOG) injection 10 Units  10 Units Subcutaneous TID WC Enid Baas, MD   10 Units at 08/18/18 1325  . insulin glargine (LANTUS) injection 28 Units  28 Units Subcutaneous Daily Katha Hamming, MD   28 Units at 08/18/18 1045  . insulin glargine (LANTUS) injection 35 Units  35 Units Subcutaneous QHS Enid Baas, MD   35 Units at 08/17/18 2132  . isosorbide mononitrate (IMDUR) 24 hr tablet 60 mg  60 mg Oral Daily Katha Hamming, MD   60 mg at 08/18/18 1040  . magnesium hydroxide (MILK OF MAGNESIA) suspension 30 mL  30 mL Oral Daily PRN Katha Hamming, MD      . magnesium sulfate IVPB 2 g 50 mL  2 g Intravenous Once Enid Baas, MD      . metolazone (ZAROXOLYN) tablet 2.5 mg  2.5 mg Oral Daily Katha Hamming, MD   2.5 mg at 08/18/18 1045  . mupirocin ointment (BACTROBAN) 2 % 1 application  1 application Nasal BID Katha Hamming, MD   1 application at 08/18/18 1046  . nitroGLYCERIN (NITROSTAT) SL tablet 0.4 mg  0.4 mg Sublingual Q5 min PRN Katha Hamming, MD      . ondansetron (ZOFRAN) tablet 4 mg  4 mg Oral Q6H PRN Katha Hamming, MD       Or  . ondansetron (ZOFRAN) injection 4 mg  4 mg Intravenous Q6H PRN Katha Hamming, MD      . pimozide (ORAP) tablet 1 mg  1 mg Oral BID Katha Hamming, MD   1 mg at 08/18/18 1045  . potassium chloride SA (K-DUR,KLOR-CON) CR tablet 40 mEq  40 mEq Oral Daily Enid Baas, MD   40 mEq at 08/18/18 1040  .  risperiDONE (RISPERDAL) tablet 0.5 mg  0.5 mg Oral BID Katha Hamming, MD   0.5 mg at 08/18/18 1045  . sodium chloride flush (NS) 0.9 % injection 3 mL  3 mL Intravenous Q12H Enid Baas, MD      . tamsulosin (FLOMAX) capsule 0.4 mg  0.4 mg Oral QPC breakfast Katha Hamming, MD   0.4 mg at 08/18/18 1040  . traZODone (DESYREL) tablet 25 mg  25 mg Oral QHS PRN Katha Hamming, MD  Discharge Medications: Please see discharge summary for a list of discharge medications.    STOP taking these medications   Potassium Chloride CR 8 MEQ Cpcr capsule CR Commonly known as:  MICRO-K Replaced by:  Potassium Chloride ER 20 MEQ Tbcr     TAKE these medications   acetaminophen 500 MG tablet Commonly known as:  TYLENOL Take 500 mg by mouth every 4 (four) hours as needed for mild pain or fever.   amLODipine 10 MG tablet Commonly known as:  NORVASC Take 10 mg by mouth daily.   aspirin 325 MG tablet Take 325 mg by mouth daily.   atorvastatin 80 MG tablet Commonly known as:  LIPITOR Take 80 mg by mouth daily.   benztropine 0.5 MG tablet Commonly known as:  COGENTIN Take 0.5 mg by mouth 2 (two) times daily.   carvedilol 6.25 MG tablet Commonly known as:  COREG Take 6.25 mg by mouth 2 (two) times daily with a meal.   ciprofloxacin 500 MG tablet Commonly known as:  CIPRO Take 500 mg by mouth 2 (two) times daily.   clopidogrel 75 MG tablet Commonly known as:  PLAVIX Take 75 mg by mouth daily.   DIALYVITE VITAMIN D3 MAX 81191 units Tabs Generic drug:  Cholecalciferol Take 50,000 Units by mouth every 7 (seven) days.   divalproex 500 MG 24 hr tablet Commonly known as:  DEPAKOTE ER Take 1,000 mg by mouth daily.   furosemide 40 MG tablet Commonly known as:  LASIX Take 1 tablet (40 mg total) by mouth 2 (two) times daily. What changed:    when to take this  Another medication with the same name was removed. Continue taking this medication, and  follow the directions you see here.   gabapentin 300 MG capsule Commonly known as:  NEURONTIN Take 300 mg by mouth 3 (three) times daily.   glipiZIDE 5 MG tablet Commonly known as:  GLUCOTROL Take 5 mg by mouth daily before breakfast.   guaiFENesin 100 MG/5ML Soln Commonly known as:  ROBITUSSIN Take 10 mLs by mouth every 6 (six) hours as needed for cough or to loosen phlegm.   insulin glargine 100 UNIT/ML injection Commonly known as:  LANTUS Inject 100 Units into the skin at bedtime.   insulin glargine 100 UNIT/ML injection Commonly known as:  LANTUS Inject 28 Units into the skin daily.   insulin lispro 100 UNIT/ML injection Commonly known as:  HUMALOG Inject 57 Units into the skin 3 (three) times daily before meals.   isosorbide mononitrate 60 MG 24 hr tablet Commonly known as:  IMDUR Take 60 mg by mouth daily.   loperamide 2 MG capsule Commonly known as:  IMODIUM Take 2 mg by mouth as needed for diarrhea or loose stools (max 4 doses in 24 hours).   LORazepam 0.5 MG tablet Commonly known as:  ATIVAN Take 0.5 mg by mouth every 6 (six) hours as needed for anxiety (agitation).   magnesium hydroxide 400 MG/5ML suspension Commonly known as:  MILK OF MAGNESIA Take 30 mLs by mouth daily as needed for mild constipation.   metFORMIN 1000 MG tablet Commonly known as:  GLUCOPHAGE Take 1,000 mg by mouth 2 (two) times daily with a meal.   metolazone 2.5 MG tablet Commonly known as:  ZAROXOLYN Take 2.5 mg by mouth daily.   MINTOX REGULAR STRENGTH 200-200-20 MG/5ML suspension Generic drug:  alum & mag hydroxide-simeth Take 15-30 mLs by mouth as needed for indigestion or heartburn.   neomycin-bacitracin-polymyxin [phone] ointment Apply 1  application topically as needed.   nitroGLYCERIN 0.4 MG SL tablet Commonly known as:  NITROSTAT Place 0.4 mg under the tongue every 5 (five) minutes as needed for chest pain.   Pimozide 1 MG Tabs Take 1 mg by mouth 2  (two) times daily.   Potassium Chloride ER 20 MEQ Tbcr Take 20 mEq by mouth 2 (two) times daily. Replaces:  Potassium Chloride CR 8 MEQ Cpcr capsule CR   risperiDONE 1 MG tablet Commonly known as:  RISPERDAL Take 1.5 mg by mouth 2 (two) times daily.   tamsulosin 0.4 MG Caps capsule Commonly known as:  FLOMAX Take 0.4 mg by mouth daily after breakfast.     Relevant Imaging Results:  Relevant Lab Results:   Additional Information [id number]  [place]

## 2018-08-18 NOTE — Progress Notes (Signed)
Discharge instructions explained to [facility] of group home/ verbalized an understanding/  Pt removed his own IV / transported off unit via wheelchair.

## 2018-08-18 NOTE — Progress Notes (Signed)
Inpatient Diabetes Program Recommendations  AACE/ADA: New Consensus Statement on Inpatient Glycemic Control (2015)  Target Ranges:  Prepandial:   less than 140 mg/dL      Peak postprandial:   less than 180 mg/dL (1-2 hours)      Critically ill patients:  140 - 180 mg/dL   Results for Finau, CHUKWUKA FESTA (MRN [id number]) as of 08/18/2018 12:41  Ref. Range 08/17/2018 08:16 08/17/2018 17:11 08/17/2018 20:52  Glucose-Capillary Latest Ref Range: 70 - 99 mg/dL 409 (H)  5 units NOVOLOG  362 (H)  19 units NOVOLOG  343 (H)    35 units LANTUS   Results for Cahall, BAKARI NIKOLAI (MRN [id number]) as of 08/18/2018 12:41  Ref. Range 08/18/2018 07:37 08/18/2018 11:56  Glucose-Capillary Latest Ref Range: 70 - 99 mg/dL 956 (H)  15 units NOVOLOG given at 1042am 438 (H)    28 units LANTUS    Admit with: CP (got into argument at his group home)  History: DM, CABG, Paranoid schizophrenia, Tourette disorder  Home DM Meds: Glipizide 5 mg Daily                             Lantus 28 units QAM + Lantus 100 units QHS                             Humalog 57 units TID with meals                             Metformin 1000 mg BID  Current Orders: Lantus 28 units Daily/ Lantus 35 units QHS                            Novolog Sensitive Correction Scale/ SSI (0-9 units) TID AC                            Glipizide 5 mg Daily      Novolog 10 units TID with meals      Note bedtime dose of Lantus started last PM.  Also note Novolog 10 units Meal Coverage started yesterday at 5pm with dinner.  CBGs still quite elevated today.    MD- Please consider the following in-hospital insulin adjustments:  1. Increase bedtime dose of Lantus to 50 units QHS (50% total home bedtime dose)  2. Increase Novolog Meal Coverage to: Novolog 20 units TID with meals (~1/3 total home dose)     --Will follow patient during hospitalization--  Ambrose Finland RN, MSN, CDE Diabetes Coordinator Inpatient Glycemic  Control Team Team Pager: [phone] (8a-5p)

## 2018-08-25 ENCOUNTER — Observation Stay
Admission: EM | Admit: 2018-08-25 | Discharge: 2018-08-26 | Disposition: A | Discharge status: Home / Self Care | Payer: Medicare Other | Attending: Internal Medicine | Admitting: Internal Medicine

## 2018-08-25 ENCOUNTER — Emergency Department: Payer: Medicare Other

## 2018-08-25 ENCOUNTER — Other Ambulatory Visit: Payer: Self-pay

## 2018-08-25 ENCOUNTER — Encounter: Payer: Self-pay | Admitting: Emergency Medicine

## 2018-08-25 ENCOUNTER — Observation Stay: Payer: Medicare Other

## 2018-08-25 DIAGNOSIS — I4510 Unspecified right bundle-branch block: Secondary | ICD-10-CM | POA: Insufficient documentation

## 2018-08-25 DIAGNOSIS — F79 Unspecified intellectual disabilities: Secondary | ICD-10-CM | POA: Diagnosis not present

## 2018-08-25 DIAGNOSIS — Z881 Allergy status to other antibiotic agents status: Secondary | ICD-10-CM | POA: Diagnosis not present

## 2018-08-25 DIAGNOSIS — Z765 Malingerer [conscious simulation]: Secondary | ICD-10-CM | POA: Insufficient documentation

## 2018-08-25 DIAGNOSIS — F952 Tourette's disorder: Secondary | ICD-10-CM | POA: Diagnosis not present

## 2018-08-25 DIAGNOSIS — H9190 Unspecified hearing loss, unspecified ear: Secondary | ICD-10-CM | POA: Insufficient documentation

## 2018-08-25 DIAGNOSIS — Z951 Presence of aortocoronary bypass graft: Secondary | ICD-10-CM | POA: Diagnosis not present

## 2018-08-25 DIAGNOSIS — Z79899 Other long term (current) drug therapy: Secondary | ICD-10-CM | POA: Diagnosis not present

## 2018-08-25 DIAGNOSIS — E119 Type 2 diabetes mellitus without complications: Secondary | ICD-10-CM | POA: Insufficient documentation

## 2018-08-25 DIAGNOSIS — I4891 Unspecified atrial fibrillation: Secondary | ICD-10-CM | POA: Insufficient documentation

## 2018-08-25 DIAGNOSIS — Z7982 Long term (current) use of aspirin: Secondary | ICD-10-CM | POA: Insufficient documentation

## 2018-08-25 DIAGNOSIS — R42 Dizziness and giddiness: Principal | ICD-10-CM

## 2018-08-25 DIAGNOSIS — Z7902 Long term (current) use of antithrombotics/antiplatelets: Secondary | ICD-10-CM | POA: Diagnosis not present

## 2018-08-25 DIAGNOSIS — K219 Gastro-esophageal reflux disease without esophagitis: Secondary | ICD-10-CM | POA: Insufficient documentation

## 2018-08-25 DIAGNOSIS — J209 Acute bronchitis, unspecified: Secondary | ICD-10-CM | POA: Diagnosis not present

## 2018-08-25 DIAGNOSIS — F200 Paranoid schizophrenia: Secondary | ICD-10-CM | POA: Diagnosis present

## 2018-08-25 DIAGNOSIS — E876 Hypokalemia: Secondary | ICD-10-CM | POA: Insufficient documentation

## 2018-08-25 DIAGNOSIS — Z87891 Personal history of nicotine dependence: Secondary | ICD-10-CM | POA: Insufficient documentation

## 2018-08-25 DIAGNOSIS — Z794 Long term (current) use of insulin: Secondary | ICD-10-CM | POA: Insufficient documentation

## 2018-08-25 DIAGNOSIS — E079 Disorder of thyroid, unspecified: Secondary | ICD-10-CM | POA: Diagnosis not present

## 2018-08-25 DIAGNOSIS — R55 Syncope and collapse: Secondary | ICD-10-CM | POA: Diagnosis present

## 2018-08-25 DIAGNOSIS — W19XXXA Unspecified fall, initial encounter: Secondary | ICD-10-CM | POA: Insufficient documentation

## 2018-08-25 DIAGNOSIS — I2510 Atherosclerotic heart disease of native coronary artery without angina pectoris: Secondary | ICD-10-CM | POA: Diagnosis not present

## 2018-08-25 DIAGNOSIS — I10 Essential (primary) hypertension: Secondary | ICD-10-CM | POA: Diagnosis present

## 2018-08-25 LAB — AMMONIA: AMMONIA: 31 umol/L (ref 9–35)

## 2018-08-25 LAB — URINALYSIS, COMPLETE (UACMP) WITH MICROSCOPIC
BACTERIA UA: NONE SEEN
Bilirubin Urine: NEGATIVE
Glucose, UA: >=500 mg/dL — AB
Ketones, ur: NEGATIVE mg/dL
Leukocytes, UA: NEGATIVE
Nitrite: NEGATIVE
PROTEIN: NEGATIVE mg/dL
SPECIFIC GRAVITY, URINE: 1.006 (ref 1.005–1.030)
SQUAMOUS EPITHELIAL / LPF: NONE SEEN (ref 0–5)
pH: 7 (ref 5.0–8.0)

## 2018-08-25 LAB — COMPREHENSIVE METABOLIC PANEL
ALBUMIN: 3.6 g/dL (ref 3.5–5.0)
ALK PHOS: 59 U/L (ref 38–126)
ALT: 16 U/L (ref 0–44)
ANION GAP: 13 (ref 5–15)
AST: 27 U/L (ref 15–41)
BILIRUBIN TOTAL: 0.7 mg/dL (ref 0.3–1.2)
BUN: 19 mg/dL (ref 8–23)
CALCIUM: 9.5 mg/dL (ref 8.9–10.3)
CO2: 32 mmol/L (ref 22–32)
CREATININE: 0.97 mg/dL (ref 0.61–1.24)
Chloride: 90 mmol/L — ABNORMAL LOW (ref 98–111)
GFR calc Af Amer: >60 mL/min (ref >60)
GFR calc non Af Amer: >60 mL/min (ref >60)
Glucose, Bld: 267 mg/dL — ABNORMAL HIGH (ref 70–99)
Potassium: 3.4 mmol/L — ABNORMAL LOW (ref 3.5–5.1)
Sodium: 135 mmol/L (ref 135–145)
Total Protein: 7.5 g/dL (ref 6.5–8.1)

## 2018-08-25 LAB — CBC
HEMATOCRIT: 38.4 % — AB (ref 39.0–52.0)
Hemoglobin: 12.8 g/dL — ABNORMAL LOW (ref 13.0–17.0)
MCH: 27.6 pg (ref 26.0–34.0)
MCHC: 33.3 g/dL (ref 30.0–36.0)
MCV: 82.9 fL (ref 80.0–100.0)
Platelets: 224 10*3/uL (ref 150–400)
RBC: 4.63 MIL/uL (ref 4.22–5.81)
RDW: 15.1 % (ref 11.5–15.5)
WBC: 7.2 10*3/uL (ref 4.0–10.5)
nRBC: 0 % (ref 0.0–0.2)

## 2018-08-25 LAB — GLUCOSE, CAPILLARY
GLUCOSE-CAPILLARY: 218 mg/dL — AB (ref 70–99)
Glucose-Capillary: 183 mg/dL — ABNORMAL HIGH (ref 70–99)

## 2018-08-25 LAB — TROPONIN I

## 2018-08-25 LAB — VALPROIC ACID LEVEL: VALPROIC ACID LVL: 20 ug/mL — AB (ref 50.0–100.0)

## 2018-08-25 MED ORDER — EZETIMIBE 10 MG PO TABS
10.0000 mg | ORAL_TABLET | Freq: Every day | ORAL | Status: DC
  Administered 2018-08-26: 10 mg via ORAL
  Filled 2018-08-25: qty 1

## 2018-08-25 MED ORDER — DIVALPROEX SODIUM ER 250 MG PO TB24
1000.0000 mg | ORAL_TABLET | Freq: Every day | ORAL | Status: DC
  Filled 2018-08-25: qty 4

## 2018-08-25 MED ORDER — AMLODIPINE BESYLATE 5 MG PO TABS
10.0000 mg | ORAL_TABLET | Freq: Every day | ORAL | Status: DC
  Administered 2018-08-26: 10 mg via ORAL
  Filled 2018-08-25: qty 2

## 2018-08-25 MED ORDER — ISOSORBIDE MONONITRATE ER 60 MG PO TB24: 60.0000 mg | ORAL_TABLET | Freq: Every day | ORAL | Status: DC

## 2018-08-25 MED ORDER — CHLORHEXIDINE GLUCONATE CLOTH 2 % EX PADS: 6.0000 | MEDICATED_PAD | Freq: Every day | CUTANEOUS | Status: DC

## 2018-08-25 MED ORDER — INSULIN ASPART 100 UNIT/ML ~~LOC~~ SOLN
0.0000 [IU] | Freq: Three times a day (TID) | SUBCUTANEOUS | Status: DC
  Administered 2018-08-25: 3 [IU] via SUBCUTANEOUS
  Administered 2018-08-26 (×2): 5 [IU] via SUBCUTANEOUS
  Filled 2018-08-25 (×3): qty 1

## 2018-08-25 MED ORDER — MUPIROCIN 2 % EX OINT
1.0000 "application " | TOPICAL_OINTMENT | Freq: Two times a day (BID) | CUTANEOUS | Status: DC
  Administered 2018-08-25: 1 via NASAL
  Filled 2018-08-25 (×2): qty 22

## 2018-08-25 MED ORDER — GLIPIZIDE 5 MG PO TABS
5.0000 mg | ORAL_TABLET | Freq: Every day | ORAL | Status: DC
  Administered 2018-08-26: 5 mg via ORAL
  Filled 2018-08-25: qty 1

## 2018-08-25 MED ORDER — LEVOFLOXACIN 500 MG PO TABS
500.0000 mg | ORAL_TABLET | Freq: Every day | ORAL | Status: DC
  Administered 2018-08-25 – 2018-08-26 (×2): 500 mg via ORAL
  Filled 2018-08-25 (×2): qty 1

## 2018-08-25 MED ORDER — INSULIN ASPART 100 UNIT/ML ~~LOC~~ SOLN
10.0000 [IU] | Freq: Three times a day (TID) | SUBCUTANEOUS | Status: DC
  Administered 2018-08-25 – 2018-08-26 (×3): 10 [IU] via SUBCUTANEOUS
  Filled 2018-08-25 (×3): qty 1

## 2018-08-25 MED ORDER — METFORMIN HCL 500 MG PO TABS
1000.0000 mg | ORAL_TABLET | Freq: Two times a day (BID) | ORAL | Status: DC
  Administered 2018-08-25 – 2018-08-26 (×2): 1000 mg via ORAL
  Filled 2018-08-25 (×2): qty 2

## 2018-08-25 MED ORDER — HYDROCODONE-ACETAMINOPHEN 5-325 MG PO TABS: 1.0000 | ORAL_TABLET | ORAL | Status: DC | PRN

## 2018-08-25 MED ORDER — DOCUSATE SODIUM 100 MG PO CAPS
100.0000 mg | ORAL_CAPSULE | Freq: Two times a day (BID) | ORAL | Status: DC
  Administered 2018-08-25 – 2018-08-26 (×2): 100 mg via ORAL
  Filled 2018-08-25 (×2): qty 1

## 2018-08-25 MED ORDER — AMLODIPINE BESYLATE 5 MG PO TABS: 10.0000 mg | ORAL_TABLET | Freq: Every day | ORAL | Status: DC

## 2018-08-25 MED ORDER — SODIUM CHLORIDE 0.9 % IV SOLN
1000.0000 mL | Freq: Once | INTRAVENOUS | Status: AC
  Administered 2018-08-25: 1000 mL via INTRAVENOUS

## 2018-08-25 MED ORDER — CARVEDILOL 6.25 MG PO TABS
6.2500 mg | ORAL_TABLET | Freq: Two times a day (BID) | ORAL | Status: DC
  Administered 2018-08-25 – 2018-08-26 (×2): 6.25 mg via ORAL
  Filled 2018-08-25 (×2): qty 1

## 2018-08-25 MED ORDER — ACETAMINOPHEN 650 MG RE SUPP: 650.0000 mg | Freq: Four times a day (QID) | RECTAL | Status: DC | PRN

## 2018-08-25 MED ORDER — ACETAMINOPHEN 325 MG PO TABS: 650.0000 mg | ORAL_TABLET | Freq: Four times a day (QID) | ORAL | Status: DC | PRN

## 2018-08-25 MED ORDER — BACITRACIN-NEOMYCIN-POLYMYXIN 400-5-5000 EX OINT
1.0000 "application " | TOPICAL_OINTMENT | CUTANEOUS | Status: DC | PRN
  Filled 2018-08-25: qty 1

## 2018-08-25 MED ORDER — EZETIMIBE 10 MG PO TABS: 10.0000 mg | ORAL_TABLET | Freq: Every day | ORAL | Status: DC

## 2018-08-25 MED ORDER — FUROSEMIDE 40 MG PO TABS
40.0000 mg | ORAL_TABLET | Freq: Two times a day (BID) | ORAL | Status: DC
  Administered 2018-08-25 – 2018-08-26 (×2): 40 mg via ORAL
  Filled 2018-08-25 (×2): qty 1

## 2018-08-25 MED ORDER — METOLAZONE 2.5 MG PO TABS
2.5000 mg | ORAL_TABLET | Freq: Every day | ORAL | Status: DC
  Filled 2018-08-25: qty 1

## 2018-08-25 MED ORDER — RISPERIDONE 1 MG PO TABS
1.5000 mg | ORAL_TABLET | Freq: Two times a day (BID) | ORAL | Status: DC
  Administered 2018-08-25 – 2018-08-26 (×2): 1.5 mg via ORAL
  Filled 2018-08-25 (×4): qty 1.5

## 2018-08-25 MED ORDER — POTASSIUM CHLORIDE 20 MEQ PO PACK
20.0000 meq | PACK | Freq: Two times a day (BID) | ORAL | Status: DC
  Administered 2018-08-25: 20 meq via ORAL
  Filled 2018-08-25 (×2): qty 1

## 2018-08-25 MED ORDER — PIMOZIDE 1 MG PO TABS: 1.0000 mg | ORAL_TABLET | Freq: Two times a day (BID) | ORAL | Status: DC

## 2018-08-25 MED ORDER — ISOSORBIDE MONONITRATE ER 60 MG PO TB24
60.0000 mg | ORAL_TABLET | Freq: Every day | ORAL | Status: DC
  Administered 2018-08-26: 60 mg via ORAL
  Filled 2018-08-25: qty 1

## 2018-08-25 MED ORDER — MECLIZINE HCL 25 MG PO TABS
25.0000 mg | ORAL_TABLET | Freq: Two times a day (BID) | ORAL | Status: DC
  Administered 2018-08-25 – 2018-08-26 (×2): 25 mg via ORAL
  Filled 2018-08-25 (×4): qty 1

## 2018-08-25 MED ORDER — BISACODYL 5 MG PO TBEC
5.0000 mg | DELAYED_RELEASE_TABLET | Freq: Every day | ORAL | Status: DC | PRN
  Filled 2018-08-25: qty 1

## 2018-08-25 MED ORDER — INSULIN GLARGINE 100 UNIT/ML ~~LOC~~ SOLN
28.0000 [IU] | Freq: Every day | SUBCUTANEOUS | Status: DC
  Administered 2018-08-25: 28 [IU] via SUBCUTANEOUS
  Filled 2018-08-25 (×2): qty 0.28

## 2018-08-25 MED ORDER — LORAZEPAM 0.5 MG PO TABS
0.5000 mg | ORAL_TABLET | Freq: Three times a day (TID) | ORAL | Status: DC | PRN
  Administered 2018-08-26: 0.5 mg via ORAL
  Filled 2018-08-25: qty 1

## 2018-08-25 MED ORDER — ONDANSETRON HCL 4 MG/2ML IJ SOLN
4.0000 mg | Freq: Four times a day (QID) | INTRAMUSCULAR | Status: DC | PRN
  Administered 2018-08-25: 4 mg via INTRAVENOUS
  Filled 2018-08-25: qty 2

## 2018-08-25 MED ORDER — GABAPENTIN 300 MG PO CAPS
300.0000 mg | ORAL_CAPSULE | Freq: Three times a day (TID) | ORAL | Status: DC
  Administered 2018-08-25 – 2018-08-26 (×4): 300 mg via ORAL
  Filled 2018-08-25 (×4): qty 1

## 2018-08-25 MED ORDER — TRAZODONE HCL 50 MG PO TABS: 25.0000 mg | ORAL_TABLET | Freq: Every evening | ORAL | Status: DC | PRN

## 2018-08-25 MED ORDER — ASPIRIN EC 325 MG PO TBEC
325.0000 mg | DELAYED_RELEASE_TABLET | Freq: Every day | ORAL | Status: DC
  Administered 2018-08-26: 325 mg via ORAL
  Filled 2018-08-25: qty 1

## 2018-08-25 MED ORDER — METOLAZONE 2.5 MG PO TABS
2.5000 mg | ORAL_TABLET | Freq: Every day | ORAL | Status: DC
  Administered 2018-08-26: 2.5 mg via ORAL
  Filled 2018-08-25: qty 1

## 2018-08-25 MED ORDER — ALUM & MAG HYDROXIDE-SIMETH 200-200-20 MG/5ML PO SUSP
15.0000 mL | ORAL | Status: DC | PRN
  Filled 2018-08-25: qty 30

## 2018-08-25 MED ORDER — DIVALPROEX SODIUM ER 500 MG PO TB24
1000.0000 mg | ORAL_TABLET | Freq: Every day | ORAL | Status: DC
  Administered 2018-08-26: 1000 mg via ORAL
  Filled 2018-08-25: qty 2

## 2018-08-25 MED ORDER — BENZTROPINE MESYLATE 1 MG PO TABS
0.5000 mg | ORAL_TABLET | Freq: Two times a day (BID) | ORAL | Status: DC
  Administered 2018-08-25 – 2018-08-26 (×2): 0.5 mg via ORAL
  Filled 2018-08-25 (×4): qty 1

## 2018-08-25 MED ORDER — GUAIFENESIN 100 MG/5ML PO SOLN
10.0000 mL | Freq: Four times a day (QID) | ORAL | Status: DC | PRN
  Filled 2018-08-25: qty 10

## 2018-08-25 MED ORDER — INSULIN ASPART 100 UNIT/ML ~~LOC~~ SOLN
0.0000 [IU] | Freq: Every day | SUBCUTANEOUS | Status: DC
  Administered 2018-08-25: 2 [IU] via SUBCUTANEOUS
  Filled 2018-08-25: qty 1

## 2018-08-25 MED ORDER — VITAMIN D (ERGOCALCIFEROL) 1.25 MG (50000 UNIT) PO CAPS
50000.0000 [IU] | ORAL_CAPSULE | ORAL | Status: DC
  Administered 2018-08-26: 50000 [IU] via ORAL
  Filled 2018-08-25: qty 1

## 2018-08-25 MED ORDER — ONDANSETRON HCL 4 MG PO TABS: 4.0000 mg | ORAL_TABLET | Freq: Four times a day (QID) | ORAL | Status: DC | PRN

## 2018-08-25 MED ORDER — HEPARIN SODIUM (PORCINE) 5000 UNIT/ML IJ SOLN
5000.0000 [IU] | Freq: Three times a day (TID) | INTRAMUSCULAR | Status: DC
  Administered 2018-08-25 – 2018-08-26 (×3): 5000 [IU] via SUBCUTANEOUS
  Filled 2018-08-25 (×3): qty 1

## 2018-08-25 MED ORDER — ASPIRIN EC 325 MG PO TBEC: 325.0000 mg | DELAYED_RELEASE_TABLET | Freq: Every day | ORAL | Status: DC

## 2018-08-25 NOTE — ED Notes (Signed)
This RN to bedside at this time. Pt visualized attempting to get out of bed. This RN placed high fall risk sign, high fall risk bracelet, bed alarm placed, and yellow socks placed on patient. Pt instructed again to not get out of bed without assistance. Pt states, "Jason [name], what's happening to me? Am I going to die?"

## 2018-08-25 NOTE — ED Notes (Signed)
This RN back to bedside, due to patient once again attempting to get out of bed. Pt somewhat redirectable verbally however argues that he is "getting up to get something to eat". This RN explained that patient having unresponsive episodes he could not eat, pt attemtped to get out of bed again. This RN spoke with Consulting civil engineer and MD Industrial/product designer. Tommy, EDT-P at bedside at this time to sit with patient.

## 2018-08-25 NOTE — Plan of Care (Signed)

## 2018-08-25 NOTE — ED Notes (Addendum)
[facility] [phone], attempted to contact to notify of change in patient's status. No answer.

## 2018-08-25 NOTE — ED Notes (Signed)
Patient ambulatory to restroom with slow, steady gait. [facility] contacted in regards to patients discharge. Staff at facility report they will call [facility] and have them come and pick the patient up. First Nurse, Carollee Herter, notified to call Aundra Millet, RN when they arrive. Patient updated on plan of care.

## 2018-08-25 NOTE — ED Notes (Signed)
Pt c/o feeling dizzy. MD notified and to bedside to assess patient. Per Dr. Cyril Loosen, pt is to be admitted due to continued dizziness. Upon standing pt became weak and started to have tremors. Pt back to bed without incident. Pt placed back on the monitor at this time.

## 2018-08-25 NOTE — ED Notes (Addendum)
This RN at bedside immediately after completing triage, pt assisted to lay back in bed, pt had episode of unresponsiveness. Pulses palpated, pt did not respond to verbal or painful stimuli. Dr. Cyril Loosen to bedside. Episode lasted approx 5 mins, pt then jumped, looked at this RN and said "what did you do to me, what are you doing to me?" This RN explained event to patient, pt then looks at MD and requested Diet Coke, pt given diet coke with MD permission.

## 2018-08-25 NOTE — H&P (Signed)
[facility] - [place] at [facility]   PATIENT NAME: Jason [name]    MR#:  [id number]  DATE OF BIRTH:  June 12, 1951  DATE OF ADMISSION:  08/25/2018  PRIMARY CARE PHYSICIAN: Patient, No Pcp Per   REQUESTING/REFERRING PHYSICIAN: Dr. Cyril Loosen  CHIEF COMPLAINT: Fall   Chief Complaint  Patient presents with  . Fall    HISTORY OF PRESENT ILLNESS:  Jason [name]  is a 67 y.o. male with a known history of multiple medical problems of diabetes mellitus type 2, GERD, hypertension, paranoid schizophrenia, thyroid disorder who was recently discharged from our hospital on October 1st after he was admitted for near syncope had work-up including stress test, comes in with fall at group home, patient had mechanical fall here work-up showed essentially normal with normal EKG, head CT normal, patient feels a dizzy when he gets up, but nurses also for concern about possible absent attacks even though he is not disoriented after moments of unresponsiveness.  Patient able to tell me that he is feeling dizzy when he moves ,  complains of itching in the left ear and also having cough, cold.  No fever.  Because of persistent dizziness admitted to overnight observation, start on meclizine, antibiotics for acute bronchitis.  Told him that likely be discharged tomorrow morning. PAST MEDICAL HISTORY:   Past Medical History:  Diagnosis Date  . Diabetes mellitus without complication (HCC)   . GERD (gastroesophageal reflux disease)   . Hypertension   . Paranoid schizophrenia (HCC)   . Tourette disorder     PAST SURGICAL HISTOIRY:   Past Surgical History:  Procedure Laterality Date  . CARDIAC SURGERY    . HAND SURGERY    . TONSILLECTOMY      SOCIAL HISTORY:   Social History   Tobacco Use  . Smoking status: Former Games developer  . Smokeless tobacco: Never Used  Substance Use Topics  . Alcohol use: Not Currently    FAMILY HISTORY:  History reviewed. No pertinent family  history.  DRUG ALLERGIES:   Allergies  Allergen Reactions  . Keflex [Cephalexin] Rash    REVIEW OF SYSTEMS:  CONSTITUTIONAL: No fever, fatigue or weakness.  EYES: No blurred or double vision.  EARS, NOSE, AND THROAT: Has positional vertigo. RESPIRATORY: No cough, shortness of breath, wheezing or hemoptysis.  CARDIOVASCULAR: No chest pain, orthopnea, edema.  GASTROINTESTINAL: No nausea, vomiting, diarrhea or abdominal pain.  GENITOURINARY: No dysuria, hematuria.  ENDOCRINE: No polyuria, nocturia,  HEMATOLOGY: No anemia, easy bruising or bleeding SKIN: No rash or lesion. MUSCULOSKELETAL: No joint pain or arthritis.   NEUROLOGIC: No tingling, numbness, weakness.  PSYCHIATRY: No anxiety or depression.   MEDICATIONS AT HOME:   Prior to Admission medications   Medication Sig Start Date End Date Taking? Authorizing Provider  acetaminophen (TYLENOL) 500 MG tablet Take 500 mg by mouth every 4 (four) hours as needed for mild pain or fever.    Yes [provider]  alum & mag hydroxide-simeth (MINTOX REGULAR STRENGTH) 200-200-20 MG/5ML suspension Take 15-30 mLs by mouth as needed for indigestion or heartburn.   Yes [provider]  amLODipine (NORVASC) 10 MG tablet Take 10 mg by mouth daily.   Yes [provider]  amoxicillin-clavulanate (AUGMENTIN) 875-125 MG tablet Take 1 tablet by mouth 2 (two) times daily. 08/24/18 09/03/18 Yes [provider]  aspirin EC 325 MG tablet Take 325 mg by mouth daily.   Yes [provider]  atorvastatin (LIPITOR) 80 MG tablet Take 80 mg by  mouth at bedtime.    Yes [provider]  benztropine (COGENTIN) 0.5 MG tablet Take 0.5 mg by mouth 2 (two) times daily.   Yes [provider]  carvedilol (COREG) 6.25 MG tablet Take 6.25 mg by mouth 2 (two) times daily.    Yes [provider]  clopidogrel (PLAVIX) 75 MG tablet Take 75 mg by mouth daily.   Yes [provider]  divalproex  (DEPAKOTE ER) 500 MG 24 hr tablet Take 1,000 mg by mouth daily. At 1600   Yes [provider]  ezetimibe (ZETIA) 10 MG tablet Take 10 mg by mouth daily. 08/24/18  Yes [provider]  furosemide (LASIX) 40 MG tablet Take 1 tablet (40 mg total) by mouth 2 (two) times daily. 08/17/18  Yes Enid Baas, MD  gabapentin (NEURONTIN) 300 MG capsule Take 300 mg by mouth 3 (three) times daily.   Yes [provider]  glipiZIDE (GLUCOTROL) 5 MG tablet Take 5 mg by mouth daily.    Yes [provider]  guaiFENesin (ROBITUSSIN) 100 MG/5ML SOLN Take 10 mLs by mouth every 6 (six) hours as needed for cough.    Yes [provider]  insulin glargine (LANTUS) 100 UNIT/ML injection Inject 28-100 Units into the skin See admin instructions. 28 units every morning and 100 units every evening   Yes [provider]  insulin lispro (HUMALOG) 100 UNIT/ML injection Inject 57 Units into the skin 3 (three) times daily before meals.   Yes [provider]  isosorbide mononitrate (IMDUR) 60 MG 24 hr tablet Take 60 mg by mouth daily.   Yes [provider]  loperamide (IMODIUM) 2 MG capsule Take 2 mg by mouth daily as needed for diarrhea or loose stools.    Yes [provider]  LORazepam (ATIVAN) 0.5 MG tablet Take 0.5 mg by mouth 3 (three) times daily as needed for anxiety.    Yes [provider]  magnesium hydroxide (MILK OF MAGNESIA) 400 MG/5ML suspension Take 30 mLs by mouth at bedtime as needed for mild constipation.    Yes [provider]  metFORMIN (GLUCOPHAGE) 1000 MG tablet Take 1,000 mg by mouth 2 (two) times daily.    Yes [provider]  metolazone (ZAROXOLYN) 2.5 MG tablet Take 2.5 mg by mouth daily.   Yes [provider]  neomycin-bacitracin-polymyxin (NEOSPORIN) [phone] ointment Apply 1 application topically as needed (for minor cuts and abrasions).    Yes [provider]  nitroGLYCERIN  (NITROSTAT) 0.4 MG SL tablet Place 0.4 mg under the tongue every 5 (five) minutes as needed for chest pain.   Yes [provider]  Pimozide 1 MG TABS Take 1 mg by mouth 2 (two) times daily.   Yes [provider]  potassium chloride 20 MEQ TBCR Take 20 mEq by mouth 2 (two) times daily. 08/17/18  Yes Enid Baas, MD  risperiDONE (RISPERDAL) 1 MG tablet Take 1.5 mg by mouth 2 (two) times daily.    Yes [provider]  tamsulosin (FLOMAX) 0.4 MG CAPS capsule Take 0.4 mg by mouth daily.    Yes [provider]  Vitamin D, Ergocalciferol, (DRISDOL) 50000 units CAPS capsule Take 50,000 Units by mouth every 7 (seven) days.   Yes [provider]      VITAL SIGNS:  Blood pressure (!) 147/74, pulse 73, temperature 97.9 F (36.6 C), temperature source Oral, resp. rate 18, height 6' (1.829 m), weight 128.9 kg, SpO2 96 %.  PHYSICAL EXAMINATION:  GENERAL:  67 y.o.-year-old patient lying in the bed with no acute distress.  EYES: Pupils equal, round, reactive to light and accommodation. No scleral icterus. Extraocular muscles intact.  HEENT: Head atraumatic, normocephalic. Oropharynx and nasopharynx clear.  Right ear, left ear has noted but recommend congestion, right ear filled with wax. NECK:  Supple, no jugular venous distention. No thyroid enlargement, no tenderness.  LUNGS: Normal breath sounds bilaterally, no wheezing, rales,rhonchi or crepitation. No use of accessory muscles of respiration.  CARDIOVASCULAR: S1, S2 normal. No murmurs, rubs, or gallops.  ABDOMEN: Soft, nontender, nondistended. Bowel sounds present. No organomegaly or mass.  EXTREMITIES: No pedal edema, cyanosis, or clubbing.  NEUROLOGIC: Cranial nerves II through XII are intact. Muscle strength 5/5 in all extremities. Sensation intact. Gait not checked.  PSYCHIATRIC: The patient is alert and oriented x 3.  SKIN: No obvious rash, lesion, or ulcer.   LABORATORY PANEL:   CBC Recent Labs   Lab 08/25/18 0958  WBC 7.2  HGB 12.8*  HCT 38.4*  PLT 224   ------------------------------------------------------------------------------------------------------------------  Chemistries  Recent Labs  Lab 08/25/18 0958  NA 135  K 3.4*  CL 90*  CO2 32  GLUCOSE 267*  BUN 19  CREATININE 0.97  CALCIUM 9.5  AST 27  ALT 16  ALKPHOS 59  BILITOT 0.7   ------------------------------------------------------------------------------------------------------------------  Cardiac Enzymes Recent Labs  Lab 08/25/18 0958  TROPONINI <0.03   ------------------------------------------------------------------------------------------------------------------  RADIOLOGY:  Ct Head Wo Contrast  Result Date: 08/25/2018 CLINICAL DATA:  Vertigo, hearing loss.  Cholesteatoma. EXAM: CT HEAD WITHOUT CONTRAST TECHNIQUE: Contiguous axial images were obtained from the base of the skull through the vertex without intravenous contrast. COMPARISON:  None. FINDINGS: Brain: Moderate atrophy. Negative for hydrocephalus. Negative for acute infarct, hemorrhage, or mass lesion. No significant chronic ischemic changes. Vascular: Negative for hyperdense vessel Skull: Negative Sinuses/Orbits: Negative Other: None IMPRESSION: Generalized atrophy.  No acute abnormality. Electronically Signed   By: Marlan Palau M.D.   On: 08/25/2018 15:44   Dg Chest Portable 1 View  Result Date: 08/25/2018 CLINICAL DATA:  Status post unwitnessed fall. EXAM: PORTABLE CHEST 1 VIEW COMPARISON:  08/16/2018 FINDINGS: Post median sternotomy changes with intact sternal wires. Cardiomediastinal silhouette is normal for portable technique. There is no evidence of pneumothorax. Possible left lower lobe atelectasis/small pleural effusion. Osseous structures are without acute abnormality. Soft tissues are grossly normal. IMPRESSION: No evidence of pneumothorax. Possible left lower lobe atelectasis/small pleural effusion. Electronically Signed    By: Ted Mcalpine M.D.   On: 08/25/2018 10:22    EKG:   Orders placed or performed during the hospital encounter of 08/25/18  . EKG 12-Lead  . EKG 12-Lead    IMPRESSION AND PLAN:  67 year old male patient with multiple medical problems of high blood pressure,DMII, paranoid schizophrenia, recent admission for syncope had work-up for syncope including stress test which are all normal discharge back to group home comes back with fall, dizziness.  assessment and plan #1 dizziness and patient complains of dizziness more with turning the head, it looks like positional vertigo, CT head unremarkable.  Start on meclizine.  Patient already received fluids in the emergency room.  Check orthostatics.  Patient was feeling dizzy.  To get up and go to the bathroom.   2.  Acute bronchitis: Start azithromycin.  No leukocytosis or no hypoxia. 3 hypokalemia likely due to diuretics, continue to replace. 4.  CAD, CABG, patient is on Plavix, statins,  4.  History of for paranoid schizophrenia; patient is going to group home  tomorrow.  Had history of multiple ER visits, will get old records patient try to avoid staying at the group home.. All the records are reviewed and case discussed with ED provider. Management plans discussed with the patient, family and they are in agreement.  CODE STATUS: full  TOTAL TIME TAKING CARE OF THIS PATIENT: 55 minutes.    Katha Hamming M.D on 08/25/2018 at 3:56 PM  Between 7am to 6pm - Pager - [phone]  After 6pm go to [email or web address] - password [email or web address]  [facility]  Office  [phone]  CC: Primary care physician; Patient, No Pcp Per  Note: This dictation was prepared with Dragon dictation along with smaller phrase technology. Any transcriptional errors that result from this process are unintentional.

## 2018-08-25 NOTE — ED Notes (Signed)
Pt returned from CT at this time.  

## 2018-08-25 NOTE — ED Notes (Signed)
Pt given meal tray.

## 2018-08-25 NOTE — ED Notes (Signed)
THis RN called to bed by Dorian, EDT. Pt was eating sandwich when had an episode of unresponsiveness. This RN applied pressure to toenail and patient immediately woke up and was asking for the rest of his sandwich. MD notified.

## 2018-08-25 NOTE — ED Provider Notes (Addendum)
[facility] Emergency Department Provider Note   ____________________________________________    I have reviewed the triage vital signs and the nursing notes.   HISTORY  Chief Complaint Fall     HPI HASNAIN [name] is a 67 y.o. male who presents for reported fall.  Apparently this was an unwitnessed fall.  The patient denies any complaints.  Does have a history of diabetes, schizophrenia and Tourette's as detailed below.  Review of medical records demonstrates frequent visits to the emergency department because he does not like his group home.  Patient states he feels well and is requesting Diet Coke and something to eat.  No chest pain or palpitations.  No headache or neck pain or back pain or extremity injuries  Past Medical History:  Diagnosis Date  . Diabetes mellitus without complication (HCC)   . GERD (gastroesophageal reflux disease)   . Hypertension   . Paranoid schizophrenia (HCC)   . Tourette disorder     Patient Active Problem List   Diagnosis Date Noted  . Hypokalemia due to inadequate potassium intake 08/16/2018  . Hypertension 08/07/2018  . Malingering 08/07/2018  . Intellectual disability 08/07/2018  . Paranoid schizophrenia (HCC) 07/23/2018  . Diabetes mellitus without complication (HCC) 07/23/2018  . Tourette disorder 07/23/2018    Past Surgical History:  Procedure Laterality Date  . CARDIAC SURGERY    . HAND SURGERY    . TONSILLECTOMY      Prior to Admission medications   Medication Sig Start Date End Date Taking? Authorizing Provider  acetaminophen (TYLENOL) 500 MG tablet Take 500 mg by mouth every 4 (four) hours as needed for mild pain or fever.     [provider]  alum & mag hydroxide-simeth (MINTOX REGULAR STRENGTH) 200-200-20 MG/5ML suspension Take 15-30 mLs by mouth as needed for indigestion or heartburn.    [provider]  amLODipine (NORVASC) 10 MG tablet Take 10 mg by mouth daily.     [provider]  aspirin 325 MG tablet Take 325 mg by mouth daily.    [provider]  atorvastatin (LIPITOR) 80 MG tablet Take 80 mg by mouth daily.    [provider]  benztropine (COGENTIN) 0.5 MG tablet Take 0.5 mg by mouth 2 (two) times daily.    [provider]  carvedilol (COREG) 6.25 MG tablet Take 6.25 mg by mouth 2 (two) times daily with a meal.    [provider]  ciprofloxacin (CIPRO) 500 MG tablet Take 500 mg by mouth 2 (two) times daily. 08/12/18 08/27/18  [provider]  clopidogrel (PLAVIX) 75 MG tablet Take 75 mg by mouth daily.    [provider]  DIALYVITE VITAMIN D3 MAX 16109 units TABS Take 50,000 Units by mouth every 7 (seven) days. 07/24/18   [provider]  divalproex (DEPAKOTE ER) 500 MG 24 hr tablet Take 1,000 mg by mouth daily.    [provider]  furosemide (LASIX) 40 MG tablet Take 1 tablet (40 mg total) by mouth 2 (two) times daily. 08/17/18   Enid Baas, MD  gabapentin (NEURONTIN) 300 MG capsule Take 300 mg by mouth 3 (three) times daily.    [provider]  glipiZIDE (GLUCOTROL) 5 MG tablet Take 5 mg by mouth daily before breakfast.    [provider]  guaiFENesin (ROBITUSSIN) 100 MG/5ML SOLN Take 10 mLs by mouth every 6 (six) hours as needed for cough or to loosen phlegm.     [provider]  insulin glargine (LANTUS) 100 UNIT/ML injection Inject 100 Units into the skin at bedtime.    [provider]  insulin glargine (LANTUS) 100 UNIT/ML injection Inject 28 Units into the skin daily.    [provider]  insulin lispro (HUMALOG) 100 UNIT/ML injection Inject 57 Units into the skin 3 (three) times daily before meals.    [provider]  isosorbide mononitrate (IMDUR) 60 MG 24 hr tablet Take 60 mg by mouth daily.    [provider]  loperamide (IMODIUM) 2 MG capsule Take 2 mg by mouth as needed for diarrhea or loose stools  (max 4 doses in 24 hours).     [provider]  LORazepam (ATIVAN) 0.5 MG tablet Take 0.5 mg by mouth every 6 (six) hours as needed for anxiety (agitation).    [provider]  magnesium hydroxide (MILK OF MAGNESIA) 400 MG/5ML suspension Take 30 mLs by mouth daily as needed for mild constipation.    [provider]  metFORMIN (GLUCOPHAGE) 1000 MG tablet Take 1,000 mg by mouth 2 (two) times daily with a meal.    [provider]  metolazone (ZAROXOLYN) 2.5 MG tablet Take 2.5 mg by mouth daily.    [provider]  neomycin-bacitracin-polymyxin (NEOSPORIN) [phone] ointment Apply 1 application topically as needed.    [provider]  nitroGLYCERIN (NITROSTAT) 0.4 MG SL tablet Place 0.4 mg under the tongue every 5 (five) minutes as needed for chest pain.    [provider]  Pimozide 1 MG TABS Take 1 mg by mouth 2 (two) times daily.    [provider]  potassium chloride 20 MEQ TBCR Take 20 mEq by mouth 2 (two) times daily. 08/17/18   Enid Baas, MD  risperiDONE (RISPERDAL) 1 MG tablet Take 1.5 mg by mouth 2 (two) times daily.     [provider]  tamsulosin (FLOMAX) 0.4 MG CAPS capsule Take 0.4 mg by mouth daily after breakfast.    [provider]     Allergies Keflex [cephalexin]  History reviewed. No pertinent family history.  Social History Social History   Tobacco Use  . Smoking status: Former Games developer  . Smokeless tobacco: Never Used  Substance Use Topics  . Alcohol use: Not Currently  . Drug use: Not Currently    Review of Systems  Constitutional: No dizziness Eyes: No visual changes.  ENT: No neck pain Cardiovascular: Denies chest pain. Respiratory: Denies shortness of breath. Gastrointestinal: No abdominal pain.   Genitourinary: Negative for dysuria. Musculoskeletal: Negative for back pain. Skin: Negative for rash. Neurological: Negative for headaches     ____________________________________________   PHYSICAL EXAM:  VITAL SIGNS: ED Triage Vitals  Enc Vitals Group     BP 08/25/18 0952 (!) 145/89     Pulse Rate 08/25/18 0952 79     Resp 08/25/18 0952 20     Temp 08/25/18 0952 97.9 F (36.6 C)     Temp Source 08/25/18 0952 Oral     SpO2 08/25/18 0952 96 %     Weight 08/25/18 0950 128.9 kg (284 lb 1.6 oz)     Height 08/25/18 0950 1.829 m (6')     Head Circumference --      Peak Flow --      Pain Score 08/25/18 0953 0     Pain Loc --      Pain Edu? --      Excl. in GC? --     Constitutional: Alert and oriented. No acute distress.  Eyes: Conjunctivae are normal.  Head: Atraumatic. Nose: No congestion/rhinnorhea. Mouth/Throat: Mucous membranes are moist.   Neck:  Painless ROM, no vertebral tenderness to palpation Cardiovascular: Normal rate, regular rhythm. Grossly normal heart sounds.  Good peripheral circulation.  Chest wall tenderness palpation Respiratory: Normal respiratory effort.  No retractions. Lungs CTAB. Gastrointestinal: Soft and nontender. No distention.   Musculoskeletal:   Warm and well perfused with full range of motion of all extremity's Neurologic:  Normal speech and language. No gross focal neurologic deficits are appreciated.  Skin:  Skin is warm, dry and intact. Psychiatric: Mood and affect are normal. Speech and behavior are normal.  ____________________________________________   LABS (all labs ordered are listed, but only abnormal results are displayed)  Labs Reviewed  CBC - Abnormal; Notable for the following components:      Result Value   Hemoglobin 12.8 (*)    HCT 38.4 (*)    All other components within normal limits  COMPREHENSIVE METABOLIC PANEL - Abnormal; Notable for the following components:   Potassium 3.4 (*)    Chloride 90 (*)    Glucose, Bld 267 (*)    All other components within normal limits  URINALYSIS, COMPLETE (UACMP) WITH MICROSCOPIC - Abnormal; Notable for the following  components:   Color, Urine STRAW (*)    APPearance CLEAR (*)    Glucose, UA >=500 (*)    Hgb urine dipstick SMALL (*)    All other components within normal limits  TROPONIN I   ____________________________________________  EKG  ED ECG REPORT I, Jene Every, the attending physician, personally viewed and interpreted this ECG.  Date: 08/25/2018  Rhythm: Likely junctional rhythm QRS Axis: normal Intervals: Abnormal ST/T Wave abnormalities: Specific changes Narrative Interpretation: Unchanged from prior EKG  ____________________________________________  RADIOLOGY  Chest x-ray unremarkable ____________________________________________   PROCEDURES  Procedure(s) performed: No  Procedures   Critical Care performed: No ____________________________________________   INITIAL IMPRESSION / ASSESSMENT AND PLAN / ED COURSE  Pertinent labs & imaging results that were available during my care of the patient were reviewed by me and considered in my medical decision making (see chart for details).  Overall well-appearing in no acute distress.  He has periods where he seems to go unresponsive however no change in heart rate or blood pressure and quickly responds to painful stimuli.  No postictal period.  Does not appear to be seizure activity, question whether this may be related to his Tourette's.  Lab work is unremarkable, vitals normal, no change on cardiac monitor.  Patient continues to complain of dizziness, very unsteady on his feet.  Have discussed with hospitalist for admission    ____________________________________________   FINAL CLINICAL IMPRESSION(S) / ED DIAGNOSES  Final diagnoses:  Fall, initial encounter  Syncope and collapse        Note:  This document was prepared using Dragon voice recognition software and may include unintentional dictation errors.    Jene Every, MD 08/25/18 1404    Jene Every, MD 08/25/18 1430

## 2018-08-25 NOTE — ED Notes (Signed)
Pt continues to have 1:1 safety sitter at bedside. Pt requesting the rest of his sandwich. This RN explained that due to patient having an event he could not have any more food at this time for safety concerns. Pt noted to be angry, however remains cooperative at this time. Will continue to monitor for further patient needs.

## 2018-08-25 NOTE — ED Triage Notes (Signed)
Pt presents to ED via [facility] with c/o fall. Per EMS pt presents from [facility] on [address] EMS reports that patient c/o being cold x 1 week, got up this morning to go to the bathoom and got hot. EMS reports staff found patient in the floor, pt does not remember the event in full, however reports that "something ran across his feet". EMS also reporst patient has possible UTI x 1 week.

## 2018-08-25 NOTE — ED Notes (Signed)
Pt currently sitting in chair beside bed at this time. Pt c/o continued feeling dizzy. Awaiting [facility] Adas to take patient back to [facility] as instructed by staff there.

## 2018-08-25 NOTE — ED Notes (Signed)
This RN contacted [facility] [phone], staff at facility state that [facility] gave them an ETA of approx and they called "15 minutes ago".

## 2018-08-25 NOTE — ED Notes (Addendum)
This RN attempted to contact [employer], [phone]. Left HIPPA compliant voicemail.

## 2018-08-25 NOTE — ED Notes (Signed)
This RN back to bedside to check on patient. Pt found to be shaking slightly in the bed, eyes open, and unresponsive to verbal stimuli, shaking, and responds to painful stimuli. MD made aware. Pt states " I don't feel good, I feel weak and dizzy, am I going to die?".

## 2018-08-25 NOTE — ED Notes (Signed)
While pt eating, pts eyes closed and head fell back. Pts arms both fell to the side. Pt could not be aroused until toe nail was pressed on by Aundra Millet, Charity fundraiser. Pt then became alert and was instructed to swallow the food in his mouth. Pts food tray was taken in order to ensure pts safety.

## 2018-08-25 NOTE — Discharge Instructions (Addendum)
Patient had reassuring workup in the ED

## 2018-08-25 NOTE — Consult Note (Signed)
[facility] Consult   Reason for Consult: Consult for this 67 year old man with schizophrenia who came to the hospital with complaints of dizziness and falls Referring Physician: Corky Downs Patient Identification: Jason [name] MRN:  [id number] Principal Diagnosis: Paranoid schizophrenia [facility]) Diagnosis:   Patient Active Problem List   Diagnosis Date Noted  . Dizziness [R42] 08/25/2018  . Hypokalemia due to inadequate potassium intake [E87.6] 08/16/2018  . Hypertension [I10] 08/07/2018  . Malingering [Z76.5] 08/07/2018  . Intellectual disability [F79] 08/07/2018  . Paranoid schizophrenia ([place]) [F20.0] 07/23/2018  . Diabetes mellitus without complication ([place]) [W41.3] 07/23/2018  . Tourette disorder [F95.2] 07/23/2018    Total Time spent with patient: 1 hour  Subjective:   Jason [name] is a 67 y.o. male patient admitted with "I am not feeling so good".  HPI: Patient seen chart reviewed.  Patient known from previous encounters.  Patient reports that he blacked out today and fell down.  Additionally he complained that when he got up to walk to the bathroom here in the emergency room he also fell down.  He is feeling dizzy even laying perfectly still on a stretcher.  Also says that everything around him looks like it is waving back and forth.  Says this is been going on for days.  Patient says he has been taking all of his medicines as regular nothing new.  Denies alcohol or drug abuse.  Patient admits to still having hallucinations at times.  Denies current suicidal intent or plan.  He admits that he still feels bad about his group home and dislikes it but is not complaining about it as bitterly as he was last time I saw him.  Medical history: Patient has diabetes which is very poorly controlled.  He tells me that his sugars usually run in the 3-400 range.  Has high blood pressure which is only partially controlled right now.  Substance abuse history: No history of  alcohol or drug abuse  Social history: Is in a group home.  Has a guardian.  Dislikes the group home and has been through many of them because of his behavior  Past Psychiatric History: Long history of schizophrenia also intellectual disability.  Multiple hospitalizations multiple emergency room visits.  Identified history of malingering in order to try to get his way and get out of the group home where he is living.  Currently on mood stabilizers and antipsychotics.  Risk to Self:   Risk to Others:   Prior Inpatient Therapy:   Prior Outpatient Therapy:    Past Medical History:  Past Medical History:  Diagnosis Date  . Diabetes mellitus without complication ([place])   . GERD (gastroesophageal reflux disease)   . Hypertension   . Paranoid schizophrenia ([place])   . Tourette disorder     Past Surgical History:  Procedure Laterality Date  . CARDIAC SURGERY    . HAND SURGERY    . TONSILLECTOMY     Family History: History reviewed. No pertinent family history. Family Psychiatric  History: None known Social History:  Social History   Substance and Sexual Activity  Alcohol Use Not Currently     Social History   Substance and Sexual Activity  Drug Use Not Currently    Social History   Socioeconomic History  . Marital status: Single    Spouse name: Not on file  . Number of children: Not on file  . Years of education: Not on file  . Highest education level: Not on file  Occupational  History  . Not on file  Social Needs  . Financial resource strain: Not on file  . Food insecurity:    Worry: Not on file    Inability: Not on file  . Transportation needs:    Medical: Not on file    Non-medical: Not on file  Tobacco Use  . Smoking status: Former Research scientist (life sciences)  . Smokeless tobacco: Never Used  Substance and Sexual Activity  . Alcohol use: Not Currently  . Drug use: Not Currently  . Sexual activity: Not Currently  Lifestyle  . Physical activity:    Days per week: Not on file     Minutes per session: Not on file  . Stress: Not on file  Relationships  . Social connections:    Talks on phone: Never    Gets together: Never    Attends religious service: Never    Active member of club or organization: No    Attends meetings of clubs or organizations: Never    Relationship status: Never married  Other Topics Concern  . Not on file  Social History Narrative  . Not on file   Additional Social History:    Allergies:   Allergies  Allergen Reactions  . Keflex [Cephalexin] Rash    Labs:  Results for orders placed or performed during the hospital encounter of 08/25/18 (from the past 48 hour(s))  CBC     Status: Abnormal   Collection Time: 08/25/18  9:58 AM  Result Value Ref Range   WBC 7.2 4.0 - 10.5 K/uL   RBC 4.63 4.22 - 5.81 MIL/uL   Hemoglobin 12.8 (L) 13.0 - 17.0 g/dL   HCT 38.4 (L) 39.0 - 52.0 %   MCV 82.9 80.0 - 100.0 fL   MCH 27.6 26.0 - 34.0 pg   MCHC 33.3 30.0 - 36.0 g/dL   RDW 15.1 11.5 - 15.5 %   Platelets 224 150 - 400 K/uL   nRBC 0.0 0.0 - 0.2 %    Comment: Performed at [facility], [address] [place], [place] [address]  Comprehensive metabolic panel     Status: Abnormal   Collection Time: 08/25/18  9:58 AM  Result Value Ref Range   Sodium 135 135 - 145 mmol/L   Potassium 3.4 (L) 3.5 - 5.1 mmol/L    Comment: HEMOLYSIS AT THIS LEVEL MAY AFFECT RESULT   Chloride 90 (L) 98 - 111 mmol/L   CO2 32 22 - 32 mmol/L   Glucose, Bld 267 (H) 70 - 99 mg/dL   BUN 19 8 - 23 mg/dL   Creatinine, Ser 0.97 0.61 - 1.24 mg/dL   Calcium 9.5 8.9 - 10.3 mg/dL   Total Protein 7.5 6.5 - 8.1 g/dL   Albumin 3.6 3.5 - 5.0 g/dL   AST 27 15 - 41 U/L   ALT 16 0 - 44 U/L   Alkaline Phosphatase 59 38 - 126 U/L   Total Bilirubin 0.7 0.3 - 1.2 mg/dL   GFR calc non Af Amer >60 >60 mL/min   GFR calc Af Amer >60 >60 mL/min    Comment: (NOTE) The eGFR has been calculated using the CKD EPI equation. This calculation has not been validated in all clinical  situations. eGFR's persistently <60 mL/min signify possible Chronic Kidney Disease.    Anion gap 13 5 - 15    Comment: Performed at [facility], [address] [place], [place] [address]  Urinalysis, Complete w Microscopic     Status: Abnormal  Collection Time: 08/25/18  9:58 AM  Result Value Ref Range   Color, Urine STRAW (A) YELLOW   APPearance CLEAR (A) CLEAR   Specific Gravity, Urine 1.006 1.005 - 1.030   pH 7.0 5.0 - 8.0   Glucose, UA >=500 (A) NEGATIVE mg/dL   Hgb urine dipstick SMALL (A) NEGATIVE   Bilirubin Urine NEGATIVE NEGATIVE   Ketones, ur NEGATIVE NEGATIVE mg/dL   Protein, ur NEGATIVE NEGATIVE mg/dL   Nitrite NEGATIVE NEGATIVE   Leukocytes, UA NEGATIVE NEGATIVE   RBC / HPF 0-5 0 - 5 RBC/hpf   WBC, UA 0-5 0 - 5 WBC/hpf   Bacteria, UA NONE SEEN NONE SEEN   Squamous Epithelial / LPF NONE SEEN 0 - 5    Comment: Performed at [facility], [address] [place], [place] [address]  Troponin I     Status: None   Collection Time: 08/25/18  9:58 AM  Result Value Ref Range   Troponin I <0.03 <0.03 ng/mL    Comment: Performed at [facility], [address] [place], [place] [address]    Current Facility-Administered Medications  Medication Dose Route Frequency Provider Last Rate Last Dose  . acetaminophen (TYLENOL) tablet 650 mg  650 mg Oral Q6H PRN Epifanio Lesches, MD       Or  . acetaminophen (TYLENOL) suppository 650 mg  650 mg Rectal Q6H PRN Epifanio Lesches, MD      . alum & mag hydroxide-simeth (MAALOX/MYLANTA) 200-200-20 MG/5ML suspension 15-30 mL  15-30 mL Oral PRN Epifanio Lesches, MD      . Derrill Memo ON 08/26/2018] amLODipine (NORVASC) tablet 10 mg  10 mg Oral Daily Epifanio Lesches, MD      . Derrill Memo ON 08/26/2018] aspirin EC tablet 325 mg  325 mg Oral Daily Epifanio Lesches, MD      . benztropine (COGENTIN) tablet 0.5 mg  0.5 mg Oral BID Epifanio Lesches, MD      . bisacodyl (DULCOLAX) EC tablet 5 mg  5 mg  Oral Daily PRN Epifanio Lesches, MD      . carvedilol (COREG) tablet 6.25 mg  6.25 mg Oral BID Epifanio Lesches, MD      . Derrill Memo ON 08/26/2018] Chlorhexidine Gluconate Cloth 2 % PADS 6 each  6 each Topical Q0600 Epifanio Lesches, MD      . Derrill Memo ON 08/26/2018] divalproex (DEPAKOTE ER) 24 hr tablet 1,000 mg  1,000 mg Oral Daily Epifanio Lesches, MD      . docusate sodium (COLACE) capsule 100 mg  100 mg Oral BID Epifanio Lesches, MD      . Derrill Memo ON 08/26/2018] ezetimibe (ZETIA) tablet 10 mg  10 mg Oral Daily Epifanio Lesches, MD      . furosemide (LASIX) tablet 40 mg  40 mg Oral BID Epifanio Lesches, MD      . gabapentin (NEURONTIN) capsule 300 mg  300 mg Oral TID Epifanio Lesches, MD      . Derrill Memo ON 08/26/2018] glipiZIDE (GLUCOTROL) tablet 5 mg  5 mg Oral Daily Epifanio Lesches, MD      . guaiFENesin (ROBITUSSIN) 100 MG/5ML solution 200 mg  10 mL Oral Q6H PRN Epifanio Lesches, MD      . heparin injection 5,000 Units  5,000 Units Subcutaneous Q8H Epifanio Lesches, MD      . HYDROcodone-acetaminophen (NORCO/VICODIN) 5-325 MG per tablet 1-2 tablet  1-2 tablet Oral Q4H PRN Epifanio Lesches, MD      . insulin aspart (novoLOG) injection 0-15 Units  0-15 Units Subcutaneous  TID WC Epifanio Lesches, MD      . insulin aspart (novoLOG) injection 0-5 Units  0-5 Units Subcutaneous QHS Epifanio Lesches, MD      . insulin aspart (novoLOG) injection 10 Units  10 Units Subcutaneous TID WC Epifanio Lesches, MD      . insulin glargine (LANTUS) injection 28 Units  28 Units Subcutaneous QHS Epifanio Lesches, MD      . Derrill Memo ON 08/26/2018] isosorbide mononitrate (IMDUR) 24 hr tablet 60 mg  60 mg Oral Daily Epifanio Lesches, MD      . levofloxacin (LEVAQUIN) tablet 500 mg  500 mg Oral Daily Epifanio Lesches, MD      . LORazepam (ATIVAN) tablet 0.5 mg  0.5 mg Oral TID PRN Epifanio Lesches, MD      . meclizine (ANTIVERT) tablet 25 mg  25 mg Oral  BID Epifanio Lesches, MD      . metFORMIN (GLUCOPHAGE) tablet 1,000 mg  1,000 mg Oral BID WC Epifanio Lesches, MD      . Derrill Memo ON 08/26/2018] metolazone (ZAROXOLYN) tablet 2.5 mg  2.5 mg Oral Daily Epifanio Lesches, MD      . mupirocin ointment (BACTROBAN) 2 % 1 application  1 application Nasal BID Epifanio Lesches, MD      . neomycin-bacitracin-polymyxin (NEOSPORIN) ointment 1 application  1 application Topical PRN Epifanio Lesches, MD      . ondansetron (ZOFRAN) tablet 4 mg  4 mg Oral Q6H PRN Epifanio Lesches, MD       Or  . ondansetron (ZOFRAN) injection 4 mg  4 mg Intravenous Q6H PRN Epifanio Lesches, MD      . Pimozide TABS 1 mg  1 mg Oral BID Epifanio Lesches, MD      . potassium chloride (KLOR-CON) packet 20 mEq  20 mEq Oral BID Epifanio Lesches, MD      . risperiDONE (RISPERDAL) tablet 1.5 mg  1.5 mg Oral BID Epifanio Lesches, MD      . traZODone (DESYREL) tablet 25 mg  25 mg Oral QHS PRN Epifanio Lesches, MD      . Derrill Memo ON 08/26/2018] Vitamin D (Ergocalciferol) (DRISDOL) capsule 50,000 Units  50,000 Units Oral Q7 days Epifanio Lesches, MD        Musculoskeletal: Strength & Muscle Tone: within normal limits Gait & Station: normal Patient leans: N/A  Psychiatric Specialty Exam: Physical Exam  Nursing note and vitals reviewed. Constitutional: He appears well-developed and well-nourished.  HENT:  Head: Normocephalic and atraumatic.  Eyes: Pupils are equal, round, and reactive to light. Conjunctivae are normal.  Neck: Normal range of motion.  Cardiovascular: Regular rhythm and normal heart sounds.  Respiratory: Effort normal. No respiratory distress.  GI: Soft.  Musculoskeletal: Normal range of motion.  Neurological: He is alert.  Skin: Skin is warm and dry.  Psychiatric: His speech is normal. Judgment normal. His affect is blunt. He is not agitated and not aggressive. Thought content is not paranoid. Cognition and memory are  impaired. He expresses no homicidal and no suicidal ideation.    Review of Systems  Constitutional: Negative.   HENT: Negative.   Eyes: Negative.   Respiratory: Negative.   Cardiovascular: Negative.   Gastrointestinal: Negative.   Musculoskeletal: Positive for falls.  Skin: Negative.   Neurological: Positive for dizziness.  Psychiatric/Behavioral: Negative for depression, hallucinations, memory loss, substance abuse and suicidal ideas. The patient is nervous/anxious and has insomnia.     Blood pressure (!) 152/82, pulse 72, temperature 97.8 F (36.6 C), temperature source Oral, resp.  rate 18, height 6' (1.829 m), weight 128.9 kg, SpO2 98 %.Body mass index is 38.53 kg/m.  General Appearance: Casual  Eye Contact:  Good  Speech:  Normal Rate  Volume:  Normal  Mood:  Dysphoric  Affect:  Constricted  Thought Process:  Goal Directed  Orientation:  Full (Time, Place, and Person)  Thought Content:  Logical  Suicidal Thoughts:  No  Homicidal Thoughts:  No  Memory:  Immediate;   Fair Recent;   Fair Remote;   Fair  Judgement:  Impaired  Insight:  Shallow  Psychomotor Activity:  Decreased  Concentration:  Concentration: Fair  Recall:  [employer] of Knowledge:  Fair  Language:  Fair  Akathisia:  No  Handed:  Right  AIMS (if indicated):     Assets:  Desire for Improvement Housing  ADL's:  Intact  Cognition:  Impaired,  Mild  Sleep:        Treatment Plan Summary: Daily contact with patient to assess and evaluate symptoms and progress in treatment, Medication management and Plan Patient is being admitted to the medical service because of his complaints of falls.  So far no specific cause identified.  Blood pressure is stable.  CT scan negative.  Patient's medications are the same as they have been for quite a while.  There was no Depakote level ordered so I have gone ahead and order that as well as an ammonia as part of the workup.  I would definitely not rule out that the patient  may be malingering all of this as a way to try to get out of his group home again.  He has done that before and these falls from what I understand have so far been unwitnessed and not caused any serious injury.  In any case I will not change his psychiatric medicine for now but will follow up while he is in the hospital.  Disposition: No evidence of imminent risk to self or others at present.   Patient does not meet criteria for psychiatric inpatient admission. Supportive therapy provided about ongoing stressors.  Alethia Berthold, MD 08/25/2018 4:18 PM

## 2018-08-26 DIAGNOSIS — F200 Paranoid schizophrenia: Secondary | ICD-10-CM | POA: Diagnosis not present

## 2018-08-26 DIAGNOSIS — R42 Dizziness and giddiness: Secondary | ICD-10-CM | POA: Diagnosis not present

## 2018-08-26 LAB — BASIC METABOLIC PANEL
Anion gap: 14 (ref 5–15)
BUN: 22 mg/dL (ref 8–23)
CALCIUM: 8.6 mg/dL — AB (ref 8.9–10.3)
CHLORIDE: 91 mmol/L — AB (ref 98–111)
CO2: 30 mmol/L (ref 22–32)
CREATININE: 1.04 mg/dL (ref 0.61–1.24)
GFR calc Af Amer: >60 mL/min (ref >60)
Glucose, Bld: 260 mg/dL — ABNORMAL HIGH (ref 70–99)
Potassium: 3.2 mmol/L — ABNORMAL LOW (ref 3.5–5.1)
SODIUM: 135 mmol/L (ref 135–145)

## 2018-08-26 LAB — GLUCOSE, CAPILLARY
Glucose-Capillary: 233 mg/dL — ABNORMAL HIGH (ref 70–99)
Glucose-Capillary: 204 mg/dL — ABNORMAL HIGH (ref 70–99)
Glucose-Capillary: 219 mg/dL — ABNORMAL HIGH (ref 70–99)

## 2018-08-26 LAB — MAGNESIUM: Magnesium: 1.3 mg/dL — ABNORMAL LOW (ref 1.7–2.4)

## 2018-08-26 LAB — CBC
HEMATOCRIT: 33.5 % — AB (ref 39.0–52.0)
Hemoglobin: 11.1 g/dL — ABNORMAL LOW (ref 13.0–17.0)
MCH: 27.7 pg (ref 26.0–34.0)
MCHC: 33.1 g/dL (ref 30.0–36.0)
MCV: 83.5 fL (ref 80.0–100.0)
Platelets: 201 10*3/uL (ref 150–400)
RBC: 4.01 MIL/uL — ABNORMAL LOW (ref 4.22–5.81)
RDW: 14.9 % (ref 11.5–15.5)
WBC: 7.3 10*3/uL (ref 4.0–10.5)
nRBC: 0 % (ref 0.0–0.2)

## 2018-08-26 MED ORDER — HALOPERIDOL LACTATE 5 MG/ML IJ SOLN
1.0000 mg | Freq: Four times a day (QID) | INTRAMUSCULAR | Status: DC | PRN
  Administered 2018-08-26: 1 mg via INTRAVENOUS
  Filled 2018-08-26: qty 1

## 2018-08-26 MED ORDER — POTASSIUM CHLORIDE CRYS ER 20 MEQ PO TBCR
40.0000 meq | EXTENDED_RELEASE_TABLET | Freq: Once | ORAL | Status: DC
  Filled 2018-08-26: qty 2

## 2018-08-26 MED ORDER — MAGNESIUM SULFATE 4 GM/100ML IV SOLN
4.0000 g | Freq: Once | INTRAVENOUS | Status: AC
  Administered 2018-08-26: 4 g via INTRAVENOUS
  Filled 2018-08-26: qty 100

## 2018-08-26 MED ORDER — MECLIZINE HCL 25 MG PO TABS: 25.0000 mg | ORAL_TABLET | Freq: Two times a day (BID) | ORAL | 0 refills | Status: DC

## 2018-08-26 NOTE — Clinical Social Work Note (Signed)
Patient to be d/c'ed today to family care home.  Patient and family agreeable to plans will transport via cab transportation.  Windell Moulding, MSW, [facility] [phone]

## 2018-08-26 NOTE — Care Management Obs Status (Signed)
MEDICARE OBSERVATION STATUS NOTIFICATION   Patient Details  Name: Jason [name] MRN: [id number] Date of Birth: 11-24-1950   Medicare Observation Status Notification Given:  (S) Yes(Left message with legal guardian, Zipporah Plants.  Zerita Boers, RN)    Sherren Kerns, RN 08/26/2018, 4:33 PM

## 2018-08-26 NOTE — Progress Notes (Signed)
Called and gave medical report to individual at receiving group home. All questions answered. Patient to transport by cab momentarily. Will continue to monitor. Jason [name] [facility]

## 2018-08-26 NOTE — Progress Notes (Signed)
Patient having episodes where he reports seeing a green monster with red eyes, screaming randomly at monster, threw a chair at the monster. Patient also walked out into hall and said he was going to the road so he could get hit by a car and kill his self. Sitter was placed at bedside for precautions, MD paged and notified. IV haldol ordered and given by primary RN.

## 2018-08-26 NOTE — Progress Notes (Signed)
Patient currently resting in bed. Will continue to monitor patient to end of shift.

## 2018-08-26 NOTE — Discharge Summary (Signed)
[facility] [place] [facility]   PATIENT NAME: Jason [name]    MR#:  [id number]  DATE OF BIRTH:  09/18/1951  DATE OF ADMISSION:  08/25/2018   ADMITTING PHYSICIAN: Katha Hamming, MD  DATE OF DISCHARGE: 08/26/2018  PRIMARY CARE PHYSICIAN: Patient, No Pcp Per   ADMISSION DIAGNOSIS:  Syncope and collapse [R55] Fall, initial encounter [W19.XXXA] DISCHARGE DIAGNOSIS:  Principal Problem:   Paranoid schizophrenia (HCC) Active Problems:   Diabetes mellitus without complication (HCC)   Hypertension   Dizziness  SECONDARY DIAGNOSIS:   Past Medical History:  Diagnosis Date  . Diabetes mellitus without complication (HCC)   . GERD (gastroesophageal reflux disease)   . Hypertension   . Paranoid schizophrenia (HCC)   . Tourette disorder    HOSPITAL COURSE:  67 year old male patient with multiple medical problems of high blood pressure,DMII, paranoid schizophrenia, recent admission for syncope had work-up for syncope including stress test which are all normal discharge back to group home comes back with fall, dizziness.  assessment and plan #1 dizziness and patient complains of dizziness more with turning the head, it looks like positional vertigo, CT head unremarkable.  Started meclizine.  Patient already received fluids in the emergency room.  2.  Acute bronchitis. Given abx. No leukocytosis or no hypoxia. 3 hypokalemia likely due to diuretics, replaced. Hypomagnesemia. Given IV mag. Follow mag and K with PCP. 4.  CAD, CABG, patient is on Plavix, statins,  4.  History of for paranoid schizophrenia;  Hallucination and agitation this am.  Per Dr. Blair Hailey, no change of meds, patient can be discharged to group home  I discussed with Dr. Toni Amend this am. DISCHARGE CONDITIONS:  Stable, discharge to group home today. CONSULTS OBTAINED:  Treatment Team:  Clapacs, Jackquline Denmark, MD DRUG ALLERGIES:   Allergies  Allergen Reactions  . Keflex [Cephalexin] Rash    DISCHARGE MEDICATIONS:   Allergies as of 08/26/2018      Reactions   Keflex [cephalexin] Rash      Medication List    TAKE these medications   acetaminophen 500 MG tablet Commonly known as:  TYLENOL Take 500 mg by mouth every 4 (four) hours as needed for mild pain or fever.   amLODipine 10 MG tablet Commonly known as:  NORVASC Take 10 mg by mouth daily.   amoxicillin-clavulanate 875-125 MG tablet Commonly known as:  AUGMENTIN Take 1 tablet by mouth 2 (two) times daily.   aspirin EC 325 MG tablet Take 325 mg by mouth daily.   atorvastatin 80 MG tablet Commonly known as:  LIPITOR Take 80 mg by mouth at bedtime.   benztropine 0.5 MG tablet Commonly known as:  COGENTIN Take 0.5 mg by mouth 2 (two) times daily.   carvedilol 6.25 MG tablet Commonly known as:  COREG Take 6.25 mg by mouth 2 (two) times daily.   clopidogrel 75 MG tablet Commonly known as:  PLAVIX Take 75 mg by mouth daily.   divalproex 500 MG 24 hr tablet Commonly known as:  DEPAKOTE ER Take 1,000 mg by mouth daily. At 1600   ezetimibe 10 MG tablet Commonly known as:  ZETIA Take 10 mg by mouth daily.   furosemide 40 MG tablet Commonly known as:  LASIX Take 1 tablet (40 mg total) by mouth 2 (two) times daily.   gabapentin 300 MG capsule Commonly known as:  NEURONTIN Take 300 mg by mouth 3 (three) times daily.   glipiZIDE 5 MG tablet Commonly known as:  GLUCOTROL Take 5  mg by mouth daily.   guaiFENesin 100 MG/5ML Soln Commonly known as:  ROBITUSSIN Take 10 mLs by mouth every 6 (six) hours as needed for cough.   insulin glargine 100 UNIT/ML injection Commonly known as:  LANTUS Inject 28-100 Units into the skin See admin instructions. 28 units every morning and 100 units every evening   insulin lispro 100 UNIT/ML injection Commonly known as:  HUMALOG Inject 57 Units into the skin 3 (three) times daily before meals.   isosorbide mononitrate 60 MG 24 hr tablet Commonly known as:   IMDUR Take 60 mg by mouth daily.   loperamide 2 MG capsule Commonly known as:  IMODIUM Take 2 mg by mouth daily as needed for diarrhea or loose stools.   LORazepam 0.5 MG tablet Commonly known as:  ATIVAN Take 0.5 mg by mouth 3 (three) times daily as needed for anxiety.   magnesium hydroxide 400 MG/5ML suspension Commonly known as:  MILK OF MAGNESIA Take 30 mLs by mouth at bedtime as needed for mild constipation.   meclizine 25 MG tablet Commonly known as:  ANTIVERT Take 1 tablet (25 mg total) by mouth 2 (two) times daily.   metFORMIN 1000 MG tablet Commonly known as:  GLUCOPHAGE Take 1,000 mg by mouth 2 (two) times daily.   metolazone 2.5 MG tablet Commonly known as:  ZAROXOLYN Take 2.5 mg by mouth daily.   MINTOX REGULAR STRENGTH 200-200-20 MG/5ML suspension Generic drug:  alum & mag hydroxide-simeth Take 15-30 mLs by mouth as needed for indigestion or heartburn.   neomycin-bacitracin-polymyxin [phone] ointment Apply 1 application topically as needed (for minor cuts and abrasions).   nitroGLYCERIN 0.4 MG SL tablet Commonly known as:  NITROSTAT Place 0.4 mg under the tongue every 5 (five) minutes as needed for chest pain.   Pimozide 1 MG Tabs Take 1 mg by mouth 2 (two) times daily.   Potassium Chloride ER 20 MEQ Tbcr Take 20 mEq by mouth 2 (two) times daily.   risperiDONE 1 MG tablet Commonly known as:  RISPERDAL Take 1.5 mg by mouth 2 (two) times daily.   tamsulosin 0.4 MG Caps capsule Commonly known as:  FLOMAX Take 0.4 mg by mouth daily.   Vitamin D (Ergocalciferol) 50000 units Caps capsule Commonly known as:  DRISDOL Take 50,000 Units by mouth every 7 (seven) days.        DISCHARGE INSTRUCTIONS:  See AVS. If you experience worsening of your admission symptoms, develop shortness of breath, life threatening emergency, suicidal or homicidal thoughts you must seek medical attention immediately by calling 911 or calling your MD immediately  if symptoms  less severe.  You Must read complete instructions/literature along with all the possible adverse reactions/side effects for all the Medicines you take and that have been prescribed to you. Take any new Medicines after you have completely understood and accpet all the possible adverse reactions/side effects.   Please note  You were cared for by a hospitalist during your hospital stay. If you have any questions about your discharge medications or the care you received while you were in the hospital after you are discharged, you can call the unit and asked to speak with the hospitalist on call if the hospitalist that took care of you is not available. Once you are discharged, your primary care physician will handle any further medical issues. Please note that NO REFILLS for any discharge medications will be authorized once you are discharged, as it is imperative that you return to your primary care  physician (or establish a relationship with a primary care physician if you do not have one) for your aftercare needs so that they can reassess your need for medications and monitor your lab values.    On the day of Discharge:  VITAL SIGNS:  Blood pressure (!) 151/84, pulse 71, temperature 98.5 F (36.9 C), temperature source Oral, resp. rate 18, height 6' (1.829 m), weight 134.8 kg, SpO2 98 %. PHYSICAL EXAMINATION:  GENERAL:  67 y.o.-year-old patient lying in the bed with no acute distress.  EYES: Pupils equal, round, reactive to light and accommodation. No scleral icterus. Extraocular muscles intact.  HEENT: Head atraumatic, normocephalic. Oropharynx and nasopharynx clear.  NECK:  Supple, no jugular venous distention. No thyroid enlargement, no tenderness.  LUNGS: Normal breath sounds bilaterally, no wheezing, rales,rhonchi or crepitation. No use of accessory muscles of respiration.  CARDIOVASCULAR: S1, S2 normal. No murmurs, rubs, or gallops.  ABDOMEN: Soft, non-tender, non-distended. Bowel sounds  present. No organomegaly or mass.  EXTREMITIES: No pedal edema, cyanosis, or clubbing.  NEUROLOGIC: Cranial nerves II through XII are intact. Muscle strength 5/5 in all extremities. Sensation intact. Gait not checked.  PSYCHIATRIC: The patient is alert and oriented x 3.  SKIN: No obvious rash, lesion, or ulcer.  DATA REVIEW:   CBC Recent Labs  Lab 08/26/18 0348  WBC 7.3  HGB 11.1*  HCT 33.5*  PLT 201    Chemistries  Recent Labs  Lab 08/25/18 0958 08/26/18 0348  NA 135 135  K 3.4* 3.2*  CL 90* 91*  CO2 32 30  GLUCOSE 267* 260*  BUN 19 22  CREATININE 0.97 1.04  CALCIUM 9.5 8.6*  MG  --  1.3*  AST 27  --   ALT 16  --   ALKPHOS 59  --   BILITOT 0.7  --      Microbiology Results  Results for orders placed or performed during the hospital encounter of 08/16/18  MRSA PCR Screening     Status: Abnormal   Collection Time: 08/16/18  4:54 PM  Result Value Ref Range Status   MRSA by PCR POSITIVE (A) NEGATIVE Final    Comment:        The GeneXpert MRSA Assay (FDA approved for NASAL specimens only), is one component of a comprehensive MRSA colonization surveillance program. It is not intended to diagnose MRSA infection nor to guide or monitor treatment for MRSA infections. RESULT CALLED TO, READ BACK BY AND VERIFIED WITH: CLAIRE DANIEL 08/16/18 @ 1830  MLK Performed at [facility], [address] [place], [place] [address]     RADIOLOGY:  No results found.   Management plans discussed with the patient, family and they are in agreement.  CODE STATUS: Full Code   TOTAL TIME TAKING CARE OF THIS PATIENT: 35 minutes.    Shaune Pollack M.D on 08/26/2018 at 3:33 PM  Between 7am to 6pm - Pager - [phone]  After 6pm go to [email or web address] - Social research officer, government  [facility]  Office  [phone]  CC: Primary care physician; Patient, No Pcp Per   Note: This dictation was prepared with Dragon dictation along with smaller phrase  technology. Any transcriptional errors that result from this process are unintentional.

## 2018-08-26 NOTE — Consult Note (Signed)
Jason [name]   Reason for [name]: Follow-up [name] for this 67 year old man with a history of schizophrenia who was admitted through the emergency room yesterday because of recent dizziness and falls Referring Physician: Bridgett Larsson Patient Identification: Jason [name] MRN:  [id number] Principal Diagnosis: Paranoid schizophrenia ([place]) Diagnosis:   Patient Active Problem List   Diagnosis Date Noted  . Dizziness [R42] 08/25/2018  . Hypokalemia due to inadequate potassium intake [E87.6] 08/16/2018  . Hypertension [I10] 08/07/2018  . Malingering [Z76.5] 08/07/2018  . Intellectual disability [F79] 08/07/2018  . Paranoid schizophrenia ([place]) [F20.0] 07/23/2018  . Diabetes mellitus without complication ([place]) [G40.1] 07/23/2018  . Tourette disorder [F95.2] 07/23/2018    Total Time spent with patient: 30 minutes  Subjective:   Jason [name] is a 67 y.o. male patient admitted with "I hate that group home".  HPI: Patient seen chart reviewed.  Patient known from previous encounters.  Spoke with nursing and hospitalist.  This 67 year old man was admitted through the emergency room with complaints that he had recently had dizziness and had some falls.  After appropriate workup medical team feels he is ready to be discharged.  Patient began acting up today.  Reportedly he threw a chair around the room was agitated and made statements to staff about how he wanted to walk out into the street and get hit by a car.  On interview with me the patient had calm down.  He told me that his main complaint is that he hated his current group home.  He thought that it was messy he thought that there were not enough women around for him to socialize with the thought that the physical structure was not conducive to his enjoyment and that the food was inadequate.  Patient has been trying to ask his guardian for movement to a new facility but of course that takes time and he is only been at this 1 for 65  about a month.  Patient tells me at one point that he does want to walk out into traffic and get hit by a car.  I pointed out to him that the most likely result of this especially in the area where he lives is probably at most of broken bone a concussion and some other injuries but that it is quite likely that he would not die and instead would just wind up in more pain.  Patient acknowledged this.  It is clear that he does not actually want to die he wants to be in a better living situation.  Patient is not responding to internal stimuli does not appear to be hallucinating does not make any delusional statements.  Social history: He has a legal guardian who works for [employer].  He has a few family members but it sounds like they are not very involved in his day-to-day treatment planning.  Reviewing the old information and his testimony as best I can it sounds like he has burned a lot of bridges with group homes resulting in his getting probably something of a reputation is difficult to manage  Medical history: Overweight diabetes that is very poorly controlled high blood pressure quite a bit of ischemic areas and sores on his lower extremities.  Substance abuse history: None  Past Psychiatric History: Patient has a history of a diagnosis of schizophrenia and intellectual disability.  In conversation with me he appears to be fluent in his speech and if he does have any intellectual disability it is minor at most.  He is on antipsychotic and mood stabilizing medicine.  He has a clear history of making suicidal statements and threats to manipulate the situation to be in a better living situation.  Has threatened suicide before.  Has had lengthy hospitalizations in the past mostly as far as I can tell because of difficulty finding placement for him.  Risk to Self:   Risk to Others:   Prior Inpatient Therapy:   Prior Outpatient Therapy:    Past Medical History:  Past Medical History:    Diagnosis Date  . Diabetes mellitus without complication ([place])   . GERD (gastroesophageal reflux disease)   . Hypertension   . Paranoid schizophrenia ([place])   . Tourette disorder     Past Surgical History:  Procedure Laterality Date  . CARDIAC SURGERY    . HAND SURGERY    . TONSILLECTOMY     Family History: History reviewed. No pertinent family history. Family Psychiatric  History: None known Social History:  Social History   Substance and Sexual Activity  Alcohol Use Not Currently     Social History   Substance and Sexual Activity  Drug Use Not Currently    Social History   Socioeconomic History  . Marital status: Single    Spouse name: Not on file  . Number of children: Not on file  . Years of education: Not on file  . Highest education level: Not on file  Occupational History  . Not on file  Social Needs  . Financial resource strain: Not on file  . Food insecurity:    Worry: Not on file    Inability: Not on file  . Transportation needs:    Medical: Not on file    Non-medical: Not on file  Tobacco Use  . Smoking status: Former Research scientist (life sciences)  . Smokeless tobacco: Never Used  Substance and Sexual Activity  . Alcohol use: Not Currently  . Drug use: Not Currently  . Sexual activity: Not Currently  Lifestyle  . Physical activity:    Days per week: Not on file    Minutes per session: Not on file  . Stress: Not on file  Relationships  . Social connections:    Talks on phone: Never    Gets together: Never    Attends religious service: Never    Active member of club or organization: No    Attends meetings of clubs or organizations: Never    Relationship status: Never married  Other Topics Concern  . Not on file  Social History Narrative  . Not on file   Additional Social History:    Allergies:   Allergies  Allergen Reactions  . Keflex [Cephalexin] Rash    Labs:  Results for orders placed or performed during the hospital encounter of 08/25/18 (from the  past 48 hour(s))  CBC     Status: Abnormal   Collection Time: 08/25/18  9:58 AM  Result Value Ref Range   WBC 7.2 4.0 - 10.5 K/uL   RBC 4.63 4.22 - 5.81 MIL/uL   Hemoglobin 12.8 (L) 13.0 - 17.0 g/dL   HCT 38.4 (L) 39.0 - 52.0 %   MCV 82.9 80.0 - 100.0 fL   MCH 27.6 26.0 - 34.0 pg   MCHC 33.3 30.0 - 36.0 g/dL   RDW 15.1 11.5 - 15.5 %   Platelets 224 150 - 400 K/uL   nRBC 0.0 0.0 - 0.2 %    Comment: Performed at [facility], [address], [place], [place] [address]  Comprehensive metabolic panel     Status: Abnormal   Collection Time: 08/25/18  9:58 AM  Result Value Ref Range   Sodium 135 135 - 145 mmol/L   Potassium 3.4 (L) 3.5 - 5.1 mmol/L    Comment: HEMOLYSIS AT THIS LEVEL MAY AFFECT RESULT   Chloride 90 (L) 98 - 111 mmol/L   CO2 32 22 - 32 mmol/L   Glucose, Bld 267 (H) 70 - 99 mg/dL   BUN 19 8 - 23 mg/dL   Creatinine, Ser 0.97 0.61 - 1.24 mg/dL   Calcium 9.5 8.9 - 10.3 mg/dL   Total Protein 7.5 6.5 - 8.1 g/dL   Albumin 3.6 3.5 - 5.0 g/dL   AST 27 15 - 41 U/L   ALT 16 0 - 44 U/L   Alkaline Phosphatase 59 38 - 126 U/L   Total Bilirubin 0.7 0.3 - 1.2 mg/dL   GFR calc non Af Amer >60 >60 mL/min   GFR calc Af Amer >60 >60 mL/min    Comment: (NOTE) The eGFR has been calculated using the CKD EPI equation. This calculation has not been validated in all clinical situations. eGFR's persistently <60 mL/min signify possible Chronic Kidney Disease.    Anion gap 13 5 - 15    Comment: Performed at [facility], [address] [place], [place] [address]  Urinalysis, Complete w Microscopic     Status: Abnormal   Collection Time: 08/25/18  9:58 AM  Result Value Ref Range   Color, Urine STRAW (A) YELLOW   APPearance CLEAR (A) CLEAR   Specific Gravity, Urine 1.006 1.005 - 1.030   pH 7.0 5.0 - 8.0   Glucose, UA >=500 (A) NEGATIVE mg/dL   Hgb urine dipstick SMALL (A) NEGATIVE   Bilirubin Urine NEGATIVE NEGATIVE   Ketones, ur NEGATIVE NEGATIVE mg/dL    Protein, ur NEGATIVE NEGATIVE mg/dL   Nitrite NEGATIVE NEGATIVE   Leukocytes, UA NEGATIVE NEGATIVE   RBC / HPF 0-5 0 - 5 RBC/hpf   WBC, UA 0-5 0 - 5 WBC/hpf   Bacteria, UA NONE SEEN NONE SEEN   Squamous Epithelial / LPF NONE SEEN 0 - 5    Comment: Performed at [facility], [address] [place], [place] [address]  Troponin I     Status: None   Collection Time: 08/25/18  9:58 AM  Result Value Ref Range   Troponin I <0.03 <0.03 ng/mL    Comment: Performed at [facility], [place], [place] [address]  Valproic acid level     Status: Abnormal   Collection Time: 08/25/18  4:33 PM  Result Value Ref Range   Valproic Acid Lvl 20 (L) 50.0 - 100.0 ug/mL    Comment: Performed at [facility], [address] [place], [place] [address]  Ammonia     Status: None   Collection Time: 08/25/18  4:33 PM  Result Value Ref Range   Ammonia 31 9 - 35 umol/L    Comment: Performed at [facility], [address] [place], [place] [address]  Glucose, capillary     Status: Abnormal   Collection Time: 08/25/18  5:06 PM  Result Value Ref Range   Glucose-Capillary 183 (H) 70 - 99 mg/dL  Glucose, capillary     Status: Abnormal   Collection Time: 08/25/18 10:29 PM  Result Value Ref Range   Glucose-Capillary 218 (H) 70 - 99 mg/dL  Basic metabolic panel     Status: Abnormal   Collection  Time: 08/26/18  3:48 AM  Result Value Ref Range   Sodium 135 135 - 145 mmol/L   Potassium 3.2 (L) 3.5 - 5.1 mmol/L   Chloride 91 (L) 98 - 111 mmol/L   CO2 30 22 - 32 mmol/L   Glucose, Bld 260 (H) 70 - 99 mg/dL   BUN 22 8 - 23 mg/dL   Creatinine, Ser 1.04 0.61 - 1.24 mg/dL   Calcium 8.6 (L) 8.9 - 10.3 mg/dL   GFR calc non Af Amer >60 >60 mL/min   GFR calc Af Amer >60 >60 mL/min    Comment: (NOTE) The eGFR has been calculated using the CKD EPI equation. This calculation has not been validated in all clinical situations. eGFR's persistently <60 mL/min signify  possible Chronic Kidney Disease.    Anion gap 14 5 - 15    Comment: Performed at [facility], [address] [place], [place] [address]  CBC     Status: Abnormal   Collection Time: 08/26/18  3:48 AM  Result Value Ref Range   WBC 7.3 4.0 - 10.5 K/uL   RBC 4.01 (L) 4.22 - 5.81 MIL/uL   Hemoglobin 11.1 (L) 13.0 - 17.0 g/dL   HCT 33.5 (L) 39.0 - 52.0 %   MCV 83.5 80.0 - 100.0 fL   MCH 27.7 26.0 - 34.0 pg   MCHC 33.1 30.0 - 36.0 g/dL   RDW 14.9 11.5 - 15.5 %   Platelets 201 150 - 400 K/uL   nRBC 0.0 0.0 - 0.2 %    Comment: Performed at [facility], [address], [place], [place] [address]  Magnesium     Status: Abnormal   Collection Time: 08/26/18  3:48 AM  Result Value Ref Range   Magnesium 1.3 (L) 1.7 - 2.4 mg/dL    Comment: Performed at [facility], [address] [place], [place] [address]  Glucose, capillary     Status: Abnormal   Collection Time: 08/26/18  7:47 AM  Result Value Ref Range   Glucose-Capillary 233 (H) 70 - 99 mg/dL    Current Facility-Administered Medications  Medication Dose Route Frequency Provider Last Rate Last Dose  . acetaminophen (TYLENOL) tablet 650 mg  650 mg Oral Q6H PRN Epifanio Lesches, MD       Or  . acetaminophen (TYLENOL) suppository 650 mg  650 mg Rectal Q6H PRN Epifanio Lesches, MD      . alum & mag hydroxide-simeth (MAALOX/MYLANTA) 200-200-20 MG/5ML suspension 15-30 mL  15-30 mL Oral PRN Epifanio Lesches, MD      . amLODipine (NORVASC) tablet 10 mg  10 mg Oral Daily Epifanio Lesches, MD   10 mg at 08/26/18 0930  . aspirin EC tablet 325 mg  325 mg Oral Daily Epifanio Lesches, MD   325 mg at 08/26/18 0930  . benztropine (COGENTIN) tablet 0.5 mg  0.5 mg Oral BID Epifanio Lesches, MD   0.5 mg at 08/26/18 0929  . bisacodyl (DULCOLAX) EC tablet 5 mg  5 mg Oral Daily PRN Epifanio Lesches, MD      . carvedilol (COREG) tablet 6.25 mg  6.25 mg Oral BID Epifanio Lesches, MD   6.25 mg at  08/26/18 0929  . Chlorhexidine Gluconate Cloth 2 % PADS 6 each  6 each Topical Q0600 Epifanio Lesches, MD      . divalproex (DEPAKOTE ER) 24 hr tablet 1,000 mg  1,000 mg Oral Daily Epifanio Lesches, MD   1,000 mg at 08/26/18 0929  . docusate sodium (  COLACE) capsule 100 mg  100 mg Oral BID Epifanio Lesches, MD   100 mg at 08/26/18 0928  . ezetimibe (ZETIA) tablet 10 mg  10 mg Oral Daily Epifanio Lesches, MD   10 mg at 08/26/18 0930  . furosemide (LASIX) tablet 40 mg  40 mg Oral BID Epifanio Lesches, MD   40 mg at 08/26/18 0826  . gabapentin (NEURONTIN) capsule 300 mg  300 mg Oral TID Epifanio Lesches, MD   300 mg at 08/26/18 0929  . glipiZIDE (GLUCOTROL) tablet 5 mg  5 mg Oral Daily Epifanio Lesches, MD   5 mg at 08/26/18 0929  . guaiFENesin (ROBITUSSIN) 100 MG/5ML solution 200 mg  10 mL Oral Q6H PRN Epifanio Lesches, MD      . haloperidol lactate (HALDOL) injection 1 mg  1 mg Intravenous Q6H PRN Demetrios Loll, MD   1 mg at 08/26/18 0835  . heparin injection 5,000 Units  5,000 Units Subcutaneous Q8H Epifanio Lesches, MD   5,000 Units at 08/26/18 0607  . HYDROcodone-acetaminophen (NORCO/VICODIN) 5-325 MG per tablet 1-2 tablet  1-2 tablet Oral Q4H PRN Epifanio Lesches, MD      . insulin aspart (novoLOG) injection 0-15 Units  0-15 Units Subcutaneous TID WC Epifanio Lesches, MD   5 Units at 08/26/18 0826  . insulin aspart (novoLOG) injection 0-5 Units  0-5 Units Subcutaneous QHS Epifanio Lesches, MD   2 Units at 08/25/18 2241  . insulin aspart (novoLOG) injection 10 Units  10 Units Subcutaneous TID WC Epifanio Lesches, MD   10 Units at 08/26/18 0827  . insulin glargine (LANTUS) injection 28 Units  28 Units Subcutaneous QHS Epifanio Lesches, MD   28 Units at 08/25/18 2240  . isosorbide mononitrate (IMDUR) 24 hr tablet 60 mg  60 mg Oral Daily Epifanio Lesches, MD   60 mg at 08/26/18 0928  . levofloxacin (LEVAQUIN) tablet 500 mg  500 mg Oral Daily  Epifanio Lesches, MD   500 mg at 08/26/18 0930  . LORazepam (ATIVAN) tablet 0.5 mg  0.5 mg Oral TID PRN Epifanio Lesches, MD   0.5 mg at 08/26/18 0832  . magnesium sulfate IVPB 4 g 100 mL  4 g Intravenous Once Demetrios Loll, MD 50 mL/hr at 08/26/18 1131 4 g at 08/26/18 1131  . meclizine (ANTIVERT) tablet 25 mg  25 mg Oral BID Epifanio Lesches, MD   25 mg at 08/26/18 0929  . metFORMIN (GLUCOPHAGE) tablet 1,000 mg  1,000 mg Oral BID WC Epifanio Lesches, MD   1,000 mg at 08/26/18 0826  . metolazone (ZAROXOLYN) tablet 2.5 mg  2.5 mg Oral Daily Epifanio Lesches, MD   2.5 mg at 08/26/18 0928  . mupirocin ointment (BACTROBAN) 2 % 1 application  1 application Nasal BID Epifanio Lesches, MD   1 application at 38/45/36 2240  . neomycin-bacitracin-polymyxin (NEOSPORIN) ointment 1 application  1 application Topical PRN Epifanio Lesches, MD      . ondansetron (ZOFRAN) tablet 4 mg  4 mg Oral Q6H PRN Epifanio Lesches, MD       Or  . ondansetron (ZOFRAN) injection 4 mg  4 mg Intravenous Q6H PRN Epifanio Lesches, MD   4 mg at 08/25/18 1804  . Pimozide TABS 1 mg  1 mg Oral BID Epifanio Lesches, MD      . potassium chloride (KLOR-CON) packet 20 mEq  20 mEq Oral BID Epifanio Lesches, MD   20 mEq at 08/25/18 2240  . potassium chloride SA (K-DUR,KLOR-CON) CR tablet 40 mEq  40 mEq Oral  Once Demetrios Loll, MD      . risperiDONE (RISPERDAL) tablet 1.5 mg  1.5 mg Oral BID Epifanio Lesches, MD   1.5 mg at 08/26/18 0929  . traZODone (DESYREL) tablet 25 mg  25 mg Oral QHS PRN Epifanio Lesches, MD      . Vitamin D (Ergocalciferol) (DRISDOL) capsule 50,000 Units  50,000 Units Oral Q7 days Epifanio Lesches, MD   50,000 Units at 08/26/18 1856    Musculoskeletal: Strength & Muscle Tone: decreased Gait & Station: unsteady Patient leans: N/A  Psychiatric Specialty Exam: Physical Exam  Nursing note and vitals reviewed. Constitutional: He appears well-developed and  well-nourished.  HENT:  Head: Normocephalic and atraumatic.  Eyes: Pupils are equal, round, and reactive to light. Conjunctivae are normal.  Neck: Normal range of motion.  Cardiovascular: Regular rhythm and normal heart sounds.  Respiratory: Effort normal and breath sounds normal.  GI: Soft.  Musculoskeletal: Normal range of motion.  Neurological: He is alert.  Skin: Skin is warm and dry.     Psychiatric: His speech is normal and behavior is normal. Thought content normal. His affect is blunt. Cognition and memory are impaired. He expresses impulsivity and inappropriate judgment.    Review of Systems  Constitutional: Negative.   HENT: Negative.   Eyes: Negative.   Respiratory: Negative.   Cardiovascular: Negative.   Gastrointestinal: Negative.   Musculoskeletal: Negative.   Skin: Negative.   Neurological: Negative.   Psychiatric/Behavioral: Positive for depression and suicidal ideas. Negative for hallucinations, memory loss and substance abuse. The patient is nervous/anxious. The patient does not have insomnia.     Blood pressure (!) 151/84, pulse 71, temperature 98.5 F (36.9 C), temperature source Oral, resp. rate 18, height 6' (1.829 m), weight 134.8 kg, SpO2 98 %.Body mass index is 40.3 kg/m.  General Appearance: Casual  Eye Contact:  Fair  Speech:  Slow  Volume:  Decreased  Mood:  Dysphoric  Affect:  Constricted  Thought Process:  Goal Directed  Orientation:  Full (Time, Place, and Person)  Thought Content:  Logical  Suicidal Thoughts:  Yes.  without intent/plan  Homicidal Thoughts:  No  Memory:  Immediate;   Fair Recent;   Fair Remote;   Fair  Judgement:  Impaired  Insight:  Shallow  Psychomotor Activity:  Decreased  Concentration:  Concentration: Poor  Recall:  Poor  Fund of Knowledge:  Fair  Language:  Fair  Akathisia:  No  Handed:  Right  AIMS (if indicated):     Assets:  Desire for Improvement Housing Resilience  ADL's:  Intact  Cognition:   Impaired,  Mild  Sleep:        Treatment Plan Summary: Plan This is a 67 year old man with chronic disability who has been worked up and treated and is ready for discharge from the medical service.  Today he started making various complaints at one point saying he was having psychotic symptoms on a couple of occasions voicing his statement that he wants to walk out into traffic at one point getting agitated.  By the time I saw him he had calmed down quite a bit and clearly can control his behavior.  Despite his frequent statements that he thinks he will go walk out into the street there does not appear to be any evidence that he has made serious efforts to harm himself in the past.  Everything about the context and history seems clear that this is a manipulative behavior because he genuinely dislikes his group home and is  frustrated at not having control over his situation.  Patient is very unlikely to benefit from any inpatient hospitalization.  Instead, hospitalization is most likely to just reinforce problematic behavior.  I spent some time trying to provide some empathetic support about how frustrating it is to not have control of your living situation and advised him that probably the best thing he could do would be to make the best of the situation without showing any acting out behavior while making sure that his guardian is aware of his desire.  Patient is currently calm not agitated not threatening.  Spoke with the hospitalist and nursing.  I would recommend that if he is no longer in need of medical hospitalization that he be discharged back to his current facility.  I informed the patient that this was the plan and he appeared to be accepting of it saying that he only would like to get his lunch before he goes.  Disposition: No evidence of imminent risk to self or others at present.   Patient does not meet criteria for psychiatric inpatient admission. Supportive therapy provided about ongoing  stressors. Discussed crisis plan, support from social network, calling 911, coming to the Emergency Department, and calling Suicide Hotline.  Alethia Berthold, MD 08/26/2018 11:56 AM

## 2018-08-26 NOTE — [place] FL2 (Addendum)
[place] MEDICAID FL2 LEVEL OF CARE SCREENING TOOL     IDENTIFICATION  Patient Name: Jason [name] Birthdate: 12-26-1950 Sex: male Admission Date (Current Location): 08/25/2018  [place] and [place] Number:  [address] [id number] T Facility and Address:  [facility], [address] [place], [place] [address]      Provider Number: [id number]  Attending Physician Name and Address:  Shaune Pollack, MD  Relative Name and Phone Number:  hall,candy Other [phone]     Current Level of Care: Hospital Recommended Level of Care: [facility] Prior Approval Number:    Date Approved/Denied:   PASRR Number:    Discharge Plan: Domiciliary (Rest home)    Current Diagnoses: Patient Active Problem List   Diagnosis Date Noted  . Dizziness 08/25/2018  . Hypokalemia due to inadequate potassium intake 08/16/2018  . Hypertension 08/07/2018  . Malingering 08/07/2018  . Intellectual disability 08/07/2018  . Paranoid schizophrenia (HCC) 07/23/2018  . Diabetes mellitus without complication (HCC) 07/23/2018  . Tourette disorder 07/23/2018    Orientation RESPIRATION BLADDER Height & Weight     Self, Situation, Place, Time  Normal Continent Weight: 297 lb 2.9 oz (134.8 kg) Height:  6' (182.9 cm)  BEHAVIORAL SYMPTOMS/MOOD NEUROLOGICAL BOWEL NUTRITION STATUS      Continent Diet  Carb modified  AMBULATORY STATUS COMMUNICATION OF NEEDS Skin   Limited Assist Verbally Normal                       Personal Care Assistance Level of Assistance  Bathing, Feeding, Dressing, Total care Bathing Assistance: Limited assistance Feeding assistance: Independent Dressing Assistance: Independent Total Care Assistance: Limited assistance   Functional Limitations Info  Sight, Hearing, Speech Sight Info: Adequate Hearing Info: Impaired Speech Info: Adequate    SPECIAL CARE FACTORS FREQUENCY                       Contractures Contractures Info: Not present     Additional Factors Info  Insulin Sliding Scale, Allergies, Psychotropic, Code Status Code Status Info: Full Code Allergies Info: KEFLEX CEPHALEXIN  Psychotropic Info: divalproex (DEPAKOTE ER) 24 hr tablet 1,000 mg  risperiDONE (RISPERDAL) tablet 1.5 mg  Insulin Sliding Scale Info: insulin aspart (novoLOG) injection 0-15 Units 3x a day with meals       Current Medications (08/26/2018):  This is the current hospital active medication list Current Facility-Administered Medications  Medication Dose Route Frequency Provider Last Rate Last Dose  . acetaminophen (TYLENOL) tablet 650 mg  650 mg Oral Q6H PRN Katha Hamming, MD       Or  . acetaminophen (TYLENOL) suppository 650 mg  650 mg Rectal Q6H PRN Katha Hamming, MD      . alum & mag hydroxide-simeth (MAALOX/MYLANTA) 200-200-20 MG/5ML suspension 15-30 mL  15-30 mL Oral PRN Katha Hamming, MD      . amLODipine (NORVASC) tablet 10 mg  10 mg Oral Daily Katha Hamming, MD   10 mg at 08/26/18 0930  . aspirin EC tablet 325 mg  325 mg Oral Daily Katha Hamming, MD   325 mg at 08/26/18 0930  . benztropine (COGENTIN) tablet 0.5 mg  0.5 mg Oral BID Katha Hamming, MD   0.5 mg at 08/26/18 0929  . bisacodyl (DULCOLAX) EC tablet 5 mg  5 mg Oral Daily PRN Katha Hamming, MD      . carvedilol (COREG) tablet 6.25 mg  6.25 mg Oral BID Katha Hamming, MD   6.25 mg at  08/26/18 0929  . Chlorhexidine Gluconate Cloth 2 % PADS 6 each  6 each Topical Q0600 Katha Hamming, MD      . divalproex (DEPAKOTE ER) 24 hr tablet 1,000 mg  1,000 mg Oral Daily Katha Hamming, MD   1,000 mg at 08/26/18 0929  . docusate sodium (COLACE) capsule 100 mg  100 mg Oral BID Katha Hamming, MD   100 mg at 08/26/18 0928  . ezetimibe (ZETIA) tablet 10 mg  10 mg Oral Daily Katha Hamming, MD   10 mg at 08/26/18 0930  . furosemide (LASIX) tablet 40 mg  40 mg Oral BID Katha Hamming, MD   40 mg at 08/26/18 0826   . gabapentin (NEURONTIN) capsule 300 mg  300 mg Oral TID Katha Hamming, MD   300 mg at 08/26/18 0929  . glipiZIDE (GLUCOTROL) tablet 5 mg  5 mg Oral Daily Katha Hamming, MD   5 mg at 08/26/18 0929  . guaiFENesin (ROBITUSSIN) 100 MG/5ML solution 200 mg  10 mL Oral Q6H PRN Katha Hamming, MD      . haloperidol lactate (HALDOL) injection 1 mg  1 mg Intravenous Q6H PRN Shaune Pollack, MD   1 mg at 08/26/18 0835  . heparin injection 5,000 Units  5,000 Units Subcutaneous Q8H Katha Hamming, MD   5,000 Units at 08/26/18 0607  . HYDROcodone-acetaminophen (NORCO/VICODIN) 5-325 MG per tablet 1-2 tablet  1-2 tablet Oral Q4H PRN Katha Hamming, MD      . insulin aspart (novoLOG) injection 0-15 Units  0-15 Units Subcutaneous TID WC Katha Hamming, MD   5 Units at 08/26/18 1236  . insulin aspart (novoLOG) injection 0-5 Units  0-5 Units Subcutaneous QHS Katha Hamming, MD   2 Units at 08/25/18 2241  . insulin aspart (novoLOG) injection 10 Units  10 Units Subcutaneous TID WC Katha Hamming, MD   10 Units at 08/26/18 1237  . insulin glargine (LANTUS) injection 28 Units  28 Units Subcutaneous QHS Katha Hamming, MD   28 Units at 08/25/18 2240  . isosorbide mononitrate (IMDUR) 24 hr tablet 60 mg  60 mg Oral Daily Katha Hamming, MD   60 mg at 08/26/18 0928  . levofloxacin (LEVAQUIN) tablet 500 mg  500 mg Oral Daily Katha Hamming, MD   500 mg at 08/26/18 0930  . LORazepam (ATIVAN) tablet 0.5 mg  0.5 mg Oral TID PRN Katha Hamming, MD   0.5 mg at 08/26/18 0832  . magnesium sulfate IVPB 4 g 100 mL  4 g Intravenous Once Shaune Pollack, MD 50 mL/hr at 08/26/18 1131 4 g at 08/26/18 1131  . meclizine (ANTIVERT) tablet 25 mg  25 mg Oral BID Katha Hamming, MD   25 mg at 08/26/18 0929  . metFORMIN (GLUCOPHAGE) tablet 1,000 mg  1,000 mg Oral BID WC Katha Hamming, MD   1,000 mg at 08/26/18 0826  . metolazone (ZAROXOLYN) tablet 2.5 mg  2.5 mg Oral  Daily Katha Hamming, MD   2.5 mg at 08/26/18 0928  . mupirocin ointment (BACTROBAN) 2 % 1 application  1 application Nasal BID Katha Hamming, MD   1 application at 08/25/18 2240  . neomycin-bacitracin-polymyxin (NEOSPORIN) ointment 1 application  1 application Topical PRN Katha Hamming, MD      . ondansetron (ZOFRAN) tablet 4 mg  4 mg Oral Q6H PRN Katha Hamming, MD       Or  . ondansetron (ZOFRAN) injection 4 mg  4 mg Intravenous Q6H PRN Katha Hamming, MD   4 mg at 08/25/18 1804  .  Pimozide TABS 1 mg  1 mg Oral BID Katha Hamming, MD      . potassium chloride (KLOR-CON) packet 20 mEq  20 mEq Oral BID Katha Hamming, MD   20 mEq at 08/25/18 2240  . potassium chloride SA (K-DUR,KLOR-CON) CR tablet 40 mEq  40 mEq Oral Once Shaune Pollack, MD      . risperiDONE (RISPERDAL) tablet 1.5 mg  1.5 mg Oral BID Katha Hamming, MD   1.5 mg at 08/26/18 0929  . traZODone (DESYREL) tablet 25 mg  25 mg Oral QHS PRN Katha Hamming, MD      . Vitamin D (Ergocalciferol) (DRISDOL) capsule 50,000 Units  50,000 Units Oral Q7 days Katha Hamming, MD   50,000 Units at 08/26/18 7829     Discharge Medications: TAKE these medications   acetaminophen 500 MG tablet Commonly known as:  TYLENOL Take 500 mg by mouth every 4 (four) hours as needed for mild pain or fever.   amLODipine 10 MG tablet Commonly known as:  NORVASC Take 10 mg by mouth daily.   amoxicillin-clavulanate 875-125 MG tablet Commonly known as:  AUGMENTIN Take 1 tablet by mouth 2 (two) times daily.   aspirin EC 325 MG tablet Take 325 mg by mouth daily.   atorvastatin 80 MG tablet Commonly known as:  LIPITOR Take 80 mg by mouth at bedtime.   benztropine 0.5 MG tablet Commonly known as:  COGENTIN Take 0.5 mg by mouth 2 (two) times daily.   carvedilol 6.25 MG tablet Commonly known as:  COREG Take 6.25 mg by mouth 2 (two) times daily.   clopidogrel 75 MG tablet Commonly  known as:  PLAVIX Take 75 mg by mouth daily.   divalproex 500 MG 24 hr tablet Commonly known as:  DEPAKOTE ER Take 1,000 mg by mouth daily. At 1600   ezetimibe 10 MG tablet Commonly known as:  ZETIA Take 10 mg by mouth daily.   furosemide 40 MG tablet Commonly known as:  LASIX Take 1 tablet (40 mg total) by mouth 2 (two) times daily.   gabapentin 300 MG capsule Commonly known as:  NEURONTIN Take 300 mg by mouth 3 (three) times daily.   glipiZIDE 5 MG tablet Commonly known as:  GLUCOTROL Take 5 mg by mouth daily.   guaiFENesin 100 MG/5ML Soln Commonly known as:  ROBITUSSIN Take 10 mLs by mouth every 6 (six) hours as needed for cough.   insulin glargine 100 UNIT/ML injection Commonly known as:  LANTUS Inject 28-100 Units into the skin See admin instructions. 28 units every morning and 100 units every evening   insulin lispro 100 UNIT/ML injection Commonly known as:  HUMALOG Inject 57 Units into the skin 3 (three) times daily before meals.   isosorbide mononitrate 60 MG 24 hr tablet Commonly known as:  IMDUR Take 60 mg by mouth daily.   loperamide 2 MG capsule Commonly known as:  IMODIUM Take 2 mg by mouth daily as needed for diarrhea or loose stools.   LORazepam 0.5 MG tablet Commonly known as:  ATIVAN Take 0.5 mg by mouth 3 (three) times daily as needed for anxiety.   magnesium hydroxide 400 MG/5ML suspension Commonly known as:  MILK OF MAGNESIA Take 30 mLs by mouth at bedtime as needed for mild constipation.   meclizine 25 MG tablet Commonly known as:  ANTIVERT Take 1 tablet (25 mg total) by mouth 2 (two) times daily.   metFORMIN 1000 MG tablet Commonly known as:  GLUCOPHAGE Take 1,000 mg by mouth  2 (two) times daily.   metolazone 2.5 MG tablet Commonly known as:  ZAROXOLYN Take 2.5 mg by mouth daily.   MINTOX REGULAR STRENGTH 200-200-20 MG/5ML suspension Generic drug:  alum & mag hydroxide-simeth Take 15-30 mLs by mouth as needed for  indigestion or heartburn.   neomycin-bacitracin-polymyxin [phone] ointment Apply 1 application topically as needed (for minor cuts and abrasions).   nitroGLYCERIN 0.4 MG SL tablet Commonly known as:  NITROSTAT Place 0.4 mg under the tongue every 5 (five) minutes as needed for chest pain.   Pimozide 1 MG Tabs Take 1 mg by mouth 2 (two) times daily.   Potassium Chloride ER 20 MEQ Tbcr Take 20 mEq by mouth 2 (two) times daily.   risperiDONE 1 MG tablet Commonly known as:  RISPERDAL Take 1.5 mg by mouth 2 (two) times daily.   tamsulosin 0.4 MG Caps capsule Commonly known as:  FLOMAX Take 0.4 mg by mouth daily.   Vitamin D (Ergocalciferol) 50000 units Caps capsule Commonly known as:  DRISDOL Take 50,000 Units by mouth every 7 (seven) days.     Relevant Imaging Results:  Relevant Lab Results:   Additional Information SSN [id number]  Darleene Cleaver, [place]

## 2018-08-26 NOTE — Clinical Social Work Note (Signed)
Clinical Social Work Assessment  Patient Details  Name: Jason [name] MRN: [id number] Date of Birth: February 16, 1951  Date of referral:  08/26/18               Reason for consult:  Facility Placement                Permission sought to share information with:  Facility Medical sales representative, Guardian Permission granted to share information::  Yes, Verbal Permission Granted  Name::     [facility] Group home supervisor [phone] and Zipporah Plants [phone] legal guardian  Agency::  [facility]  Relationship::     Contact Information:     Housing/Transportation Living arrangements for the past 2 months:  Group Home Source of Information:  Patient, Medical Team Patient Interpreter Needed:  None Criminal Activity/Legal Involvement Pertinent to Current Situation/Hospitalization:  No - Comment as needed Significant Relationships:  Merchandiser, retail, Mental Health Provider Lives with:  Facility Resident Do you feel safe going back to the place where you live?  Yes Need for family participation in patient care:  No (Coment)  Care giving concerns:  Patient expressed he does not like his group home, but is in agreement to returning back.   Social Worker assessment / plan:  Patient is a 67 year old male who is from a family care home.  Patient was just recently admitted to the hospital last week.  Patient stated he does not like his group home, and does not want to return.  CSW discussed with him that his legal guardian is aware that he would like a different group home and she states she is working on new placement.  CSW spoke with family care home manager, who said patient has only been at group home for 4 weeks, and they are trying to accommodate patient however he still complains of his roommates.  Patient states he will continue to wait for new placement.  Patient did not have any other questions or concerns.  Employment status:  Disabled (Comment on whether or not currently receiving  Disability) Insurance information:  Medicare, Medicaid In [place] PT Recommendations:  Not assessed at this time Information / Referral to community resources:     Patient/Family's Response to care: Patient agreeable to returning back to family care home however he does not really want to return.  Patient/Family's Understanding of and Emotional Response to Diagnosis, Current Treatment, and Prognosis:  Patient expressed he does not really like his group home because there are no women he can talk to, but he is agreeable to return.  Emotional Assessment Appearance:  Appears stated age Attitude/Demeanor/Rapport:  Engaged Affect (typically observed):  Accepting, Calm Orientation:  Oriented to Self, Oriented to Place, Oriented to  Time, Oriented to Situation Alcohol / Substance use:  Not Applicable Psych involvement (Current and /or in the community):  Yes (Comment)  Discharge Needs  Concerns to be addressed:  Care Coordination Readmission within the last 30 days:  Yes(08-17-18 back to family care home.) Current discharge risk:  None Barriers to Discharge:  No Barriers Identified   Darleene Cleaver, LCSWA 08/26/2018, 6:18 PM

## 2018-08-26 NOTE — Plan of Care (Signed)
  Problem: Education: Goal: Knowledge of General Education information will improve Description Including pain rating scale, medication(s)/side effects and non-pharmacologic comfort measures Outcome: Not Progressing Note:  Patient confused, agitated and hallucinating this morning. To be discharged to group home later today. Awaiting final approval from physician / social work. Jari Favre [facility]

## 2018-08-26 NOTE — Clinical Social Work Note (Signed)
CSW spoke to [employer] of family care home, she said that they can accept patient back today via cab transportation.  CSW attempted to contact patient's legal guardian Zipporah Plants, [phone], left a message awaiting call back.  CSW faxed discharge summary, FL2, and psych notes to family care home per [facility]'s request.  Patient to be discharged today via cab, CSW contacted [employer] cab services at 4:45 to inform them that patient is ready for discharge back to group home per request by group home manager.  Ervin Knack. Robbi Scurlock, MSW, [facility]ors [phone]  08/26/2018 6:14 PM

## 2018-08-27 NOTE — Clinical Social Work Note (Signed)
08-27-18 3:19pm  CSW Received phone call back from patient's legal guardian Zipporah Plants [phone].  CSW received fax number and faxed discharge summary, FL2, and Psych notes to [phone].  CSW informed her that patient had returned to family care home on Wednesday, 08/26/18.  Ervin Knack. Izacc Demeyer, MSW, [facility] [phone]  08/27/2018 3:20 PM

## 2018-09-06 ENCOUNTER — Other Ambulatory Visit: Payer: Self-pay

## 2018-09-06 ENCOUNTER — Emergency Department
Admission: EM | Admit: 2018-09-06 | Discharge: 2018-09-07 | Disposition: A | Discharge status: Home / Self Care | Payer: Medicare Other | Attending: Emergency Medicine | Admitting: Emergency Medicine

## 2018-09-06 DIAGNOSIS — F200 Paranoid schizophrenia: Secondary | ICD-10-CM | POA: Diagnosis present

## 2018-09-06 DIAGNOSIS — I10 Essential (primary) hypertension: Secondary | ICD-10-CM | POA: Insufficient documentation

## 2018-09-06 DIAGNOSIS — R45851 Suicidal ideations: Secondary | ICD-10-CM | POA: Insufficient documentation

## 2018-09-06 DIAGNOSIS — Z7982 Long term (current) use of aspirin: Secondary | ICD-10-CM | POA: Diagnosis not present

## 2018-09-06 DIAGNOSIS — Z794 Long term (current) use of insulin: Secondary | ICD-10-CM | POA: Insufficient documentation

## 2018-09-06 DIAGNOSIS — E119 Type 2 diabetes mellitus without complications: Secondary | ICD-10-CM | POA: Diagnosis not present

## 2018-09-06 DIAGNOSIS — Z87891 Personal history of nicotine dependence: Secondary | ICD-10-CM | POA: Diagnosis not present

## 2018-09-06 DIAGNOSIS — Z7902 Long term (current) use of antithrombotics/antiplatelets: Secondary | ICD-10-CM | POA: Insufficient documentation

## 2018-09-06 DIAGNOSIS — F79 Unspecified intellectual disabilities: Secondary | ICD-10-CM

## 2018-09-06 DIAGNOSIS — Z79899 Other long term (current) drug therapy: Secondary | ICD-10-CM | POA: Insufficient documentation

## 2018-09-06 DIAGNOSIS — Z765 Malingerer [conscious simulation]: Secondary | ICD-10-CM

## 2018-09-06 DIAGNOSIS — E079 Disorder of thyroid, unspecified: Secondary | ICD-10-CM

## 2018-09-06 DIAGNOSIS — F99 Mental disorder, not otherwise specified: Secondary | ICD-10-CM | POA: Diagnosis present

## 2018-09-06 LAB — CBC
HEMATOCRIT: 36.3 % — AB (ref 39.0–52.0)
HEMOGLOBIN: 12.1 g/dL — AB (ref 13.0–17.0)
MCH: 27.8 pg (ref 26.0–34.0)
MCHC: 33.3 g/dL (ref 30.0–36.0)
MCV: 83.4 fL (ref 80.0–100.0)
NRBC: 0 % (ref 0.0–0.2)
Platelets: 268 10*3/uL (ref 150–400)
RBC: 4.35 MIL/uL (ref 4.22–5.81)
RDW: 14.7 % (ref 11.5–15.5)
WBC: 8.8 10*3/uL (ref 4.0–10.5)

## 2018-09-06 LAB — COMPREHENSIVE METABOLIC PANEL
ALT: 15 U/L (ref 0–44)
AST: 18 U/L (ref 15–41)
Albumin: 4 g/dL (ref 3.5–5.0)
Alkaline Phosphatase: 58 U/L (ref 38–126)
Anion gap: 13 (ref 5–15)
BUN: 19 mg/dL (ref 8–23)
CHLORIDE: 94 mmol/L — AB (ref 98–111)
CO2: 31 mmol/L (ref 22–32)
Calcium: 9.1 mg/dL (ref 8.9–10.3)
Creatinine, Ser: 1.2 mg/dL (ref 0.61–1.24)
GFR calc Af Amer: >60 mL/min (ref >60)
GLUCOSE: 84 mg/dL (ref 70–99)
POTASSIUM: 3 mmol/L — AB (ref 3.5–5.1)
Sodium: 138 mmol/L (ref 135–145)
Total Bilirubin: 0.5 mg/dL (ref 0.3–1.2)
Total Protein: 7.4 g/dL (ref 6.5–8.1)

## 2018-09-06 LAB — ETHANOL

## 2018-09-06 LAB — ACETAMINOPHEN LEVEL: Acetaminophen (Tylenol), Serum: <10 ug/mL — ABNORMAL LOW (ref 10–30)

## 2018-09-06 LAB — SALICYLATE LEVEL: Salicylate Lvl: <7 mg/dL (ref 2.8–30.0)

## 2018-09-06 NOTE — ED Notes (Signed)
Pt changed into scrubs and belongings secured. Shorts, tank top, tennis shoes, fleece jacket. Pt has own personal bag with another set of clothes.

## 2018-09-06 NOTE — ED Triage Notes (Addendum)
Pt comes via [facility] from [facility] with c/o SI per facility. According to note written by Rosey Bath she stated that the patient stated he wanted to commit suicide and packed a bag and began walking up the road. Pt was almost hit by a car. Pt also banging his fists on door and per facility afraid he is going to harm himself.   Pt calm and cooperative. Pt states he doesn't like where he is living. Pt denies any SI/HI. Pt states if he goes back he will just walk away again. Pt states they are not taking good care of him especially with his blood sugars. Pt states they give me insulin and don't even check my sugar first.

## 2018-09-07 DIAGNOSIS — Z765 Malingerer [conscious simulation]: Secondary | ICD-10-CM

## 2018-09-07 LAB — URINE DRUG SCREEN, QUALITATIVE (ARMC ONLY)
Amphetamines, Ur Screen: NOT DETECTED
BARBITURATES, UR SCREEN: NOT DETECTED
Benzodiazepine, Ur Scrn: NOT DETECTED
CANNABINOID 50 NG, UR ~~LOC~~: NOT DETECTED
Cocaine Metabolite,Ur ~~LOC~~: NOT DETECTED
MDMA (Ecstasy)Ur Screen: NOT DETECTED
Methadone Scn, Ur: NOT DETECTED
Opiate, Ur Screen: NOT DETECTED
PHENCYCLIDINE (PCP) UR S: NOT DETECTED
Tricyclic, Ur Screen: NOT DETECTED

## 2018-09-07 NOTE — Consult Note (Signed)
  Psychiatry: Brief note, full note to follow.  Patient with chronic behavior problems.  His current behavior problems are not the result of his mental illness.  He is not suffering from major depression or psychosis or mania.  He does have some chronic risk of putting himself in dangerous situations but there is no medication or treatment in a hospital setting that is likely to have any effect on that.  Patient does not meet commitment criteria does not require inpatient hospitalization.  Case reviewed with ER doctor.  Recommend patient be discharged home.

## 2018-09-07 NOTE — ED Notes (Signed)
Pt discharged back to [facility] family home in care of group home staff member. VS stable. All belongings returned to patient. Discharge paperwork signed by group home staff.  Pt denies SI/HI. Legal guardian notified and accepting of disposition.

## 2018-09-07 NOTE — ED Notes (Signed)
Pt dressing for discharge. Maintained on 15 minute checks and observation by security camera for safety. 

## 2018-09-07 NOTE — Consult Note (Signed)
[facility] Consult   Reason for Consult: Consult for this 67 year old man who came into the emergency room after wandering away from his group home Referring Physician: Joni Fears Patient Identification: Jason [name] MRN:  [id number] Principal Diagnosis: Malingering Diagnosis:   Patient Active Problem List   Diagnosis Date Noted  . Dizziness [R42] 08/25/2018  . Hypokalemia due to inadequate potassium intake [E87.6] 08/16/2018  . Hypertension [I10] 08/07/2018  . Malingering [Z76.5] 08/07/2018  . Intellectual disability [F79] 08/07/2018  . Paranoid schizophrenia ([place]) [F20.0] 07/23/2018  . Diabetes mellitus without complication ([place]) [Y60.6] 07/23/2018  . Tourette disorder [F95.2] 07/23/2018    Total Time spent with patient: 1 hour  Subjective:   Jason [name] is a 67 y.o. male patient admitted with "I told you I would walk away and I did".  HPI: Patient seen chart reviewed.  67 year old man walked away from his group home.  He tells me that he had no particular destination in mind.  In fact you knew that what would happen is that they would call the police and come apprehend him and bring him to the hospital.  Patient says he did this because he hates living at his group home.  Once again he cites such things as the fact that they do not serve him orange juice in the morning as reasons for this.  It is clear that the patient does not wish to die.  Has multiple positive plans for the future and wishes for things to be better.  He says he did this entirely because he wants to get away from his group home and thinks that is guardian is not acting fast enough.  Does not report any delusional content.  Social history: Has a legal guardian.  Currently residing in a group home.  Medical history: Diabetes overweight  Substance abuse history: None  Past Psychiatric History: Patient has a long history of mental illness and behavior problems.  Has been through multiple group  homes because of his behavioral problems.  Also reported history of some degree of intellectual disability.  Not actively psychotic.  Risk to Self:   Risk to Others:   Prior Inpatient Therapy:   Prior Outpatient Therapy:    Past Medical History:  Past Medical History:  Diagnosis Date  . Diabetes mellitus without complication ([place])   . GERD (gastroesophageal reflux disease)   . Hypertension   . Paranoid schizophrenia ([place])   . Tourette disorder     Past Surgical History:  Procedure Laterality Date  . CARDIAC SURGERY    . HAND SURGERY    . TONSILLECTOMY     Family History: No family history on file. Family Psychiatric  History: None known Social History:  Social History   Substance and Sexual Activity  Alcohol Use Not Currently     Social History   Substance and Sexual Activity  Drug Use Not Currently    Social History   Socioeconomic History  . Marital status: Single    Spouse name: Not on file  . Number of children: Not on file  . Years of education: Not on file  . Highest education level: Not on file  Occupational History  . Not on file  Social Needs  . Financial resource strain: Not on file  . Food insecurity:    Worry: Not on file    Inability: Not on file  . Transportation needs:    Medical: Not on file    Non-medical: Not on file  Tobacco  Use  . Smoking status: Former Smoker  . Smokeless tobacco: Never Used  Substance and Sexual Activity  . Alcohol use: Not Currently  . Drug use: Not Currently  . Sexual activity: Not Currently  Lifestyle  . Physical activity:    Days per week: Not on file    Minutes per session: Not on file  . Stress: Not on file  Relationships  . Social connections:    Talks on phone: Never    Gets together: Never    Attends religious service: Never    Active member of club or organization: No    Attends meetings of clubs or organizations: Never    Relationship status: Never married  Other Topics Concern  . Not on file   Social History Narrative  . Not on file   Additional Social History:    Allergies:   Allergies  Allergen Reactions  . Keflex [Cephalexin] Rash    Labs:  Results for orders placed or performed during the hospital encounter of 09/06/18 (from the past 48 hour(s))  Comprehensive metabolic panel     Status: Abnormal   Collection Time: 09/06/18 10:59 PM  Result Value Ref Range   Sodium 138 135 - 145 mmol/L   Potassium 3.0 (L) 3.5 - 5.1 mmol/L   Chloride 94 (L) 98 - 111 mmol/L   CO2 31 22 - 32 mmol/L   Glucose, Bld 84 70 - 99 mg/dL   BUN 19 8 - 23 mg/dL   Creatinine, Ser 1.20 0.61 - 1.24 mg/dL   Calcium 9.1 8.9 - 10.3 mg/dL   Total Protein 7.4 6.5 - 8.1 g/dL   Albumin 4.0 3.5 - 5.0 g/dL   AST 18 15 - 41 U/L   ALT 15 0 - 44 U/L   Alkaline Phosphatase 58 38 - 126 U/L   Total Bilirubin 0.5 0.3 - 1.2 mg/dL   GFR calc non Af Amer >60 >60 mL/min   GFR calc Af Amer >60 >60 mL/min    Comment: (NOTE) The eGFR has been calculated using the CKD EPI equation. This calculation has not been validated in all clinical situations. eGFR's persistently <60 mL/min signify possible Chronic Kidney Disease.    Anion gap 13 5 - 15    Comment: Performed at [facility], [address] [place], [place] [address]  Ethanol     Status: None   Collection Time: 09/06/18 10:59 PM  Result Value Ref Range   Alcohol, Ethyl (B) <10 <10 mg/dL    Comment: (NOTE) Lowest detectable limit for serum alcohol is 10 mg/dL. For medical purposes only. Performed at [facility], [address] [place], [place] [address]   Salicylate level     Status: None   Collection Time: 09/06/18 10:59 PM  Result Value Ref Range   Salicylate Lvl <7.5 2.8 - 30.0 mg/dL    Comment: Performed at [facility], [address] [place], [place] [address]  Acetaminophen level     Status: Abnormal   Collection Time: 09/06/18 10:59 PM  Result Value Ref Range   Acetaminophen (Tylenol), Serum <10 (L) 10  - 30 ug/mL    Comment: (NOTE) Therapeutic concentrations vary significantly. A range of 10-30 ug/mL  may be an effective concentration for many patients. However, some  are best treated at concentrations outside of this range. Acetaminophen concentrations >150 ug/mL at 4 hours after ingestion  and >50 ug/mL at 12 hours after ingestion are often associated with  toxic reactions. Performed at [facility], [address] [place], [place] [address]   cbc     Status: Abnormal   Collection Time: 09/06/18 10:59 PM  Result Value Ref Range   WBC 8.8 4.0 - 10.5 K/uL   RBC 4.35 4.22 - 5.81 MIL/uL   Hemoglobin 12.1 (L) 13.0 - 17.0 g/dL   HCT 36.3 (L) 39.0 - 52.0 %   MCV 83.4 80.0 - 100.0 fL   MCH 27.8 26.0 - 34.0 pg   MCHC 33.3 30.0 - 36.0 g/dL   RDW 14.7 11.5 - 15.5 %   Platelets 268 150 - 400 K/uL   nRBC 0.0 0.0 - 0.2 %    Comment: Performed at [facility], [address] [place], [place] [address]  Urine Drug Screen, Qualitative     Status: None   Collection Time: 09/06/18 11:52 PM  Result Value Ref Range   Tricyclic, Ur Screen NONE DETECTED NONE DETECTED   Amphetamines, Ur Screen NONE DETECTED NONE DETECTED   MDMA (Ecstasy)Ur Screen NONE DETECTED NONE DETECTED   Cocaine Metabolite,Ur [place] NONE DETECTED NONE DETECTED   Opiate, Ur Screen NONE DETECTED NONE DETECTED   Phencyclidine (PCP) Ur S NONE DETECTED NONE DETECTED   Cannabinoid 50 Ng, Ur [place] NONE DETECTED NONE DETECTED   Barbiturates, Ur Screen NONE DETECTED NONE DETECTED   Benzodiazepine, Ur Scrn NONE DETECTED NONE DETECTED   Methadone Scn, Ur NONE DETECTED NONE DETECTED    Comment: (NOTE) Tricyclics + metabolites, urine    Cutoff 1000 ng/mL Amphetamines + metabolites, urine  Cutoff 1000 ng/mL MDMA (Ecstasy), urine              Cutoff 500 ng/mL Cocaine Metabolite, urine          Cutoff 300 ng/mL Opiate + metabolites, urine        Cutoff 300 ng/mL Phencyclidine (PCP), urine         Cutoff 25  ng/mL Cannabinoid, urine                 Cutoff 50 ng/mL Barbiturates + metabolites, urine  Cutoff 200 ng/mL Benzodiazepine, urine              Cutoff 200 ng/mL Methadone, urine                   Cutoff 300 ng/mL The urine drug screen provides only a preliminary, unconfirmed analytical test result and should not be used for non-medical purposes. Clinical consideration and professional judgment should be applied to any positive drug screen result due to possible interfering substances. A more specific alternate chemical method must be used in order to obtain a confirmed analytical result. Gas chromatography / mass spectrometry (GC/MS) is the preferred confirmat ory method. Performed at [facility], [address] [place], [place] [address]     No current facility-administered medications for this encounter.    Current Outpatient Medications  Medication Sig Dispense Refill  . acetaminophen (TYLENOL) 500 MG tablet Take 500 mg by mouth every 4 (four) hours as needed for mild pain or fever.     Marland Kitchen alum & mag hydroxide-simeth (MINTOX REGULAR STRENGTH) 200-200-20 MG/5ML suspension Take 15-30 mLs by mouth as needed for indigestion or heartburn.    Marland Kitchen amLODipine (NORVASC) 10 MG tablet Take 10 mg by mouth daily.    Marland Kitchen aspirin EC 325 MG tablet Take 325 mg by mouth daily.    Marland Kitchen atorvastatin (LIPITOR) 80 MG tablet Take 80 mg by mouth at bedtime.     Marland Kitchen  benztropine (COGENTIN) 0.5 MG tablet Take 0.5 mg by mouth 2 (two) times daily.    . carvedilol (COREG) 6.25 MG tablet Take 6.25 mg by mouth 2 (two) times daily.     . clopidogrel (PLAVIX) 75 MG tablet Take 75 mg by mouth daily.    . divalproex (DEPAKOTE ER) 500 MG 24 hr tablet Take 1,000 mg by mouth daily. At 1600    . furosemide (LASIX) 40 MG tablet Take 1 tablet (40 mg total) by mouth 2 (two) times daily. 60 tablet 2  . gabapentin (NEURONTIN) 300 MG capsule Take 300 mg by mouth 3 (three) times daily.    Marland Kitchen glipiZIDE (GLUCOTROL) 5 MG tablet  Take 5 mg by mouth daily.     Marland Kitchen guaiFENesin (ROBITUSSIN) 100 MG/5ML SOLN Take 10 mLs by mouth every 6 (six) hours as needed for cough.     . insulin lispro (HUMALOG) 100 UNIT/ML injection Inject 57 Units into the skin 3 (three) times daily before meals.    . isosorbide mononitrate (IMDUR) 60 MG 24 hr tablet Take 60 mg by mouth daily.    Marland Kitchen loperamide (IMODIUM) 2 MG capsule Take 2 mg by mouth daily as needed for diarrhea or loose stools.     . magnesium hydroxide (MILK OF MAGNESIA) 400 MG/5ML suspension Take 30 mLs by mouth at bedtime as needed for mild constipation.     . metFORMIN (GLUCOPHAGE) 1000 MG tablet Take 1,000 mg by mouth 2 (two) times daily.     . metolazone (ZAROXOLYN) 2.5 MG tablet Take 2.5 mg by mouth daily.    Marland Kitchen neomycin-bacitracin-polymyxin (NEOSPORIN) [phone] ointment Apply 1 application topically as needed (for minor cuts and abrasions).     . nitroGLYCERIN (NITROSTAT) 0.4 MG SL tablet Place 0.4 mg under the tongue every 5 (five) minutes as needed for chest pain.    . Pimozide 1 MG TABS Take 1 mg by mouth 2 (two) times daily.    . potassium chloride 20 MEQ TBCR Take 20 mEq by mouth 2 (two) times daily. 60 tablet 2  . risperiDONE (RISPERDAL) 1 MG tablet Take 1.5 mg by mouth 2 (two) times daily.     . tamsulosin (FLOMAX) 0.4 MG CAPS capsule Take 0.4 mg by mouth daily.     . Vitamin D, Ergocalciferol, (DRISDOL) 50000 units CAPS capsule Take 50,000 Units by mouth every 7 (seven) days.    Marland Kitchen ezetimibe (ZETIA) 10 MG tablet Take 10 mg by mouth daily.    . insulin glargine (LANTUS) 100 UNIT/ML injection Inject 28-100 Units into the skin See admin instructions. 28 units every morning and 100 units every evening    . LORazepam (ATIVAN) 0.5 MG tablet Take 0.5 mg by mouth 3 (three) times daily as needed for anxiety.     . meclizine (ANTIVERT) 25 MG tablet Take 1 tablet (25 mg total) by mouth 2 (two) times daily. 30 tablet 0    Musculoskeletal: Strength & Muscle Tone: within normal  limits Gait & Station: normal Patient leans: N/A  Psychiatric Specialty Exam: Physical Exam  Nursing note and vitals reviewed. Constitutional: He appears well-developed and well-nourished.  HENT:  Head: Normocephalic and atraumatic.  Eyes: Pupils are equal, round, and reactive to light. Conjunctivae are normal.  Neck: Normal range of motion.  Cardiovascular: Regular rhythm and normal heart sounds.  Respiratory: Effort normal. No respiratory distress.  GI: Soft.  Musculoskeletal: Normal range of motion.  Neurological: He is alert.  Skin: Skin is warm and dry.  Psychiatric: His  affect is blunt. His speech is delayed. He is slowed. Thought content is not paranoid. Cognition and memory are impaired. He expresses impulsivity. He expresses no homicidal and no suicidal ideation.    Review of Systems  Constitutional: Negative.   HENT: Negative.   Eyes: Negative.   Respiratory: Negative.   Cardiovascular: Negative.   Gastrointestinal: Negative.   Musculoskeletal: Negative.   Skin: Negative.   Neurological: Negative.   Psychiatric/Behavioral: Negative.     Height 6' (1.829 m), weight 134.8 kg.Body mass index is 40.3 kg/m.  General Appearance: Fairly Groomed  Eye Contact:  Fair  Speech:  Clear and Coherent  Volume:  Decreased  Mood:  Anxious  Affect:  Blunt  Thought Process:  Goal Directed  Orientation:  Full (Time, Place, and Person)  Thought Content:  Logical  Suicidal Thoughts:  No  Homicidal Thoughts:  No  Memory:  Immediate;   Fair Recent;   Fair Remote;   Fair  Judgement:  Fair  Insight:  Fair  Psychomotor Activity:  Decreased  Concentration:  Concentration: Fair  Recall:  [employer] of Knowledge:  Fair  Language:  Fair  Akathisia:  No  Handed:  Right  AIMS (if indicated):     Assets:  Desire for Improvement  ADL's:  Intact  Cognition:  Impaired,  Mild  Sleep:        Treatment Plan Summary: Plan Patient is at his baseline in terms of mental state and does  not require inpatient hospitalization.  Does not meet commitment criteria.  No sign of his being acutely dangerous to himself or anyone else.  His behavior issues are not the result of a mental illness but are purely intended to manipulate the situation for secondary gain.  No medication or treatment is likely to make any change to this.  Patient admits as much during the interview.  Patient can be discharged back to his group home as there is no further need for hospital level treatment.  Disposition: No evidence of imminent risk to self or others at present.   Patient does not meet criteria for psychiatric inpatient admission.  Alethia Berthold, MD 09/07/2018 4:16 PM

## 2018-09-07 NOTE — ED Notes (Signed)
Pt made aware he needs to speak with psychiatry to determine his disposition. Pt unhappy, but accepting. Pt has spoken with his legal guardian.   Maintained on 15 minute checks and observation by security camera for safety.

## 2018-09-07 NOTE — ED Provider Notes (Signed)
 -----------------------------------------   1:47 PM on 09/07/2018 -----------------------------------------  Discussed with Dr. Toni Amend after his evaluation of the patient.  Not a danger to himself or others, psychiatrically stable.  Medically stable.  Suitable for discharge home to follow-up with outpatient providers.   Sharman Cheek, MD 09/07/18 1348

## 2018-09-07 NOTE — ED Notes (Signed)
Patient's legal guardian Jason [name] returned phone call.  Pt may return to group home via [employer] cab.  TTS also aware.

## 2018-09-07 NOTE — ED Provider Notes (Signed)
[facility] Emergency Department Provider Note   First MD Initiated Contact with Patient 09/07/18 [phone]     (approximate)  I have reviewed the triage vital signs and the nursing notes.   HISTORY  Chief Complaint Psychiatric Evaluation   HPI Jason [name] is a 67 y.o. male with below list of chronic medical conditions including paranoid schizophrenia and intellectual disability presents to the emergency department with "I hate my group home".  Patient states that if he returns to his group home that "I will kill myself if I go back there".   Past Medical History:  Diagnosis Date  . Diabetes mellitus without complication ([facility])   . GERD (gastroesophageal reflux disease)   . Hypertension   . Paranoid schizophrenia ([facility])   . Tourette disorder     Patient Active Problem List   Diagnosis Date Noted  . Dizziness 08/25/2018  . Hypokalemia due to inadequate potassium intake 08/16/2018  . Hypertension 08/07/2018  . Malingering 08/07/2018  . Intellectual disability 08/07/2018  . Paranoid schizophrenia ([facility]) 07/23/2018  . Diabetes mellitus without complication ([facility]) 07/23/2018  . Tourette disorder 07/23/2018    Past Surgical History:  Procedure Laterality Date  . CARDIAC SURGERY    . HAND SURGERY    . TONSILLECTOMY      Prior to Admission medications   Medication Sig Start Date End Date Taking? Authorizing Provider  acetaminophen (TYLENOL) 500 MG tablet Take 500 mg by mouth every 4 (four) hours as needed for mild pain or fever.    Yes [provider]  alum & mag hydroxide-simeth (MINTOX REGULAR STRENGTH) 200-200-20 MG/5ML suspension Take 15-30 mLs by mouth as needed for indigestion or heartburn.   Yes [provider]  amLODipine (NORVASC) 10 MG tablet Take 10 mg by mouth daily.   Yes [provider]  aspirin EC 325 MG tablet Take 325 mg by mouth daily.   Yes [provider]  atorvastatin (LIPITOR) 80 MG tablet Take  80 mg by mouth at bedtime.    Yes [provider]  benztropine (COGENTIN) 0.5 MG tablet Take 0.5 mg by mouth 2 (two) times daily.   Yes [provider]  carvedilol (COREG) 6.25 MG tablet Take 6.25 mg by mouth 2 (two) times daily.    Yes [provider]  clopidogrel (PLAVIX) 75 MG tablet Take 75 mg by mouth daily.   Yes [provider]  divalproex (DEPAKOTE ER) 500 MG 24 hr tablet Take 1,000 mg by mouth daily. At 1600   Yes [provider]  furosemide (LASIX) 40 MG tablet Take 1 tablet (40 mg total) by mouth 2 (two) times daily. 08/17/18  Yes Enid Baas, MD  gabapentin (NEURONTIN) 300 MG capsule Take 300 mg by mouth 3 (three) times daily.   Yes [provider]  glipiZIDE (GLUCOTROL) 5 MG tablet Take 5 mg by mouth daily.    Yes [provider]  guaiFENesin (ROBITUSSIN) 100 MG/5ML SOLN Take 10 mLs by mouth every 6 (six) hours as needed for cough.    Yes [provider]  insulin lispro (HUMALOG) 100 UNIT/ML injection Inject 57 Units into the skin 3 (three) times daily before meals.   Yes [provider]  isosorbide mononitrate (IMDUR) 60 MG 24 hr tablet Take 60 mg by mouth daily.   Yes [provider]  loperamide (IMODIUM) 2 MG capsule Take 2 mg by mouth daily as needed for diarrhea or loose stools.    Yes [provider]  magnesium hydroxide (MILK OF MAGNESIA) 400 MG/5ML suspension Take 30 mLs by mouth at bedtime as needed for mild constipation.    Yes [provider]  metFORMIN (GLUCOPHAGE) 1000 MG tablet Take 1,000 mg by mouth 2 (two) times daily.    Yes [provider]  metolazone (ZAROXOLYN) 2.5 MG tablet Take 2.5 mg by mouth daily.   Yes [provider]  neomycin-bacitracin-polymyxin (NEOSPORIN) [phone] ointment Apply 1 application topically as needed (for minor cuts and abrasions).    Yes [provider]  nitroGLYCERIN (NITROSTAT) 0.4 MG SL tablet Place  0.4 mg under the tongue every 5 (five) minutes as needed for chest pain.   Yes [provider]  Pimozide 1 MG TABS Take 1 mg by mouth 2 (two) times daily.   Yes [provider]  potassium chloride 20 MEQ TBCR Take 20 mEq by mouth 2 (two) times daily. 08/17/18  Yes Enid Baas, MD  risperiDONE (RISPERDAL) 1 MG tablet Take 1.5 mg by mouth 2 (two) times daily.    Yes [provider]  tamsulosin (FLOMAX) 0.4 MG CAPS capsule Take 0.4 mg by mouth daily.    Yes [provider]  Vitamin D, Ergocalciferol, (DRISDOL) 50000 units CAPS capsule Take 50,000 Units by mouth every 7 (seven) days.   Yes [provider]  ezetimibe (ZETIA) 10 MG tablet Take 10 mg by mouth daily. 08/24/18   [provider]  insulin glargine (LANTUS) 100 UNIT/ML injection Inject 28-100 Units into the skin See admin instructions. 28 units every morning and 100 units every evening    [provider]  LORazepam (ATIVAN) 0.5 MG tablet Take 0.5 mg by mouth 3 (three) times daily as needed for anxiety.     [provider]  meclizine (ANTIVERT) 25 MG tablet Take 1 tablet (25 mg total) by mouth 2 (two) times daily. 08/26/18   Shaune Pollack, MD    Allergies Keflex [cephalexin]  No family history on file.  Social History Social History   Tobacco Use  . Smoking status: Former Games developer  . Smokeless tobacco: Never Used  Substance Use Topics  . Alcohol use: Not Currently  . Drug use: Not Currently    Review of Systems Constitutional: No fever/chills Eyes: No visual changes. ENT: No sore throat. Cardiovascular: Denies chest pain. Respiratory: Denies shortness of breath. Gastrointestinal: No abdominal pain.  No nausea, no vomiting.  No diarrhea.  No constipation. Genitourinary: Negative for dysuria. Musculoskeletal: Negative for neck pain.  Negative for back pain. Integumentary: Negative for rash. Neurological: Negative for headaches, focal weakness or  numbness. Psychiatric:Positive for suicidal ideation  ____________________________________________   PHYSICAL EXAM:  VITAL SIGNS: ED Triage Vitals [09/06/18 2255]  Enc Vitals Group     BP      Pulse      Resp      Temp      Temp src      SpO2      Weight 134.8 kg (297 lb 2.9 oz)     Height 1.829 m (6')     Head Circumference      Peak Flow      Pain Score 10     Pain Loc      Pain Edu?      Excl. in GC?     Constitutional: Alert and oriented. Well appearing and in no acute distress. Eyes: Conjunctivae are normal. Head: Atraumatic. Mouth/Throat: Mucous membranes are moist. Oropharynx non-erythematous. Neck: No stridor.   Cardiovascular: Normal rate, regular rhythm.  Good peripheral circulation. Grossly normal heart sounds. Respiratory: Normal respiratory effort.  No retractions. Lungs CTAB. Gastrointestinal: Soft and nontender. No distention.  Musculoskeletal: No lower extremity tenderness nor edema. No gross deformities of extremities. Neurologic:  Normal speech and language. No gross focal neurologic deficits are appreciated.  Skin:  Skin is warm, dry and intact. No rash noted. Psychiatric: Mood and affect are normal. Speech and behavior are normal.  ____________________________________________   LABS (all labs ordered are listed, but only abnormal results are displayed)  Labs Reviewed  COMPREHENSIVE METABOLIC PANEL - Abnormal; Notable for the following components:      Result Value   Potassium 3.0 (*)    Chloride 94 (*)    All other components within normal limits  ACETAMINOPHEN LEVEL - Abnormal; Notable for the following components:   Acetaminophen (Tylenol), Serum <10 (*)    All other components within normal limits  CBC - Abnormal; Notable for the following components:   Hemoglobin 12.1 (*)    HCT 36.3 (*)    All other components within normal limits  ETHANOL  SALICYLATE LEVEL  URINE DRUG SCREEN, QUALITATIVE ([facility] ONLY)       Procedures   ____________________________________________   INITIAL IMPRESSION / ASSESSMENT AND PLAN / ED COURSE  As part of my medical decision making, I reviewed the following data within the electronic MEDICAL RECORD NUMBER   67 year old male present with above-stated history and physical exam with stated suicidal ideation and as such tragic consultation ordered.  ____________________________________________  FINAL CLINICAL IMPRESSION(S) / ED DIAGNOSES  Final diagnoses:  Suicidal ideation     MEDICATIONS GIVEN DURING THIS VISIT:  Medications - No data to display   ED Discharge Orders    None       Note:  This document was prepared using Dragon voice recognition software and may include unintentional dictation errors.    Darci Current, MD 09/07/18 [phone]

## 2018-09-07 NOTE — BH Assessment (Signed)
This Clinical research associate called [facility] (pt's group home) - [phone] and spoke with Mrs. Richmond (group home staff). She states [place] taxi service will pick up patient and take patient back to [facility].   This information was relayed to pt's nurse and ED secretary.

## 2018-09-07 NOTE — ED Notes (Signed)
Dr.Clapacs at bedside  

## 2018-09-08 ENCOUNTER — Other Ambulatory Visit: Payer: Self-pay

## 2018-09-08 ENCOUNTER — Emergency Department
Admission: EM | Admit: 2018-09-08 | Discharge: 2018-09-09 | Disposition: A | Discharge status: Home / Self Care | Payer: Medicare Other | Attending: Emergency Medicine | Admitting: Emergency Medicine

## 2018-09-08 DIAGNOSIS — E119 Type 2 diabetes mellitus without complications: Secondary | ICD-10-CM | POA: Diagnosis not present

## 2018-09-08 DIAGNOSIS — Z7982 Long term (current) use of aspirin: Secondary | ICD-10-CM | POA: Insufficient documentation

## 2018-09-08 DIAGNOSIS — Z7902 Long term (current) use of antithrombotics/antiplatelets: Secondary | ICD-10-CM | POA: Insufficient documentation

## 2018-09-08 DIAGNOSIS — E079 Disorder of thyroid, unspecified: Secondary | ICD-10-CM

## 2018-09-08 DIAGNOSIS — I10 Essential (primary) hypertension: Secondary | ICD-10-CM | POA: Insufficient documentation

## 2018-09-08 DIAGNOSIS — R451 Restlessness and agitation: Secondary | ICD-10-CM | POA: Diagnosis not present

## 2018-09-08 DIAGNOSIS — Z765 Malingerer [conscious simulation]: Secondary | ICD-10-CM

## 2018-09-08 DIAGNOSIS — Z794 Long term (current) use of insulin: Secondary | ICD-10-CM | POA: Diagnosis not present

## 2018-09-08 DIAGNOSIS — Z79899 Other long term (current) drug therapy: Secondary | ICD-10-CM | POA: Diagnosis not present

## 2018-09-08 DIAGNOSIS — F200 Paranoid schizophrenia: Secondary | ICD-10-CM | POA: Diagnosis present

## 2018-09-08 DIAGNOSIS — Z87891 Personal history of nicotine dependence: Secondary | ICD-10-CM | POA: Insufficient documentation

## 2018-09-08 DIAGNOSIS — F79 Unspecified intellectual disabilities: Secondary | ICD-10-CM

## 2018-09-08 MED ORDER — LORAZEPAM 2 MG PO TABS
ORAL_TABLET | ORAL | Status: AC
  Filled 2018-09-08: qty 1

## 2018-09-08 MED ORDER — LORAZEPAM 2 MG PO TABS
2.0000 mg | ORAL_TABLET | Freq: Once | ORAL | Status: AC
  Administered 2018-09-08: 2 mg via ORAL

## 2018-09-08 MED ORDER — ZIPRASIDONE MESYLATE 20 MG IM SOLR
INTRAMUSCULAR | Status: AC
  Filled 2018-09-08: qty 20

## 2018-09-08 MED ORDER — ZIPRASIDONE MESYLATE 20 MG IM SOLR
20.0000 mg | Freq: Once | INTRAMUSCULAR | Status: AC
  Administered 2018-09-08: 20 mg via INTRAMUSCULAR

## 2018-09-08 MED ORDER — LORAZEPAM 2 MG PO TABS: 2.0000 mg | ORAL_TABLET | Freq: Once | ORAL | Status: DC

## 2018-09-08 NOTE — ED Notes (Signed)
Q15 minute rounding sheet started on patient. Patient received diet cola and warm blanket per request.Patient is currently laying in bed. Bed locked in lowest position and environment secured. Will continue to monitor.

## 2018-09-08 NOTE — ED Notes (Signed)
ED  Is the patient under IVC or is there intent for IVC: Yes.   Is the patient medically cleared: Yes.   Is there vacancy in the ED BHU: Yes.   Is the population mix appropriate for patient: Yes.   Is the patient awaiting placement in inpatient or outpatient setting:  Has the patient had a psychiatric consult:  Consult pending Survey of unit performed for contraband, proper placement and condition of furniture, tampering with fixtures in bathroom, shower, and each patient room: Yes.  ; Findings:  APPEARANCE/BEHAVIOR  cooperative - angry - hostile NEURO ASSESSMENT Orientation: alert andoriented x3  Denies pain Hallucinations: No.None noted (Hallucinations) denies  Speech: Normal - loud at times  Gait: normal RESPIRATORY ASSESSMENT Even  Unlabored respirations  CARDIOVASCULAR ASSESSMENT Pulses equal   regular rate  Skin warm and dry   GASTROINTESTINAL ASSESSMENT no GI complaint EXTREMITIES Full ROM  PLAN OF CARE Provide calm/safe environment. Vital signs assessed twice daily. ED BHU Assessment once each 12-hour shift. Collaborate with intake TTS or as condition indicates. Assure the ED provider has rounded once each shift. Provide and encourage hygiene. Provide redirection as needed. Assess for escalating behavior; address immediately and inform ED provider.  Assess family dynamic and appropriateness for visitation as needed: Yes.  ; If necessary, describe findings:  Educate the patient/family about BHU procedures/visitation: Yes.  ; If necessary, describe findings:

## 2018-09-08 NOTE — ED Notes (Signed)
BEHAVIORAL HEALTH ROUNDING Patient sleeping: No. Patient alert and oriented: yes Behavior appropriate: Yes.  ; If no, describe:  Nutrition and fluids offered: yes Toileting and hygiene offered: Yes  Sitter present: q15 minute observations and security  monitoring Law enforcement present: Yes  ODS  

## 2018-09-08 NOTE — ED Notes (Signed)
MD Clapacs went into the room to consult with the pt  - the pt began screaming at the doctor - screaming  "I am not going back to that group home -  I am not going to talk to you - You better get out of here"  MD left the room  Pt to the door screaming   Med to be administered

## 2018-09-08 NOTE — BH Assessment (Signed)
Writer called and left a HIPPA Compliant message with [facility] DSS Guardian [facility] [phone]), requesting a return phone call.

## 2018-09-08 NOTE — ED Notes (Signed)
Sat the patients lunch tray by the sink. The patient is sleeping.

## 2018-09-08 NOTE — BH Assessment (Signed)
Late Entry  After seeing the patient for the TTS Consult, writer spoke with Psych MD (Dr. Clapacs) about the patient's presenting problem, symptoms and risk factors. Psych MD states he will see the patient. Writer updated ER MD (Dr. Lord). 

## 2018-09-08 NOTE — Consult Note (Signed)
[place] Psychiatry Consult   Reason for Consult: Consult for this 67 year old man who was just released from the emergency room yesterday and came back within a matter of hours Referring Physician: Siadecki Patient Identification: Jason [name] MRN:  [id number] Principal Diagnosis: Malingering Diagnosis:   Patient Active Problem List   Diagnosis Date Noted  . Dizziness [R42] 08/25/2018  . Hypokalemia due to inadequate potassium intake [E87.6] 08/16/2018  . Hypertension [I10] 08/07/2018  . Malingering [Z76.5] 08/07/2018  . Intellectual disability [F79] 08/07/2018  . Paranoid schizophrenia ([place]) [F20.0] 07/23/2018  . Diabetes mellitus without complication ([place]) [C16.6] 07/23/2018  . Tourette disorder [F95.2] 07/23/2018    Total Time spent with patient: 1 hour  Subjective:   Jason [name] is a 67 y.o. male patient admitted with "do not talk to me".  HPI: Patient seen chart reviewed.  Patient known from several previous encounters.  Patient was discharged from the emergency room yesterday back to his group home.  Exact circumstances are unclear but it seems that he either called 911 himself or once again walked away from the facility so that he could come to the emergency room.  I attempted to interview the patient today.  When he saw me he shouted at me that he did not want to speak to me because he knows that I had suggested discharge yesterday.  He yells multiple times that he will not go back to his group home but will not engage in any other conversation.  Social history: Patient has a legal guardian.  He is currently residing in a group home locally.  He does not appear to like it very much.  Medical history: Diabetes overweight has some dusky erythema on both of his lower extremities that seems pretty stable.  Substance abuse history: None  Past Psychiatric History: Diagnosis on the chart of schizophrenia although I have not seen the patient actually be psychotic.   Intellectual disability.  Looking back in the old chart it seems there is been a long history of behavioral problems to manipulate group home choice.  Risk to Self: Suicidal Ideation: No Suicidal Intent: No Is patient at risk for suicide?: No Suicidal Plan?: No Access to Means: No What has been your use of drugs/alcohol within the last 12 months?: Reports of none How many times?: 0 Other Self Harm Risks: Reports of none Triggers for Past Attempts: None known Intentional Self Injurious Behavior: None Risk to Others: Homicidal Ideation: No Thoughts of Harm to Others: No Current Homicidal Intent: No Current Homicidal Plan: No Access to Homicidal Means: No Identified Victim: Reports of none History of harm to others?: No Assessment of Violence: None Noted Violent Behavior Description: Reports of none Does patient have access to weapons?: No Criminal Charges Pending?: No Does patient have a court date: No Prior Inpatient Therapy: Prior Inpatient Therapy: No Prior Outpatient Therapy: Prior Outpatient Therapy: No Does patient have an ACCT team?: No Does patient have Intensive In-House Services?  : No Does patient have [facility] services? : No Does patient have P4CC services?: No  Past Medical History:  Past Medical History:  Diagnosis Date  . Diabetes mellitus without complication ([place])   . GERD (gastroesophageal reflux disease)   . Hypertension   . Paranoid schizophrenia ([place])   . Tourette disorder     Past Surgical History:  Procedure Laterality Date  . CARDIAC SURGERY    . HAND SURGERY    . TONSILLECTOMY     Family History: History reviewed. No pertinent  family history. Family Psychiatric  History: Unknown Social History:  Social History   Substance and Sexual Activity  Alcohol Use Not Currently     Social History   Substance and Sexual Activity  Drug Use Not Currently    Social History   Socioeconomic History  . Marital status: Single    Spouse name: Not on  file  . Number of children: Not on file  . Years of education: Not on file  . Highest education level: Not on file  Occupational History  . Not on file  Social Needs  . Financial resource strain: Not on file  . Food insecurity:    Worry: Not on file    Inability: Not on file  . Transportation needs:    Medical: Not on file    Non-medical: Not on file  Tobacco Use  . Smoking status: Former Research scientist (life sciences)  . Smokeless tobacco: Never Used  Substance and Sexual Activity  . Alcohol use: Not Currently  . Drug use: Not Currently  . Sexual activity: Not Currently  Lifestyle  . Physical activity:    Days per week: Not on file    Minutes per session: Not on file  . Stress: Not on file  Relationships  . Social connections:    Talks on phone: Never    Gets together: Never    Attends religious service: Never    Active member of club or organization: No    Attends meetings of clubs or organizations: Never    Relationship status: Never married  Other Topics Concern  . Not on file  Social History Narrative  . Not on file   Additional Social History:    Allergies:   Allergies  Allergen Reactions  . Keflex [Cephalexin] Rash    Labs:  Results for orders placed or performed during the hospital encounter of 09/06/18 (from the past 48 hour(s))  Comprehensive metabolic panel     Status: Abnormal   Collection Time: 09/06/18 10:59 PM  Result Value Ref Range   Sodium 138 135 - 145 mmol/L   Potassium 3.0 (L) 3.5 - 5.1 mmol/L   Chloride 94 (L) 98 - 111 mmol/L   CO2 31 22 - 32 mmol/L   Glucose, Bld 84 70 - 99 mg/dL   BUN 19 8 - 23 mg/dL   Creatinine, Ser 1.20 0.61 - 1.24 mg/dL   Calcium 9.1 8.9 - 10.3 mg/dL   Total Protein 7.4 6.5 - 8.1 g/dL   Albumin 4.0 3.5 - 5.0 g/dL   AST 18 15 - 41 U/L   ALT 15 0 - 44 U/L   Alkaline Phosphatase 58 38 - 126 U/L   Total Bilirubin 0.5 0.3 - 1.2 mg/dL   GFR calc non Af Amer >60 >60 mL/min   GFR calc Af Amer >60 >60 mL/min    Comment: (NOTE) The eGFR  has been calculated using the CKD EPI equation. This calculation has not been validated in all clinical situations. eGFR's persistently <60 mL/min signify possible Chronic Kidney Disease.    Anion gap 13 5 - 15    Comment: Performed at [facility], [address] [place], [place] [address]  Ethanol     Status: None   Collection Time: 09/06/18 10:59 PM  Result Value Ref Range   Alcohol, Ethyl (B) <10 <10 mg/dL    Comment: (NOTE) Lowest detectable limit for serum alcohol is 10 mg/dL. For medical purposes only. Performed at [facility], [place], [place]  [address]   Salicylate level     Status: None   Collection Time: 09/06/18 10:59 PM  Result Value Ref Range   Salicylate Lvl <4.9 2.8 - 30.0 mg/dL    Comment: Performed at [facility], [address] [place], [place] [address]  Acetaminophen level     Status: Abnormal   Collection Time: 09/06/18 10:59 PM  Result Value Ref Range   Acetaminophen (Tylenol), Serum <10 (L) 10 - 30 ug/mL    Comment: (NOTE) Therapeutic concentrations vary significantly. A range of 10-30 ug/mL  may be an effective concentration for many patients. However, some  are best treated at concentrations outside of this range. Acetaminophen concentrations >150 ug/mL at 4 hours after ingestion  and >50 ug/mL at 12 hours after ingestion are often associated with  toxic reactions. Performed at [facility], [address] [place], [place] [address]   cbc     Status: Abnormal   Collection Time: 09/06/18 10:59 PM  Result Value Ref Range   WBC 8.8 4.0 - 10.5 K/uL   RBC 4.35 4.22 - 5.81 MIL/uL   Hemoglobin 12.1 (L) 13.0 - 17.0 g/dL   HCT 36.3 (L) 39.0 - 52.0 %   MCV 83.4 80.0 - 100.0 fL   MCH 27.8 26.0 - 34.0 pg   MCHC 33.3 30.0 - 36.0 g/dL   RDW 14.7 11.5 - 15.5 %   Platelets 268 150 - 400 K/uL   nRBC 0.0 0.0 - 0.2 %    Comment: Performed at [facility], [address] [place], [place]  [address]  Urine Drug Screen, Qualitative     Status: None   Collection Time: 09/06/18 11:52 PM  Result Value Ref Range   Tricyclic, Ur Screen NONE DETECTED NONE DETECTED   Amphetamines, Ur Screen NONE DETECTED NONE DETECTED   MDMA (Ecstasy)Ur Screen NONE DETECTED NONE DETECTED   Cocaine Metabolite,Ur [place] NONE DETECTED NONE DETECTED   Opiate, Ur Screen NONE DETECTED NONE DETECTED   Phencyclidine (PCP) Ur S NONE DETECTED NONE DETECTED   Cannabinoid 50 Ng, Ur [place] NONE DETECTED NONE DETECTED   Barbiturates, Ur Screen NONE DETECTED NONE DETECTED   Benzodiazepine, Ur Scrn NONE DETECTED NONE DETECTED   Methadone Scn, Ur NONE DETECTED NONE DETECTED    Comment: (NOTE) Tricyclics + metabolites, urine    Cutoff 1000 ng/mL Amphetamines + metabolites, urine  Cutoff 1000 ng/mL MDMA (Ecstasy), urine              Cutoff 500 ng/mL Cocaine Metabolite, urine          Cutoff 300 ng/mL Opiate + metabolites, urine        Cutoff 300 ng/mL Phencyclidine (PCP), urine         Cutoff 25 ng/mL Cannabinoid, urine                 Cutoff 50 ng/mL Barbiturates + metabolites, urine  Cutoff 200 ng/mL Benzodiazepine, urine              Cutoff 200 ng/mL Methadone, urine                   Cutoff 300 ng/mL The urine drug screen provides only a preliminary, unconfirmed analytical test result and should not be used for non-medical purposes. Clinical consideration and professional judgment should be applied to any positive drug screen result due to possible interfering substances. A more specific alternate chemical method must be used in order to obtain a confirmed analytical result. Gas  chromatography / mass spectrometry (GC/MS) is the preferred confirmat ory method. Performed at [facility], [address] [place], [place] [address]     Current Facility-Administered Medications  Medication Dose Route Frequency Provider Last Rate Last Dose  . LORazepam (ATIVAN) tablet 2 mg  2 mg Oral Once Larayne Baxley, Madie Reno, MD        Current Outpatient Medications  Medication Sig Dispense Refill  . acetaminophen (TYLENOL) 500 MG tablet Take 500 mg by mouth every 4 (four) hours as needed for mild pain or fever.     Marland Kitchen alum & mag hydroxide-simeth (MINTOX REGULAR STRENGTH) 200-200-20 MG/5ML suspension Take 15-30 mLs by mouth as needed for indigestion or heartburn.    Marland Kitchen amLODipine (NORVASC) 10 MG tablet Take 10 mg by mouth daily.    Marland Kitchen aspirin EC 325 MG tablet Take 325 mg by mouth daily.    Marland Kitchen atorvastatin (LIPITOR) 80 MG tablet Take 80 mg by mouth at bedtime.     . benztropine (COGENTIN) 0.5 MG tablet Take 0.5 mg by mouth 2 (two) times daily.    . carvedilol (COREG) 6.25 MG tablet Take 6.25 mg by mouth 2 (two) times daily.     . clopidogrel (PLAVIX) 75 MG tablet Take 75 mg by mouth daily.    . divalproex (DEPAKOTE ER) 500 MG 24 hr tablet Take 1,000 mg by mouth daily. At 1600    . ezetimibe (ZETIA) 10 MG tablet Take 10 mg by mouth daily.    . furosemide (LASIX) 40 MG tablet Take 1 tablet (40 mg total) by mouth 2 (two) times daily. 60 tablet 2  . gabapentin (NEURONTIN) 300 MG capsule Take 300 mg by mouth 3 (three) times daily.    Marland Kitchen glipiZIDE (GLUCOTROL) 5 MG tablet Take 5 mg by mouth daily.     Marland Kitchen guaiFENesin (ROBITUSSIN) 100 MG/5ML SOLN Take 10 mLs by mouth every 6 (six) hours as needed for cough.     . insulin glargine (LANTUS) 100 UNIT/ML injection Inject 28-100 Units into the skin See admin instructions. 28 units every morning and 100 units every evening    . insulin lispro (HUMALOG) 100 UNIT/ML injection Inject 57 Units into the skin 3 (three) times daily before meals.    . isosorbide mononitrate (IMDUR) 60 MG 24 hr tablet Take 60 mg by mouth daily.    Marland Kitchen loperamide (IMODIUM) 2 MG capsule Take 2 mg by mouth daily as needed for diarrhea or loose stools.     Marland Kitchen LORazepam (ATIVAN) 0.5 MG tablet Take 0.5 mg by mouth 3 (three) times daily as needed for anxiety.     . magnesium hydroxide (MILK OF MAGNESIA) 400 MG/5ML suspension  Take 30 mLs by mouth at bedtime as needed for mild constipation.     . meclizine (ANTIVERT) 25 MG tablet Take 1 tablet (25 mg total) by mouth 2 (two) times daily. 30 tablet 0  . metFORMIN (GLUCOPHAGE) 1000 MG tablet Take 1,000 mg by mouth 2 (two) times daily.     . metolazone (ZAROXOLYN) 2.5 MG tablet Take 2.5 mg by mouth daily.    Marland Kitchen neomycin-bacitracin-polymyxin (NEOSPORIN) [phone] ointment Apply 1 application topically as needed (for minor cuts and abrasions).     . nitroGLYCERIN (NITROSTAT) 0.4 MG SL tablet Place 0.4 mg under the tongue every 5 (five) minutes as needed for chest pain.    . Pimozide 1 MG TABS Take 1 mg by mouth 2 (two) times daily.    . potassium chloride 20 MEQ TBCR  Take 20 mEq by mouth 2 (two) times daily. 60 tablet 2  . risperiDONE (RISPERDAL) 1 MG tablet Take 1.5 mg by mouth 2 (two) times daily.     . tamsulosin (FLOMAX) 0.4 MG CAPS capsule Take 0.4 mg by mouth daily.     . Vitamin D, Ergocalciferol, (DRISDOL) 50000 units CAPS capsule Take 50,000 Units by mouth every 7 (seven) days.      Musculoskeletal: Strength & Muscle Tone: within normal limits Gait & Station: normal Patient leans: N/A  Psychiatric Specialty Exam: Physical Exam  Nursing note and vitals reviewed. Constitutional: He appears well-developed and well-nourished.  HENT:  Head: Normocephalic and atraumatic.  Eyes: Pupils are equal, round, and reactive to light. Conjunctivae are normal.  Neck: Normal range of motion.  Cardiovascular: Regular rhythm and normal heart sounds.  Respiratory: Effort normal. No respiratory distress.  GI: Soft.  Musculoskeletal: Normal range of motion.  Neurological: He is alert.  Skin: Skin is warm and dry. There is erythema.  Psychiatric: His speech is normal. His affect is angry. He is agitated and aggressive. Thought content is not paranoid. Cognition and memory are impaired. He expresses impulsivity and inappropriate judgment. He expresses no homicidal and no  suicidal ideation.    Review of Systems  Unable to perform ROS: Other    Blood pressure (!) 154/89, pulse 78, temperature 98.2 F (36.8 C), resp. rate 16, SpO2 97 %.There is no height or weight on file to calculate BMI.  General Appearance: Casual  Eye Contact:  Minimal  Speech:  Normal Rate  Volume:  Decreased  Mood:  Angry  Affect:  Congruent  Thought Process:  Goal Directed  Orientation:  Full (Time, Place, and Person)  Thought Content:  Negative  Suicidal Thoughts:  No  Homicidal Thoughts:  No  Memory:  Negative  Judgement:  Negative  Insight:  Negative  Psychomotor Activity:  Negative  Concentration:  Concentration: Negative  Recall:  Negative  Fund of Knowledge:  Negative  Language:  Negative  Akathisia:  Negative  Handed:  Right  AIMS (if indicated):     Assets:  Social Support  ADL's:  Intact  Cognition:  Impaired,  Mild  Sleep:        Treatment Plan Summary: Plan 67 year old man with chronic disability who comes back to the emergency room under circumstances that make it clear that his only desire is to get away from his group home.  Everything about his behavior when he comes into the hospital has clearly been directed towards trying to manipulate his situation to where he will get away from his current group home.  He is complaints about it when he has chosen to discuss them have been trivial.  Patient is irritable and angry and argumentative this evening knowing that we are not planning to admit him to the psychiatry ward.  As I have said in previous notes I do not think that any of this behavior is related to a mental illness that would be amenable to any particular treatment.  I think that admission to the psychiatry ward would likely result in a long and fruitless use of resources.  Patient may be at increased risk for putting himself in a dangerous situation out of frustration but is not acutely suicidal or meeting commitment criteria.  Recommend that he be  discharged back to his group home.  Disposition: Patient does not meet criteria for psychiatric inpatient admission.  Alethia Berthold, MD 09/08/2018 8:21 PM

## 2018-09-08 NOTE — ED Notes (Signed)
Pt asleep, meal tray placed on pt bed.   

## 2018-09-08 NOTE — ED Notes (Signed)
Pt sitting in the hallway bed - he is talking loudly  "I am not going back to that group home - Y'all cannot make me.  They gave me a 30 day notice that is up next Tuesday  - I am not going back - it might as well be up today cause I cannot stay another minute there."  Pt reassured   MD to bedside   Med to be administered

## 2018-09-08 NOTE — ED Notes (Addendum)
Called legal guardian Zipporah Plants.  Did not leave a message.

## 2018-09-08 NOTE — ED Provider Notes (Addendum)
-----------------------------------------   5:14 PM on 09/08/2018 -----------------------------------------  I took over care of this patient from Dr. Shaune Pollack.  Patient has been evaluated by Dr. Toni Amend and is psychiatrically cleared for discharge.  He will return to the group home if the group home is willing to take him.  He is stable for discharge at this time.   ----------------------------------------- 5:18 PM on 09/08/2018 -----------------------------------------  After being told he was being discharged, the patient became increasingly agitated, requiring Geodon.  We will put the discharge on hold for now.   Dionne Bucy, MD 09/09/18 [phone]

## 2018-09-08 NOTE — Discharge Instructions (Addendum)
Return to the ER as needed for any emergent medical or behavior symptoms that concern you.

## 2018-09-08 NOTE — ED Notes (Signed)
Called the legal guardian again - I was not allowed to leave a message  [phone]

## 2018-09-08 NOTE — ED Notes (Signed)
Pt ambulating into the hallway  - plan of care discussed

## 2018-09-08 NOTE — ED Provider Notes (Signed)
[facility] Emergency Department Provider Note ____________________________________________   I have reviewed the triage vital signs and the triage nursing note.  HISTORY  Chief Complaint Psychiatric Evaluation   Historian Patient  HPI Jason [name] is a 67 y.o. male history by chart review of paranoid schizophrenia, intellectual disability, and malingering listed, presenting from a group home (has a legal guardian), this morning yelling "I am not going back to the group home!"      Patient has apparently been unhappy at his group home, he is unable to articulate what is bothering him at this time.  Review past notes, patient was recently in the ED yesterday and evaluated by TTS and Dr. Toni Amend, psychiatry and was not found to have emergency psychiatric need for hospitalization and discharged back to group home.  This morning patient is difficult to redirect, angry and agitated and yelling.   Past Medical History:  Diagnosis Date  . Diabetes mellitus without complication (HCC)   . GERD (gastroesophageal reflux disease)   . Hypertension   . Paranoid schizophrenia (HCC)   . Tourette disorder     Patient Active Problem List   Diagnosis Date Noted  . Dizziness 08/25/2018  . Hypokalemia due to inadequate potassium intake 08/16/2018  . Hypertension 08/07/2018  . Malingering 08/07/2018  . Intellectual disability 08/07/2018  . Paranoid schizophrenia (HCC) 07/23/2018  . Diabetes mellitus without complication (HCC) 07/23/2018  . Tourette disorder 07/23/2018    Past Surgical History:  Procedure Laterality Date  . CARDIAC SURGERY    . HAND SURGERY    . TONSILLECTOMY      Prior to Admission medications   Medication Sig Start Date End Date Taking? Authorizing Provider  acetaminophen (TYLENOL) 500 MG tablet Take 500 mg by mouth every 4 (four) hours as needed for mild pain or fever.     [provider]  alum & mag hydroxide-simeth (MINTOX  REGULAR STRENGTH) 200-200-20 MG/5ML suspension Take 15-30 mLs by mouth as needed for indigestion or heartburn.    [provider]  amLODipine (NORVASC) 10 MG tablet Take 10 mg by mouth daily.    [provider]  aspirin EC 325 MG tablet Take 325 mg by mouth daily.    [provider]  atorvastatin (LIPITOR) 80 MG tablet Take 80 mg by mouth at bedtime.     [provider]  benztropine (COGENTIN) 0.5 MG tablet Take 0.5 mg by mouth 2 (two) times daily.    [provider]  carvedilol (COREG) 6.25 MG tablet Take 6.25 mg by mouth 2 (two) times daily.     [provider]  clopidogrel (PLAVIX) 75 MG tablet Take 75 mg by mouth daily.    [provider]  divalproex (DEPAKOTE ER) 500 MG 24 hr tablet Take 1,000 mg by mouth daily. At 1600    [provider]  ezetimibe (ZETIA) 10 MG tablet Take 10 mg by mouth daily. 08/24/18   [provider]  furosemide (LASIX) 40 MG tablet Take 1 tablet (40 mg total) by mouth 2 (two) times daily. 08/17/18   Enid Baas, MD  gabapentin (NEURONTIN) 300 MG capsule Take 300 mg by mouth 3 (three) times daily.    [provider]  glipiZIDE (GLUCOTROL) 5 MG tablet Take 5 mg by mouth daily.     [provider]  guaiFENesin (ROBITUSSIN) 100 MG/5ML SOLN Take 10 mLs by mouth every 6 (six) hours as needed for cough.     [provider]  insulin  glargine (LANTUS) 100 UNIT/ML injection Inject 28-100 Units into the skin See admin instructions. 28 units every morning and 100 units every evening    [provider]  insulin lispro (HUMALOG) 100 UNIT/ML injection Inject 57 Units into the skin 3 (three) times daily before meals.    [provider]  isosorbide mononitrate (IMDUR) 60 MG 24 hr tablet Take 60 mg by mouth daily.    [provider]  loperamide (IMODIUM) 2 MG capsule Take 2 mg by mouth daily as needed for diarrhea or loose stools.     [provider]  LORazepam (ATIVAN) 0.5 MG tablet Take 0.5 mg by mouth 3 (three) times daily as needed for anxiety.     [provider]  magnesium hydroxide (MILK OF MAGNESIA) 400 MG/5ML suspension Take 30 mLs by mouth at bedtime as needed for mild constipation.     [provider]  meclizine (ANTIVERT) 25 MG tablet Take 1 tablet (25 mg total) by mouth 2 (two) times daily. 08/26/18   Shaune Pollack, MD  metFORMIN (GLUCOPHAGE) 1000 MG tablet Take 1,000 mg by mouth 2 (two) times daily.     [provider]  metolazone (ZAROXOLYN) 2.5 MG tablet Take 2.5 mg by mouth daily.    [provider]  neomycin-bacitracin-polymyxin (NEOSPORIN) [phone] ointment Apply 1 application topically as needed (for minor cuts and abrasions).     [provider]  nitroGLYCERIN (NITROSTAT) 0.4 MG SL tablet Place 0.4 mg under the tongue every 5 (five) minutes as needed for chest pain.    [provider]  Pimozide 1 MG TABS Take 1 mg by mouth 2 (two) times daily.    [provider]  potassium chloride 20 MEQ TBCR Take 20 mEq by mouth 2 (two) times daily. 08/17/18   Enid Baas, MD  risperiDONE (RISPERDAL) 1 MG tablet Take 1.5 mg by mouth 2 (two) times daily.     [provider]  tamsulosin (FLOMAX) 0.4 MG CAPS capsule Take 0.4 mg by mouth daily.     [provider]  Vitamin D, Ergocalciferol, (DRISDOL) 50000 units CAPS capsule Take 50,000 Units by mouth every 7 (seven) days.    [provider]    Allergies  Allergen Reactions  . Keflex [Cephalexin] Rash    History reviewed. No pertinent family history.  Social History Social History   Tobacco Use  . Smoking status: Former Games developer  . Smokeless tobacco: Never Used  Substance Use Topics  . Alcohol use: Not Currently  . Drug use: Not Currently    Review of Systems  Constitutional: Negative for fever. Eyes: Negative for visual changes. ENT: Negative for sore  throat. Cardiovascular: Negative for chest pain. Respiratory: Negative for shortness of breath. Gastrointestinal: Negative for abdominal pain, vomiting and diarrhea. Genitourinary: Negative for dysuria. Musculoskeletal: Negative for back pain. Skin: Negative for rash. Neurological: Negative for headache.  ____________________________________________   PHYSICAL EXAM:  VITAL SIGNS: ED Triage Vitals  Enc Vitals Group     BP 09/08/18 0631 (!) 175/99     Pulse Rate 09/08/18 0631 87     Resp 09/08/18 0631 20     Temp 09/08/18 0631 98.3 F (36.8 C)     Temp Source 09/08/18 0631 Oral     SpO2 09/08/18 0631 96 %     Weight --      Height --      Head Circumference --      Peak Flow --      Pain  Score 09/08/18 0635 0     Pain Loc --      Pain Edu? --      Excl. in GC? --      Constitutional: Alert and oriented.  Hard of hearing HEENT      Head: Normocephalic and atraumatic.      Eyes: Conjunctivae are normal. Pupils equal and round.       Ears:         Nose: No congestion/rhinnorhea.      Mouth/Throat: Mucous membranes are moist.      Neck: No stridor. Cardiovascular/Chest: Normal rate, regular rhythm.  No murmurs, rubs, or gallops. Respiratory: Normal respiratory effort without tachypnea nor retractions. Breath sounds are clear and equal bilaterally. No wheezes/rales/rhonchi. Gastrointestinal: Soft. No distention, no guarding, no rebound. Nontender.    Genitourinary/rectal:Deferred Musculoskeletal: Nontender with normal range of motion in all extremities. No joint effusions.  No lower extremity tenderness.  No edema. Neurologic:  Normal speech and language. No gross or focal neurologic deficits are appreciated. Skin:  Skin is warm, dry and intact. No rash noted. Psychiatric: Agitated.  Unable to answer with regard to suicidal homicidal ideation.  Continues to yell that he is not going back to the group home.   ____________________________________________  LABS (pertinent  positives/negatives) I, Governor Rooks, MD the attending physician have reviewed the labs noted below.  Labs Reviewed - No data to display  ____________________________________________    EKG I, Governor Rooks, MD, the attending physician have personally viewed and interpreted all ECGs.  None ____________________________________________  RADIOLOGY   None __________________________________________  PROCEDURES  Procedure(s) performed: None  Procedures  Critical Care performed: None   ____________________________________________  ED COURSE / ASSESSMENT AND PLAN  Pertinent labs & imaging results that were available during my care of the patient were reviewed by me and considered in my medical decision making (see chart for details).  Patient was placed on involuntary commitment paperwork by Dr York Cerise prior to my arrival/eval, because he was agitated and not redirectable, trying to walk out of the ER, but not making any particular sense with regard to where he would go.  He has legal guardian and is unable to making those decisions.   Patient's complaint is that he does not want to be at the group home, it looks like he has made this complaint before.  It is unclear why he is unhappy there although previously he had told me a different visit that he did not like the rules there.  This time he is not saying anything in particular just difficult to redirect.  He is agreeable to take medication to help him be more calm.  He is agreeable to take p.o. Ativan.  Anytime I tried to discuss the typical process of changing group homes happening as an outpatient, patient would stand up and yell that he was not going back.  I am to go ahead and place a TTS consult.   I spoke with patient to let know we are likely going to discharge back to group home and patient became highly agitated again - screaming "I am not going back to that group home!" He was shaking and getting red. I asked him to  please wait and see Dr. Toni Amend.  Patient care transferred to Dr. Marisa Severin at shift change 3 PM.  Disposition per psychiatry.    CONSULTATIONS:   TTS and psychiatry.   Patient / Family / Caregiver informed of clinical course, medical decision-making process, and agree with plan.  ___________________________________________   FINAL CLINICAL IMPRESSION(S) / ED DIAGNOSES   Final diagnoses:  Agitation      ___________________________________________         Note: This dictation was prepared with Dragon dictation. Any transcriptional errors that result from this process are unintentional    Governor Rooks, MD 09/08/18 1510

## 2018-09-08 NOTE — BH Assessment (Signed)
Assessment Note  Jason [name] is an 67 y.o. male who presents to the ER due to having a "nervous breakdown." He further reports he do not like his current Group Home because they are mean to him and do not serve milk and orange juice with breakfast. He states on yesterday (09/07/2018) he came to the ER for similar reason and that he walked to the street because he didn't want to live but was unclear if he was going to walk into traffic or did it just to have a reason to come to the ER. When asked, he started yelling stating he "I'm not going back to the Group Home."  During the interview, the patient was irritable and fixated on not returning to his Group Home. With every question, he would make reference to him not returning to the Group and was not happy there. Patient complains that the staff is mean but was unable to give details about what they are doing or not doing. He states the staff yells at other residents, they do not serve the type of food he wants and their are no females at the Group Home.  [facility] DSS currently have guardianship of the patient. Patient states he do not like the guardian Drue Flirt [phone]) as well because she will not move him to another Group Home.  Diagnosis: Anxiety   Past Medical History:  Past Medical History:  Diagnosis Date  . Diabetes mellitus without complication (HCC)   . GERD (gastroesophageal reflux disease)   . Hypertension   . Paranoid schizophrenia (HCC)   . Tourette disorder     Past Surgical History:  Procedure Laterality Date  . CARDIAC SURGERY    . HAND SURGERY    . TONSILLECTOMY      Family History: History reviewed. No pertinent family history.  Social History:  reports that he has quit smoking. He has never used smokeless tobacco. He reports that he drank alcohol. He reports that he has current or past drug history.  Additional Social History:  Alcohol / Drug Use Pain Medications: See PTA Prescriptions: See  PTA Over the Counter: See PTA History of alcohol / drug use?: No history of alcohol / drug abuse Longest period of sobriety (when/how long): Reports of no past or current use Negative Consequences of Use: (n/a)  CIWA: CIWA-Ar BP: (!) 175/99 Pulse Rate: 87 COWS:    Allergies:  Allergies  Allergen Reactions  . Keflex [Cephalexin] Rash    Home Medications:  (Not in a hospital admission)  OB/GYN Status:  No LMP for male patient.  General Assessment Data Location of Assessment: [facility] ED TTS Assessment: In system Is this a Tele or Face-to-Face Assessment?: Face-to-Face Is this an Initial Assessment or a Re-assessment for this encounter?: Initial Assessment Patient Accompanied by:: N/A Language Other than English: No Living Arrangements: In Group Home: (Comment: Name of Group Home)([facility] [phone]) ) What gender do you identify as?: Male Marital status: Single Pregnancy Status: No Living Arrangements: Group Home([facility]-[phone] ) Can pt return to current living arrangement?: Yes Admission Status: Voluntary Is patient capable of signing voluntary admission?: Yes Referral Source: Self/Family/Friend Insurance type: Medicare  Medical Screening Exam [facility] Walk-in ONLY) Medical Exam completed: Yes  Crisis Care Plan Living Arrangements: Group Home([facility]-[phone] ) Legal Guardian: Other:([place] DSS) Name of Psychiatrist: Reports of none Name of Therapist: Reports of none  Education Status Is patient currently in school?: No Is the patient employed, unemployed or receiving disability?:  Unemployed, Receiving disability income  Risk to self with the past 6 months Suicidal Ideation: No Has patient been a risk to self within the past 6 months prior to admission? : No Suicidal Intent: No Has patient had any suicidal intent within the past 6 months prior to admission? : No Is patient at risk for suicide?:  No Suicidal Plan?: No Has patient had any suicidal plan within the past 6 months prior to admission? : No Access to Means: No What has been your use of drugs/alcohol within the last 12 months?: Reports of none Previous Attempts/Gestures: No How many times?: 0 Other Self Harm Risks: Reports of none Triggers for Past Attempts: None known Intentional Self Injurious Behavior: None Family Suicide History: Unknown Recent stressful life event(s): Conflict (Comment), Turmoil (Comment), Other (Comment)(Do not like Group Home) Persecutory voices/beliefs?: No Depression: Yes Depression Symptoms: Fatigue, Isolating, Feeling angry/irritable, Loss of interest in usual pleasures, Feeling worthless/self pity Substance abuse history and/or treatment for substance abuse?: No Suicide prevention information given to non-admitted patients: Not applicable  Risk to Others within the past 6 months Homicidal Ideation: No Does patient have any lifetime risk of violence toward others beyond the six months prior to admission? : No Thoughts of Harm to Others: No Current Homicidal Intent: No Current Homicidal Plan: No Access to Homicidal Means: No Identified Victim: Reports of none History of harm to others?: No Assessment of Violence: None Noted Violent Behavior Description: Reports of none Does patient have access to weapons?: No Criminal Charges Pending?: No Does patient have a court date: No Is patient on probation?: No  Psychosis Hallucinations: None noted Delusions: None noted  Mental Status Report Appearance/Hygiene: Unremarkable, In scrubs Eye Contact: Fair Motor Activity: Freedom of movement Speech: Logical/coherent, Rapid, Loud Level of Consciousness: Alert, Irritable Mood: Anxious, Suspicious, Irritable Affect: Appropriate to circumstance, Anxious Anxiety Level: Minimal Thought Processes: Coherent, Relevant Judgement: Unimpaired Orientation: Person, Place, Time, Situation, Appropriate  for developmental age Obsessive Compulsive Thoughts/Behaviors: Minimal  Cognitive Functioning Concentration: Normal Memory: Recent Intact, Remote Intact Is patient IDD: No Insight: Fair Impulse Control: Fair Appetite: Good Have you had any weight changes? : No Change Sleep: No Change Total Hours of Sleep: 8 Vegetative Symptoms: None  ADLScreening [facility] Assessment Services) Patient's cognitive ability adequate to safely complete daily activities?: Yes Patient able to express need for assistance with ADLs?: Yes Independently performs ADLs?: Yes (appropriate for developmental age)  Prior Inpatient Therapy Prior Inpatient Therapy: No  Prior Outpatient Therapy Prior Outpatient Therapy: No Does patient have an ACCT team?: No Does patient have Intensive In-House Services?  : No Does patient have [facility] services? : No Does patient have P4CC services?: No  ADL Screening (condition at time of admission) Patient's cognitive ability adequate to safely complete daily activities?: Yes Is the patient deaf or have difficulty hearing?: No Does the patient have difficulty seeing, even when wearing glasses/contacts?: No Does the patient have difficulty concentrating, remembering, or making decisions?: No Patient able to express need for assistance with ADLs?: Yes Does the patient have difficulty dressing or bathing?: No Independently performs ADLs?: Yes (appropriate for developmental age) Does the patient have difficulty walking or climbing stairs?: No Weakness of Legs: None Weakness of Arms/Hands: None  Home Assistive Devices/Equipment Home Assistive Devices/Equipment: None  Therapy Consults (therapy consults require a physician order) PT Evaluation Needed: No OT Evalulation Needed: No SLP Evaluation Needed: No Abuse/Neglect Assessment (Assessment to be complete while patient is alone) Abuse/Neglect Assessment Can Be Completed: Yes Physical Abuse: Denies  Verbal Abuse: Denies Sexual  Abuse: Denies Exploitation of patient/patient's resources: Denies Self-Neglect: Denies Values / Beliefs Cultural Requests During Hospitalization: None Spiritual Requests During Hospitalization: None Consults Spiritual Care Consult Needed: No Social Work Consult Needed: No         Child/Adolescent Assessment Running Away Risk: Denies(Patient is an adult)  Disposition:  Disposition Initial Assessment Completed for this Encounter: Yes  On Site Evaluation by:   Reviewed with Physician:    Lilyan Gilford MS, LCAS, LPC, NCC, CCSI Therapeutic Triage Specialist 09/08/2018 3:11 PM

## 2018-09-08 NOTE — ED Notes (Addendum)

## 2018-09-08 NOTE — ED Notes (Signed)
Attempted to contact his legal guardian -  I was going to leave a message but phone cut off

## 2018-09-08 NOTE — ED Notes (Signed)
Attempted to contact his legal guardian

## 2018-09-08 NOTE — ED Triage Notes (Signed)
Patient BIB [facility].  He was picked up from his group home stating, "I had a nervous breakdown.  I wasn't ready to discharge.  I do not want to go back to that group home."

## 2018-09-09 DIAGNOSIS — R451 Restlessness and agitation: Secondary | ICD-10-CM | POA: Diagnosis not present

## 2018-09-09 MED ORDER — LORAZEPAM 1 MG PO TABS
1.0000 mg | ORAL_TABLET | Freq: Once | ORAL | Status: AC
  Administered 2018-09-09: 1 mg via ORAL
  Filled 2018-09-09: qty 1

## 2018-09-09 NOTE — ED Notes (Signed)
Pt discharged via [facility] services back to [facility].  Pt denies SI, HI, and A/V hallucinations.  Pt verbalizes understanding of discharge instructions and follow-up care recommendations.

## 2018-09-09 NOTE — ED Notes (Signed)
[facility] [phone]) to arrange for discharge this AM.  Phone is currently going to fax machine.  Unable to leave message or speak with representative.  Group Home address is  [address]

## 2018-09-09 NOTE — ED Notes (Signed)
Pt callled [facility].  Staff member stated that she would call [facility] to come pick him up.

## 2018-09-09 NOTE — ED Notes (Signed)
Earlier in the shift, EDP informed patient that he would be discharged in the morning.  Patient was moved to hallway bed.  Patient is currently trying to sabotage discharge by asking CNA, "Do you have something I can hurt myself with?"  Patient then attempted to grab the fan at the Police Officers desk at which time the officers pryed to fan from patients hands and placed him back in bed.

## 2018-09-09 NOTE — ED Provider Notes (Addendum)
-----------------------------------------   12:56 AM on 09/09/2018 -----------------------------------------   Blood pressure (!) 154/89, pulse 78, temperature 98.2 F (36.8 C), resp. rate 16, SpO2 97 %.  The patient had no acute events since last update.  Calm and cooperative at this time.  I reviewed Dr. Toni Amend previous note and the patient has no acute medical or psychiatric issues and actually seems to be malingering.  Unclear why the patient was not actually discharged yesterday however he is discharged tonight.    Merrily Brittle, MD 09/09/18 0057   ----------------------------------------- 7:14 AM on 09/09/2018 -----------------------------------------  The patient now agrees to go back to his group home.   Merrily Brittle, MD 09/09/18 [phone]

## 2018-09-09 NOTE — ED Notes (Signed)
[facility] [phone]) to arrange for discharge. Phone still going to fax.

## 2018-10-14 ENCOUNTER — Encounter (HOSPITAL_COMMUNITY): Payer: Self-pay | Admitting: *Deleted

## 2018-10-14 ENCOUNTER — Other Ambulatory Visit: Payer: Self-pay

## 2018-10-14 ENCOUNTER — Emergency Department (HOSPITAL_COMMUNITY): Payer: Medicare Other

## 2018-10-14 ENCOUNTER — Observation Stay (HOSPITAL_COMMUNITY)
Admission: EM | Admit: 2018-10-14 | Discharge: 2018-10-15 | Disposition: A | Discharge status: Home / Self Care | Payer: Medicare Other | Attending: Family Medicine | Admitting: Family Medicine

## 2018-10-14 DIAGNOSIS — Z79899 Other long term (current) drug therapy: Secondary | ICD-10-CM | POA: Insufficient documentation

## 2018-10-14 DIAGNOSIS — R079 Chest pain, unspecified: Secondary | ICD-10-CM

## 2018-10-14 DIAGNOSIS — R739 Hyperglycemia, unspecified: Secondary | ICD-10-CM

## 2018-10-14 DIAGNOSIS — R197 Diarrhea, unspecified: Secondary | ICD-10-CM

## 2018-10-14 DIAGNOSIS — Z951 Presence of aortocoronary bypass graft: Secondary | ICD-10-CM | POA: Diagnosis not present

## 2018-10-14 DIAGNOSIS — J189 Pneumonia, unspecified organism: Secondary | ICD-10-CM | POA: Diagnosis not present

## 2018-10-14 DIAGNOSIS — E119 Type 2 diabetes mellitus without complications: Secondary | ICD-10-CM | POA: Diagnosis not present

## 2018-10-14 DIAGNOSIS — I10 Essential (primary) hypertension: Secondary | ICD-10-CM | POA: Insufficient documentation

## 2018-10-14 DIAGNOSIS — Z7982 Long term (current) use of aspirin: Secondary | ICD-10-CM | POA: Insufficient documentation

## 2018-10-14 DIAGNOSIS — Z8673 Personal history of transient ischemic attack (TIA), and cerebral infarction without residual deficits: Secondary | ICD-10-CM | POA: Diagnosis not present

## 2018-10-14 DIAGNOSIS — F200 Paranoid schizophrenia: Secondary | ICD-10-CM | POA: Diagnosis present

## 2018-10-14 DIAGNOSIS — E079 Disorder of thyroid, unspecified: Secondary | ICD-10-CM

## 2018-10-14 DIAGNOSIS — F952 Tourette's disorder: Secondary | ICD-10-CM | POA: Diagnosis present

## 2018-10-14 DIAGNOSIS — F79 Unspecified intellectual disabilities: Secondary | ICD-10-CM

## 2018-10-14 DIAGNOSIS — R531 Weakness: Secondary | ICD-10-CM | POA: Diagnosis present

## 2018-10-14 HISTORY — DX: Cerebral infarction, unspecified: I63.9

## 2018-10-14 LAB — COMPREHENSIVE METABOLIC PANEL
ALT: 16 U/L (ref 0–44)
ANION GAP: 11 (ref 5–15)
AST: 17 U/L (ref 15–41)
Albumin: 3.7 g/dL (ref 3.5–5.0)
Alkaline Phosphatase: 52 U/L (ref 38–126)
BUN: 17 mg/dL (ref 8–23)
CHLORIDE: 96 mmol/L — AB (ref 98–111)
CO2: 28 mmol/L (ref 22–32)
CREATININE: 1.09 mg/dL (ref 0.61–1.24)
Calcium: 9.3 mg/dL (ref 8.9–10.3)
Glucose, Bld: 263 mg/dL — ABNORMAL HIGH (ref 70–99)
POTASSIUM: 3.4 mmol/L — AB (ref 3.5–5.1)
Sodium: 135 mmol/L (ref 135–145)
Total Bilirubin: 0.6 mg/dL (ref 0.3–1.2)
Total Protein: 7.1 g/dL (ref 6.5–8.1)

## 2018-10-14 LAB — URINALYSIS, ROUTINE W REFLEX MICROSCOPIC
BACTERIA UA: NONE SEEN
Bilirubin Urine: NEGATIVE
KETONES UR: 5 mg/dL — AB
Leukocytes, UA: NEGATIVE
Nitrite: NEGATIVE
Protein, ur: NEGATIVE mg/dL
Specific Gravity, Urine: 1.006 (ref 1.005–1.030)
pH: 7 (ref 5.0–8.0)

## 2018-10-14 LAB — CBC WITH DIFFERENTIAL/PLATELET
Abs Immature Granulocytes: 0.07 10*3/uL (ref 0.00–0.07)
BASOS PCT: 0 %
Basophils Absolute: 0 10*3/uL (ref 0.0–0.1)
EOS ABS: 0.6 10*3/uL — AB (ref 0.0–0.5)
Eosinophils Relative: 6 %
HCT: 39.2 % (ref 39.0–52.0)
Hemoglobin: 12.9 g/dL — ABNORMAL LOW (ref 13.0–17.0)
Immature Granulocytes: 1 %
Lymphocytes Relative: 14 %
Lymphs Abs: 1.3 10*3/uL (ref 0.7–4.0)
MCH: 27.9 pg (ref 26.0–34.0)
MCHC: 32.9 g/dL (ref 30.0–36.0)
MCV: 84.7 fL (ref 80.0–100.0)
MONO ABS: 0.8 10*3/uL (ref 0.1–1.0)
MONOS PCT: 9 %
Neutro Abs: 6.2 10*3/uL (ref 1.7–7.7)
Neutrophils Relative %: 70 %
Platelets: 220 10*3/uL (ref 150–400)
RBC: 4.63 MIL/uL (ref 4.22–5.81)
RDW: 14.5 % (ref 11.5–15.5)
WBC: 8.9 10*3/uL (ref 4.0–10.5)
nRBC: 0 % (ref 0.0–0.2)

## 2018-10-14 LAB — LIPASE, BLOOD: LIPASE: 24 U/L (ref 11–51)

## 2018-10-14 LAB — TROPONIN I
Troponin I: <0.03 ng/mL (ref <0.03)
Troponin I: <0.03 ng/mL (ref <0.03)

## 2018-10-14 LAB — GLUCOSE, CAPILLARY
Glucose-Capillary: 244 mg/dL — ABNORMAL HIGH (ref 70–99)
Glucose-Capillary: 184 mg/dL — ABNORMAL HIGH (ref 70–99)

## 2018-10-14 MED ORDER — DM-GUAIFENESIN ER 30-600 MG PO TB12
1.0000 | ORAL_TABLET | Freq: Two times a day (BID) | ORAL | Status: DC
  Administered 2018-10-15: 1 via ORAL
  Filled 2018-10-14: qty 1

## 2018-10-14 MED ORDER — SODIUM CHLORIDE 0.9 % IV SOLN
2.0000 g | INTRAVENOUS | Status: DC
  Filled 2018-10-14 (×2): qty 20

## 2018-10-14 MED ORDER — LORAZEPAM 0.5 MG PO TABS: 0.5000 mg | ORAL_TABLET | Freq: Three times a day (TID) | ORAL | Status: DC | PRN

## 2018-10-14 MED ORDER — POLYETHYLENE GLYCOL 3350 17 G PO PACK: 17.0000 g | PACK | Freq: Every day | ORAL | Status: DC | PRN

## 2018-10-14 MED ORDER — CARVEDILOL 3.125 MG PO TABS
6.2500 mg | ORAL_TABLET | Freq: Two times a day (BID) | ORAL | Status: DC
  Administered 2018-10-14 – 2018-10-15 (×2): 6.25 mg via ORAL
  Filled 2018-10-14 (×2): qty 2

## 2018-10-14 MED ORDER — AMLODIPINE BESYLATE 5 MG PO TABS
10.0000 mg | ORAL_TABLET | Freq: Every day | ORAL | Status: DC
  Administered 2018-10-14 – 2018-10-15 (×2): 10 mg via ORAL
  Filled 2018-10-14 (×2): qty 2

## 2018-10-14 MED ORDER — SODIUM CHLORIDE 0.9 % IV SOLN
1.0000 g | Freq: Once | INTRAVENOUS | Status: AC
  Administered 2018-10-14: 1 g via INTRAVENOUS
  Filled 2018-10-14: qty 10

## 2018-10-14 MED ORDER — ATORVASTATIN CALCIUM 40 MG PO TABS
80.0000 mg | ORAL_TABLET | Freq: Every day | ORAL | Status: DC
  Administered 2018-10-14: 80 mg via ORAL
  Filled 2018-10-14: qty 2

## 2018-10-14 MED ORDER — ACETAMINOPHEN 325 MG PO TABS: 650.0000 mg | ORAL_TABLET | Freq: Four times a day (QID) | ORAL | Status: DC | PRN

## 2018-10-14 MED ORDER — METOLAZONE 5 MG PO TABS
2.5000 mg | ORAL_TABLET | Freq: Every day | ORAL | Status: DC
  Administered 2018-10-15: 2.5 mg via ORAL
  Filled 2018-10-14: qty 1

## 2018-10-14 MED ORDER — MORPHINE SULFATE (PF) 4 MG/ML IV SOLN
4.0000 mg | Freq: Once | INTRAVENOUS | Status: AC
  Administered 2018-10-14: 4 mg via INTRAVENOUS
  Filled 2018-10-14: qty 1

## 2018-10-14 MED ORDER — DIVALPROEX SODIUM ER 500 MG PO TB24
1000.0000 mg | ORAL_TABLET | Freq: Every day | ORAL | Status: DC
  Administered 2018-10-14: 1000 mg via ORAL
  Filled 2018-10-14: qty 2

## 2018-10-14 MED ORDER — CLOPIDOGREL BISULFATE 75 MG PO TABS
75.0000 mg | ORAL_TABLET | Freq: Every day | ORAL | Status: DC
  Administered 2018-10-14 – 2018-10-15 (×2): 75 mg via ORAL
  Filled 2018-10-14 (×2): qty 1

## 2018-10-14 MED ORDER — POTASSIUM CHLORIDE CRYS ER 20 MEQ PO TBCR
20.0000 meq | EXTENDED_RELEASE_TABLET | Freq: Two times a day (BID) | ORAL | Status: DC
  Administered 2018-10-15: 20 meq via ORAL
  Filled 2018-10-14: qty 1

## 2018-10-14 MED ORDER — DOXYCYCLINE HYCLATE 100 MG PO CAPS: 100.0000 mg | ORAL_CAPSULE | Freq: Two times a day (BID) | ORAL | 0 refills | Status: DC

## 2018-10-14 MED ORDER — SODIUM CHLORIDE 0.9 % IV SOLN
INTRAVENOUS | Status: DC | PRN
  Administered 2018-10-14 (×2): 500 mL via INTRAVENOUS

## 2018-10-14 MED ORDER — PIMOZIDE 1 MG PO TABS: 1.0000 mg | ORAL_TABLET | Freq: Two times a day (BID) | ORAL | Status: DC

## 2018-10-14 MED ORDER — FUROSEMIDE 40 MG PO TABS
40.0000 mg | ORAL_TABLET | Freq: Two times a day (BID) | ORAL | Status: DC
  Administered 2018-10-14 – 2018-10-15 (×2): 40 mg via ORAL
  Filled 2018-10-14 (×2): qty 1

## 2018-10-14 MED ORDER — TAMSULOSIN HCL 0.4 MG PO CAPS
0.4000 mg | ORAL_CAPSULE | Freq: Every day | ORAL | Status: DC
  Administered 2018-10-14 – 2018-10-15 (×2): 0.4 mg via ORAL
  Filled 2018-10-14 (×2): qty 1

## 2018-10-14 MED ORDER — SODIUM CHLORIDE 0.9 % IV SOLN
1.0000 g | INTRAVENOUS | Status: DC
  Filled 2018-10-14 (×2): qty 10

## 2018-10-14 MED ORDER — ONDANSETRON HCL 4 MG/2ML IJ SOLN
4.0000 mg | Freq: Once | INTRAMUSCULAR | Status: AC
  Administered 2018-10-14: 4 mg via INTRAVENOUS
  Filled 2018-10-14: qty 2

## 2018-10-14 MED ORDER — ACETAMINOPHEN 650 MG RE SUPP: 650.0000 mg | Freq: Four times a day (QID) | RECTAL | Status: DC | PRN

## 2018-10-14 MED ORDER — ONDANSETRON HCL 4 MG/2ML IJ SOLN: 4.0000 mg | Freq: Four times a day (QID) | INTRAMUSCULAR | Status: DC | PRN

## 2018-10-14 MED ORDER — ENOXAPARIN SODIUM 40 MG/0.4ML ~~LOC~~ SOLN
40.0000 mg | SUBCUTANEOUS | Status: DC
  Administered 2018-10-14: 40 mg via SUBCUTANEOUS
  Filled 2018-10-14: qty 0.4

## 2018-10-14 MED ORDER — SODIUM CHLORIDE 0.9 % IV SOLN
1.0000 g | INTRAVENOUS | Status: AC
  Administered 2018-10-14: 1 g via INTRAVENOUS
  Filled 2018-10-14 (×2): qty 10

## 2018-10-14 MED ORDER — SODIUM CHLORIDE 0.9 % IV BOLUS
1000.0000 mL | Freq: Once | INTRAVENOUS | Status: AC
  Administered 2018-10-14: 1000 mL via INTRAVENOUS

## 2018-10-14 MED ORDER — INSULIN ASPART 100 UNIT/ML ~~LOC~~ SOLN
15.0000 [IU] | Freq: Three times a day (TID) | SUBCUTANEOUS | Status: DC
  Administered 2018-10-15: 15 [IU] via SUBCUTANEOUS

## 2018-10-14 MED ORDER — GLIPIZIDE 5 MG PO TABS
5.0000 mg | ORAL_TABLET | Freq: Every day | ORAL | Status: DC
  Administered 2018-10-14: 5 mg via ORAL
  Filled 2018-10-14 (×2): qty 1

## 2018-10-14 MED ORDER — ISOSORBIDE MONONITRATE ER 60 MG PO TB24
60.0000 mg | ORAL_TABLET | Freq: Every day | ORAL | Status: DC
  Administered 2018-10-14 – 2018-10-15 (×2): 60 mg via ORAL
  Filled 2018-10-14 (×2): qty 1

## 2018-10-14 MED ORDER — GABAPENTIN 300 MG PO CAPS
300.0000 mg | ORAL_CAPSULE | Freq: Three times a day (TID) | ORAL | Status: DC
  Administered 2018-10-14 – 2018-10-15 (×3): 300 mg via ORAL
  Filled 2018-10-14 (×3): qty 1

## 2018-10-14 MED ORDER — SODIUM CHLORIDE 0.9 % IV SOLN
500.0000 mg | INTRAVENOUS | Status: DC
  Administered 2018-10-14: 500 mg via INTRAVENOUS
  Filled 2018-10-14 (×3): qty 500

## 2018-10-14 MED ORDER — RISPERIDONE 1 MG PO TABS
1.5000 mg | ORAL_TABLET | Freq: Two times a day (BID) | ORAL | Status: DC
  Administered 2018-10-14 – 2018-10-15 (×2): 1.5 mg via ORAL
  Filled 2018-10-14 (×2): qty 1

## 2018-10-14 MED ORDER — EZETIMIBE 10 MG PO TABS
10.0000 mg | ORAL_TABLET | Freq: Every day | ORAL | Status: DC
  Administered 2018-10-15: 10 mg via ORAL
  Filled 2018-10-14: qty 1

## 2018-10-14 MED ORDER — INSULIN ASPART 100 UNIT/ML ~~LOC~~ SOLN: 0.0000 [IU] | Freq: Every day | SUBCUTANEOUS | Status: DC

## 2018-10-14 MED ORDER — INSULIN ASPART 100 UNIT/ML ~~LOC~~ SOLN
0.0000 [IU] | Freq: Three times a day (TID) | SUBCUTANEOUS | Status: DC
  Administered 2018-10-14: 5 [IU] via SUBCUTANEOUS
  Administered 2018-10-15: 3 [IU] via SUBCUTANEOUS
  Administered 2018-10-15: 8 [IU] via SUBCUTANEOUS

## 2018-10-14 MED ORDER — ASPIRIN EC 325 MG PO TBEC
325.0000 mg | DELAYED_RELEASE_TABLET | Freq: Every day | ORAL | Status: DC
  Administered 2018-10-14 – 2018-10-15 (×2): 325 mg via ORAL
  Filled 2018-10-14 (×2): qty 1

## 2018-10-14 MED ORDER — ONDANSETRON HCL 4 MG PO TABS: 4.0000 mg | ORAL_TABLET | Freq: Four times a day (QID) | ORAL | Status: DC | PRN

## 2018-10-14 MED ORDER — BENZTROPINE MESYLATE 1 MG PO TABS
0.5000 mg | ORAL_TABLET | Freq: Two times a day (BID) | ORAL | Status: DC
  Administered 2018-10-14 – 2018-10-15 (×2): 0.5 mg via ORAL
  Filled 2018-10-14 (×2): qty 1

## 2018-10-14 NOTE — ED Notes (Addendum)
GROUP HOME # [phone]

## 2018-10-14 NOTE — ED Notes (Signed)
Pt was sitting on the side of the bed and became dizzy. Pt laid back down and RN notified.

## 2018-10-14 NOTE — ED Notes (Signed)
Patient is sitting on side of the bed. Asked pt to lay down on the bed. Patient refused. Patient stated "I want to sit right here."

## 2018-10-14 NOTE — ED Notes (Signed)
Patient states he wants to go home. I told patient we needed to do some tests on his heart.

## 2018-10-14 NOTE — ED Notes (Signed)
Called group home, told to hold off on transporting patient due to patient's complaint of chest pain

## 2018-10-14 NOTE — Discharge Instructions (Addendum)
Jason [name] has a small amount of pneumonia in his right lung.  Antibiotics were started in the hospital.  He can start his antibiotic tablets tomorrow.  Increase fluids.  Rest.  Return if worse.    Community-Acquired Pneumonia, Adult Pneumonia is an infection of the lungs. One type of pneumonia can happen while a person is in a hospital. A different type can happen when a person is not in a hospital (community-acquired pneumonia). It is easy for this kind to spread from person to person. It can spread to you if you breathe near an infected person who coughs or sneezes. Some symptoms include:  A dry cough.  A wet (productive) cough.  Fever.  Sweating.  Chest pain.  Follow these instructions at home:  Take over-the-counter and prescription medicines only as told by your doctor. ? Only take cough medicine if you are losing sleep. ? If you were prescribed an antibiotic medicine, take it as told by your doctor. Do not stop taking the antibiotic even if you start to feel better.  Sleep with your head and neck raised (elevated). You can do this by putting a few pillows under your head, or you can sleep in a recliner.  Do not use tobacco products. These include cigarettes, chewing tobacco, and e-cigarettes. If you need help quitting, ask your doctor.  Drink enough water to keep your pee (urine) clear or pale yellow. A shot (vaccine) can help prevent pneumonia. Shots are often suggested for:  People older than 65 years of age.  People older than 65 years of age: ? Who are having cancer treatment. ? Who have long-term (chronic) lung disease. ? Who have problems with their body's defense system (immune system).  You may also prevent pneumonia if you take these actions:  Get the flu (influenza) shot every year.  Go to the dentist as often as told.  Wash your hands often. If soap and water are not available, use hand sanitizer.  Contact a doctor if:  You have a fever.  You lose  sleep because your cough medicine does not help. Get help right away if:  You are short of breath and it gets worse.  You have more chest pain.  Your sickness gets worse. This is very serious if: ? You are an older adult. ? Your body's defense system is weak.  You cough up blood. This information is not intended to replace advice given to you by your health care provider. Make sure you discuss any questions you have with your health care provider. Document Released: 04/22/2008 Document Revised: 04/11/2016 Document Reviewed: 03/01/2015 Elsevier Interactive Patient Education  [employer]2018 [employer]

## 2018-10-14 NOTE — ED Notes (Signed)
Patient requesting food, and water. I told patient that his chart said he was to remain NPO at this time.

## 2018-10-14 NOTE — ED Notes (Signed)
Patient removed IV, stated he did not want another.

## 2018-10-14 NOTE — ED Notes (Signed)
Pt given peanut butter & saltine cracker & a diet sprite at this time.

## 2018-10-14 NOTE — ED Notes (Signed)
Patient ripped BP cuff off and leads. Encouraged pt to wear leads, patient refused. Patient stated "I want to go home now." Patient refuses to lay in bed.

## 2018-10-14 NOTE — ED Provider Notes (Addendum)
[facility] EMERGENCY DEPARTMENT Provider Note   CSN: [id number] Arrival date & time: 10/14/18  4098     History   Chief Complaint Chief Complaint  Patient presents with  . Weakness    HPI Jason [name] is a 67 y.o. male.  Level 5 caveat for schizophrenia.  Patient resides in a group home.  He describes generalized weakness with associated cough and diarrhea for 1 to 2 days.  No purulent sputum, chest pain, dyspnea, dysuria.  Patient is ambulatory.  Patient apparently fell while going to the bathroom this morning.  Initial glucose 276, BP 150/100 per EMS.     Past Medical History:  Diagnosis Date  . Diabetes mellitus without complication (HCC)   . GERD (gastroesophageal reflux disease)   . Hypertension   . Paranoid schizophrenia (HCC)   . Stroke (HCC)   . Tourette disorder     Patient Active Problem List   Diagnosis Date Noted  . Dizziness 08/25/2018  . Hypokalemia due to inadequate potassium intake 08/16/2018  . Hypertension 08/07/2018  . Malingering 08/07/2018  . Intellectual disability 08/07/2018  . Paranoid schizophrenia (HCC) 07/23/2018  . Diabetes mellitus without complication (HCC) 07/23/2018  . Tourette disorder 07/23/2018    Past Surgical History:  Procedure Laterality Date  . CARDIAC SURGERY    . CORONARY ARTERY BYPASS GRAFT    . HAND SURGERY    . TONSILLECTOMY          Home Medications    Prior to Admission medications   Medication Sig Start Date End Date Taking? Authorizing Provider  acetaminophen (TYLENOL) 500 MG tablet Take 500 mg by mouth every 4 (four) hours as needed for mild pain or fever.    Yes [provider]  alum & mag hydroxide-simeth (MINTOX REGULAR STRENGTH) 200-200-20 MG/5ML suspension Take 15-30 mLs by mouth as needed for indigestion or heartburn.   Yes [provider]  amLODipine (NORVASC) 10 MG tablet Take 10 mg by mouth daily.   Yes [provider]  aspirin EC 325 MG tablet Take 325 mg by  mouth daily.   Yes [provider]  atorvastatin (LIPITOR) 80 MG tablet Take 80 mg by mouth at bedtime.    Yes [provider]  benztropine (COGENTIN) 0.5 MG tablet Take 0.5 mg by mouth 2 (two) times daily.   Yes [provider]  carvedilol (COREG) 6.25 MG tablet Take 6.25 mg by mouth 2 (two) times daily.    Yes [provider]  clopidogrel (PLAVIX) 75 MG tablet Take 75 mg by mouth daily.   Yes [provider]  divalproex (DEPAKOTE ER) 500 MG 24 hr tablet Take 1,000 mg by mouth daily. At 1600   Yes [provider]  ezetimibe (ZETIA) 10 MG tablet Take 10 mg by mouth daily. 08/24/18  Yes [provider]  furosemide (LASIX) 40 MG tablet Take 1 tablet (40 mg total) by mouth 2 (two) times daily. 08/17/18  Yes Enid Baas, MD  gabapentin (NEURONTIN) 300 MG capsule Take 300 mg by mouth 3 (three) times daily.   Yes [provider]  glipiZIDE (GLUCOTROL) 5 MG tablet Take 5 mg by mouth daily.    Yes [provider]  guaiFENesin (ROBITUSSIN) 100 MG/5ML SOLN Take 10 mLs by mouth every 6 (six) hours as needed for cough.    Yes [provider]  insulin lispro (HUMALOG) 100 UNIT/ML injection Inject 57 Units into the skin 3 (three) times daily before meals.   Yes [provider]  isosorbide mononitrate (IMDUR) 60 MG 24 hr tablet Take 60 mg by mouth daily.   Yes [provider]  loperamide (IMODIUM) 2 MG capsule Take 2 mg by mouth daily as needed for diarrhea or loose stools.    Yes [provider]  LORazepam (ATIVAN) 0.5 MG tablet Take 0.5 mg by mouth 3 (three) times daily as needed for anxiety.    Yes [provider]  magnesium hydroxide (MILK OF MAGNESIA) 400 MG/5ML suspension Take 30 mLs by mouth at bedtime as needed for mild constipation.    Yes [provider]  metFORMIN (GLUCOPHAGE) 1000 MG tablet Take 1,000 mg by mouth 2 (two) times daily.    Yes [provider]    metolazone (ZAROXOLYN) 2.5 MG tablet Take 2.5 mg by mouth daily.   Yes [provider]  neomycin-bacitracin-polymyxin (NEOSPORIN) [phone] ointment Apply 1 application topically as needed (for minor cuts and abrasions).    Yes [provider]  nitroGLYCERIN (NITROSTAT) 0.4 MG SL tablet Place 0.4 mg under the tongue every 5 (five) minutes as needed for chest pain.   Yes [provider]  Pimozide 1 MG TABS Take 1 mg by mouth 2 (two) times daily.   Yes [provider]  potassium chloride 20 MEQ TBCR Take 20 mEq by mouth 2 (two) times daily. 08/17/18  Yes Enid BaasKalisetti, Radhika, MD  risperiDONE (RISPERDAL) 1 MG tablet Take 1.5 mg by mouth 2 (two) times daily.    Yes [provider]  tamsulosin (FLOMAX) 0.4 MG CAPS capsule Take 0.4 mg by mouth daily.    Yes [provider]  TRESIBA FLEXTOUCH 100 UNIT/ML SOPN FlexTouch Pen  09/17/18  Yes [provider]  TRUE METRIX BLOOD GLUCOSE TEST test strip  10/05/18  Yes [provider]  Vitamin D, Ergocalciferol, (DRISDOL) 50000 units CAPS capsule Take 50,000 Units by mouth every 7 (seven) days.   Yes [provider]  doxycycline (VIBRAMYCIN) 100 MG capsule Take 1 capsule (100 mg total) by mouth 2 (two) times daily. 10/14/18   Donnetta Hutchingook, Onalee Steinbach, MD  insulin glargine (LANTUS) 100 UNIT/ML injection Inject 28-100 Units into the skin See admin instructions. 28 units every morning and 100 units every evening    [provider]  meclizine (ANTIVERT) 25 MG tablet Take 1 tablet (25 mg total) by mouth 2 (two) times daily. Patient not taking: Reported on 10/14/2018 08/26/18   Shaune Pollackhen, Qing, MD    Family History No family history on file.  Social History Social History   Tobacco Use  . Smoking status: Never Smoker  . Smokeless tobacco: Never Used  Substance Use Topics  . Alcohol use: Not Currently  . Drug use: Not Currently     Allergies   Keflex [cephalexin]   Review of  Systems Review of Systems  Unable to perform ROS: Psychiatric disorder     Physical Exam Updated Vital Signs BP (!) 150/124   Pulse 78   Temp 98 F (36.7 C) (Oral)   Resp 18   Ht 6' (1.829 m)   Wt 134.8 kg   SpO2 98%   BMI 40.30 kg/m   Physical Exam  Constitutional:  Normal respirations; no obvious distress  HENT:  Head: Normocephalic and atraumatic.  Eyes: Conjunctivae are normal.  Neck: Neck supple.  Cardiovascular: Normal rate and regular rhythm.  Pulmonary/Chest: Effort normal and breath sounds normal.  Abdominal: Soft. Bowel sounds are normal.  Musculoskeletal: Normal range of motion.  Neurological: He is alert.  Skin:  Skin is warm and dry.  Psychiatric:  Flat affect, flight of ideas  Nursing note and vitals reviewed.    ED Treatments / Results  Labs (all labs ordered are listed, but only abnormal results are displayed) Labs Reviewed  URINALYSIS, ROUTINE W REFLEX MICROSCOPIC - Abnormal; Notable for the following components:      Result Value   Color, Urine STRAW (*)    Glucose, UA >=500 (*)    Hgb urine dipstick SMALL (*)    Ketones, ur 5 (*)    All other components within normal limits  CBC WITH DIFFERENTIAL/PLATELET - Abnormal; Notable for the following components:   Hemoglobin 12.9 (*)    Eosinophils Absolute 0.6 (*)    All other components within normal limits  COMPREHENSIVE METABOLIC PANEL - Abnormal; Notable for the following components:   Potassium 3.4 (*)    Chloride 96 (*)    Glucose, Bld 263 (*)    All other components within normal limits  LIPASE, BLOOD  TROPONIN I  TROPONIN I    EKG EKG Interpretation  Date/Time:  Wednesday October 14 2018 09:06:45 EST Ventricular Rate:  77 PR Interval:    QRS Duration: 164 QT Interval:  427 QTC Calculation: 484 R Axis:   -133 Text Interpretation:  Sinus or ectopic atrial rhythm Prolonged PR interval Right bundle branch block Confirmed by Donnetta Hutching ([id number]) on 10/14/2018 12:47:00  PM   Radiology Dg Chest Port 1 View  Result Date: 10/14/2018 CLINICAL DATA:  Weakness EXAM: PORTABLE CHEST 1 VIEW COMPARISON:  Portable chest x-ray of August 25, 2018 FINDINGS: The right lung is well-expanded. On the left there is hazy increased density in the lower hemithorax with partial obscuration of the hemidiaphragm. The left heart border is obscured. The pulmonary vascularity is mildly prominent centrally but stable. The sternal wires are intact. IMPRESSION: Findings worrisome for lingular atelectasis or pneumonia which has developed since the previous study. Small left pleural effusion. Low-grade CHF which is stable. Electronically Signed   By: David  [place] M.D.   On: 10/14/2018 09:48    Procedures Procedures (including critical care time)  Medications Ordered in ED Medications  azithromycin (ZITHROMAX) 500 mg in sodium chloride 0.9 % 250 mL IVPB (500 mg Intravenous New Bag/Given 10/14/18 1416)  0.9 %  sodium chloride infusion (500 mLs Intravenous New Bag/Given 10/14/18 1415)  sodium chloride 0.9 % bolus 1,000 mL (0 mLs Intravenous Stopped 10/14/18 1308)  ondansetron (ZOFRAN) injection 4 mg (4 mg Intravenous Given 10/14/18 0953)  cefTRIAXone (ROCEPHIN) 1 g in sodium chloride 0.9 % 100 mL IVPB (0 g Intravenous Stopped 10/14/18 1415)  morphine 4 MG/ML injection 4 mg (4 mg Intravenous Given 10/14/18 1530)     Initial Impression / Assessment and Plan / ED Course  I have reviewed the triage vital signs and the nursing notes.  Pertinent labs & imaging results that were available during my care of the patient were reviewed by me and considered in my medical decision making (see chart for details).     Patient with schizophrenia presents with diarrhea and weakness.  X-ray suggests lingular pneumonia.  Glucose elevated.  Small amount of ketones in urine.  Will Rx IV fluids, IV Rocephin and IV Zithromax.   1520: Called into room by RN.  Patient is now complaining of chest pain.  Will  obtain second troponin, IV morphine, admit to general medicine.  CRITICAL CARE Performed by: Donnetta Hutching Total critical care time: 30 minutes Critical care time was exclusive of separately  billable procedures and treating other patients. Critical care was necessary to treat or prevent imminent or life-threatening deterioration. Critical care was time spent personally by me on the following activities: development of treatment plan with patient and/or surrogate as well as nursing, discussions with consultants, evaluation of patient's response to treatment, examination of patient, obtaining history from patient or surrogate, ordering and performing treatments and interventions, ordering and review of laboratory studies, ordering and review of radiographic studies, pulse oximetry and re-evaluation of patient's condition. Final Clinical Impressions(s) / ED Diagnoses   Final diagnoses:  Community acquired pneumonia of right lung, unspecified part of lung  Chest pain, unspecified type  Diarrhea, unspecified type  Hyperglycemia    ED Discharge Orders         Ordered    doxycycline (VIBRAMYCIN) 100 MG capsule  2 times daily     10/14/18 1449           Donnetta Hutching, MD 10/14/18 1514    Donnetta Hutching, MD 10/15/18 [phone]

## 2018-10-14 NOTE — H&P (Signed)
History and Physical    Jason [name]:[id number] DOB: Oct 04, 1951 DOA: 10/14/2018  PCP: Patient, No Pcp Per   Patient coming from: Group home  Chief Complaint: SOB, cough  HPI: Jason [name] is a 67 y.o. male with medical history significant for CAD with CABG, Tourette's, intellectual disability, schizophrenia, HTN, DM was brought to the ED with complaints of shortness of breath and dry cough that started today.,  With generalized weakness and frequent urination for the past 2 days.  Reports of diarrhea per chart but patient denies, and no loose stools today in the ED. Patient denies abdominal pain.  Denies dysuria. While in the ED patient reported chest pain-right-sided, nonradiating, he tells me that this is not similar to his prior chest pain started required heart surgery.   ED Course: Stable vitals.  Unremarkable CBC BMP.  Troponin less than 0.032.  Glucose 263.  UA shows glucose and small hemoglobin.  Portable chest x-ray lingula atelectasis or pneumonia which has developed since previous study.  Stable low-grade CHF. Started on IV ceftriaxone and azithromycin.  Review of Systems: As per HPI all other systems reviewed and negative.  Past Medical History:  Diagnosis Date  . Diabetes mellitus without complication (HCC)   . GERD (gastroesophageal reflux disease)   . Hypertension   . Paranoid schizophrenia (HCC)   . Stroke (HCC)   . Tourette disorder     Past Surgical History:  Procedure Laterality Date  . CARDIAC SURGERY    . CORONARY ARTERY BYPASS GRAFT    . HAND SURGERY    . TONSILLECTOMY       reports that he has never smoked. He has never used smokeless tobacco. He reports that he drank alcohol. He reports that he has current or past drug history.  Allergies  Allergen Reactions  . Keflex [Cephalexin] Rash    Heart attack mother and Father.  Prior to Admission medications   Medication Sig Start Date End Date Taking? Authorizing Provider  acetaminophen  (TYLENOL) 500 MG tablet Take 500 mg by mouth every 4 (four) hours as needed for mild pain or fever.    Yes [provider]  alum & mag hydroxide-simeth (MINTOX REGULAR STRENGTH) 200-200-20 MG/5ML suspension Take 15-30 mLs by mouth as needed for indigestion or heartburn.   Yes [provider]  amLODipine (NORVASC) 10 MG tablet Take 10 mg by mouth daily.   Yes [provider]  aspirin EC 325 MG tablet Take 325 mg by mouth daily.   Yes [provider]  atorvastatin (LIPITOR) 80 MG tablet Take 80 mg by mouth at bedtime.    Yes [provider]  benztropine (COGENTIN) 0.5 MG tablet Take 0.5 mg by mouth 2 (two) times daily.   Yes [provider]  carvedilol (COREG) 6.25 MG tablet Take 6.25 mg by mouth 2 (two) times daily.    Yes [provider]  clopidogrel (PLAVIX) 75 MG tablet Take 75 mg by mouth daily.   Yes [provider]  divalproex (DEPAKOTE ER) 500 MG 24 hr tablet Take 1,000 mg by mouth daily. At 1600   Yes [provider]  ezetimibe (ZETIA) 10 MG tablet Take 10 mg by mouth daily. 08/24/18  Yes [provider]  furosemide (LASIX) 40 MG tablet Take 1 tablet (40 mg total) by mouth 2 (two) times daily. 08/17/18  Yes Enid Baas, MD  gabapentin (NEURONTIN) 300 MG capsule Take 300 mg by mouth 3 (three) times daily.   Yes [provider]  glipiZIDE (GLUCOTROL) 5 MG tablet Take 5 mg by mouth daily.    Yes [provider]  guaiFENesin (ROBITUSSIN) 100 MG/5ML SOLN Take 10 mLs by mouth every 6 (six) hours as needed for cough.    Yes [provider]  insulin lispro (HUMALOG) 100 UNIT/ML injection Inject 57 Units into the skin 3 (three) times daily before meals.   Yes [provider]  isosorbide mononitrate (IMDUR) 60 MG 24 hr tablet Take 60 mg by mouth daily.   Yes [provider]  loperamide (IMODIUM) 2 MG capsule Take 2 mg by mouth daily as needed for diarrhea or loose  stools.    Yes [provider]  LORazepam (ATIVAN) 0.5 MG tablet Take 0.5 mg by mouth 3 (three) times daily as needed for anxiety.    Yes [provider]  magnesium hydroxide (MILK OF MAGNESIA) 400 MG/5ML suspension Take 30 mLs by mouth at bedtime as needed for mild constipation.    Yes [provider]  metFORMIN (GLUCOPHAGE) 1000 MG tablet Take 1,000 mg by mouth 2 (two) times daily.    Yes [provider]  metolazone (ZAROXOLYN) 2.5 MG tablet Take 2.5 mg by mouth daily.   Yes [provider]  neomycin-bacitracin-polymyxin (NEOSPORIN) [phone] ointment Apply 1 application topically as needed (for minor cuts and abrasions).    Yes [provider]  nitroGLYCERIN (NITROSTAT) 0.4 MG SL tablet Place 0.4 mg under the tongue every 5 (five) minutes as needed for chest pain.   Yes [provider]  Pimozide 1 MG TABS Take 1 mg by mouth 2 (two) times daily.   Yes [provider]  potassium chloride 20 MEQ TBCR Take 20 mEq by mouth 2 (two) times daily. 08/17/18  Yes Enid Baas, MD  risperiDONE (RISPERDAL) 1 MG tablet Take 1.5 mg by mouth 2 (two) times daily.    Yes [provider]  tamsulosin (FLOMAX) 0.4 MG CAPS capsule Take 0.4 mg by mouth daily.    Yes [provider]  TRESIBA FLEXTOUCH 100 UNIT/ML SOPN FlexTouch Pen  09/17/18  Yes [provider]  TRUE METRIX BLOOD GLUCOSE TEST test strip  10/05/18  Yes [provider]  Vitamin D, Ergocalciferol, (DRISDOL) 50000 units CAPS capsule Take 50,000 Units by mouth every 7 (seven) days.   Yes [provider]  doxycycline (VIBRAMYCIN) 100 MG capsule Take 1 capsule (100 mg total) by mouth 2 (two) times daily. 10/14/18   Donnetta Hutching, MD  insulin glargine (LANTUS) 100 UNIT/ML injection Inject 28-100 Units into the skin See admin instructions. 28 units every morning and 100 units every evening    [provider]  meclizine (ANTIVERT) 25  MG tablet Take 1 tablet (25 mg total) by mouth 2 (two) times daily. Patient not taking: Reported on 10/14/2018 08/26/18   Shaune Pollack, MD    Physical Exam: Vitals:   10/14/18 1030 10/14/18 1100 10/14/18 1229 10/14/18 1230  BP: (!) 156/60 (!) 128/118 (!) 151/97 (!) 150/124  Pulse: 63 70 78   Resp: 18 (!) 27 18   Temp:      TempSrc:      SpO2: 94% 98% 98%   Weight:      Height:        Constitutional: NAD, calm, comfortable Vitals:   10/14/18 1030 10/14/18 1100 10/14/18 1229 10/14/18 1230  BP: (!) 156/60 (!) 128/118 (!) 151/97 (!) 150/124  Pulse: 63 70 78   Resp: 18 (!) 27 18   Temp:  TempSrc:      SpO2: 94% 98% 98%   Weight:      Height:       Eyes: PERRL, lids and conjunctivae normal ENMT: Mucous membranes are moist. Posterior pharynx clear of any exudate or lesions.  Neck: normal, supple, no masses, no thyromegaly Respiratory: clear to auscultation bilaterally, no wheezing, no crackles. Normal respiratory effort. No accessory muscle use.  Cardiovascular: Regular rate and rhythm, no murmurs / rubs / gallops. No extremity edema. 2+ pedal pulses. No carotid bruits.  Abdomen: no tenderness, no masses palpated. No hepatosplenomegaly. Bowel sounds positive.  Musculoskeletal: no clubbing / cyanosis. No joint deformity upper and lower extremities. Good ROM, no contractures. Normal muscle tone.  Skin: no rashes, lesions, ulcers. No induration Neurologic: CN 2-12 grossly intact.  Strength 5/5 in all 4.  Psychiatric: Normal judgment and insight. Alert and oriented x 3. Normal mood.   Labs on Admission: I have personally reviewed following labs and imaging studies  CBC: Recent Labs  Lab 10/14/18 0952  WBC 8.9  NEUTROABS 6.2  HGB 12.9*  HCT 39.2  MCV 84.7  PLT 220   Basic Metabolic Panel: Recent Labs  Lab 10/14/18 0952  NA 135  K 3.4*  CL 96*  CO2 28  GLUCOSE 263*  BUN 17  CREATININE 1.09  CALCIUM 9.3   GFR: Estimated Creatinine Clearance: 96 mL/min (by C-G  formula based on SCr of 1.09 mg/dL). Liver Function Tests: Recent Labs  Lab 10/14/18 0952  AST 17  ALT 16  ALKPHOS 52  BILITOT 0.6  PROT 7.1  ALBUMIN 3.7   Recent Labs  Lab 10/14/18 0952  LIPASE 24   Cardiac Enzymes: Recent Labs  Lab 10/14/18 0952  TROPONINI <0.03   Urine analysis:    Component Value Date/Time   COLORURINE STRAW (A) 10/14/2018 0915   APPEARANCEUR CLEAR 10/14/2018 0915   LABSPEC 1.006 10/14/2018 0915   PHURINE 7.0 10/14/2018 0915   GLUCOSEU >=500 (A) 10/14/2018 0915   HGBUR SMALL (A) 10/14/2018 0915   BILIRUBINUR NEGATIVE 10/14/2018 0915   KETONESUR 5 (A) 10/14/2018 0915   PROTEINUR NEGATIVE 10/14/2018 0915   NITRITE NEGATIVE 10/14/2018 0915   LEUKOCYTESUR NEGATIVE 10/14/2018 0915    Radiological Exams on Admission: Dg Chest Port 1 View  Result Date: 10/14/2018 CLINICAL DATA:  Weakness EXAM: PORTABLE CHEST 1 VIEW COMPARISON:  Portable chest x-ray of August 25, 2018 FINDINGS: The right lung is well-expanded. On the left there is hazy increased density in the lower hemithorax with partial obscuration of the hemidiaphragm. The left heart border is obscured. The pulmonary vascularity is mildly prominent centrally but stable. The sternal wires are intact. IMPRESSION: Findings worrisome for lingular atelectasis or pneumonia which has developed since the previous study. Small left pleural effusion. Low-grade CHF which is stable. Electronically Signed   By: David  [place] M.D.   On: 10/14/2018 09:48    EKG: Independently reviewed.  Right bundle branch block- old..  Prolonged PR interval.  Prior EKG shows atrial fibrillation.  Assessment/Plan Principal Problem:   Community acquired pneumonia Active Problems:   Paranoid schizophrenia (HCC)   Diabetes mellitus without complication (HCC)   Tourette disorder   Intellectual disability    Pneumonia- SOB, Cough. WBC- 8.9.  Lingular atelectasis or pneumonia, small left pleural effusion, stable low-grade CHF.    -Continue IV ceftriaxone (2g daily for weight) and azithromycin started in the ED -Mucolytics  Chest pain hx of coronary artery disease with CABG-troponin x 2 negative.  EKG no ST  or T wave abnormalities, old RBBB. Not present at the time of my eval.  -Trend troponin -EKG a.m. - Echo -Continue home aspirin 325, Plavix, Coreg, Lipitor  CAD, CABG, CHF- per care everywhere, DES to Lcx + LcxOM2 2006, 4v CABG 10/2011 ([facility]).  - ECHO - Continue home aspirin 325, Plavix, Coreg, Lipitor, imdur -Continue home Lasix, metolazone  DM-glucose 263. -Continue meal coverage insulin at reduced dose 15 units 3 times daily, glipizide - SSI  Schizophrenia, intellectual disability, Tourette's disorder -Continue home primozide, benztropine, risperidone, Depakote   DVT prophylaxis: Lovenox Code Status: Full Family Communication: None at bedside Disposition Plan: per rounding team Consults called: None Admission status: obs, tele   Onnie BoerEjiroghene E Sang Blount MD [facility] Pager [phone] From 3PM-11PM.  Otherwise please contact night-coverage [email or web address] Password [email or web address]  10/14/2018, 9:39 PM

## 2018-10-14 NOTE — ED Notes (Signed)
Attempted report to Reuel Boomaniel, Charity fundraiserN. Daniel unavailable.

## 2018-10-14 NOTE — ED Triage Notes (Addendum)
Pt brought in by [facility] from [facility] with generalized weakness, diarrhea, excessive urination x 2 days. Denies dysuria, vomiting and abdominal pain. Pt reports he started to become SOB this morning and he fell going to the bathroom. Pt denies LOC and injury upon fall. CBG 276, BP 150/100 for EMS.

## 2018-10-15 ENCOUNTER — Other Ambulatory Visit (HOSPITAL_COMMUNITY): Payer: Self-pay

## 2018-10-15 ENCOUNTER — Other Ambulatory Visit (HOSPITAL_COMMUNITY): Payer: Self-pay | Admitting: Family Medicine

## 2018-10-15 DIAGNOSIS — J189 Pneumonia, unspecified organism: Secondary | ICD-10-CM | POA: Diagnosis not present

## 2018-10-15 DIAGNOSIS — F952 Tourette's disorder: Secondary | ICD-10-CM

## 2018-10-15 DIAGNOSIS — F79 Unspecified intellectual disabilities: Secondary | ICD-10-CM

## 2018-10-15 DIAGNOSIS — F200 Paranoid schizophrenia: Secondary | ICD-10-CM | POA: Diagnosis not present

## 2018-10-15 DIAGNOSIS — E119 Type 2 diabetes mellitus without complications: Secondary | ICD-10-CM

## 2018-10-15 LAB — GLUCOSE, CAPILLARY
GLUCOSE-CAPILLARY: 176 mg/dL — AB (ref 70–99)
Glucose-Capillary: 266 mg/dL — ABNORMAL HIGH (ref 70–99)

## 2018-10-15 MED ORDER — LEVOFLOXACIN 750 MG PO TABS
750.0000 mg | ORAL_TABLET | Freq: Every day | ORAL | Status: DC
  Administered 2018-10-15: 750 mg via ORAL
  Filled 2018-10-15: qty 1

## 2018-10-15 MED ORDER — DM-GUAIFENESIN ER 30-600 MG PO TB12: 1.0000 | ORAL_TABLET | Freq: Two times a day (BID) | ORAL | 0 refills | Status: AC

## 2018-10-15 MED ORDER — LEVOFLOXACIN 750 MG PO TABS: 750.0000 mg | ORAL_TABLET | Freq: Every day | ORAL | 0 refills | Status: AC

## 2018-10-15 NOTE — Discharge Summary (Signed)
Physician Discharge Summary  Jason [name] ZOX:[id number] DOB: 22-Oct-1951 DOA: 10/14/2018  PCP: Patient, No Pcp Per  Admit date: 10/14/2018 Discharge date: 10/15/2018  Admitted From:  Group Home  Disposition: Group Home  Recommendations for Outpatient Follow-up:  1. Follow up with primary care provider in 1 weeks 2. Please obtain repeat chest x-ray in 4-6 weeks to ensure full resolution of pneumonia.  Discharge Condition: STABLE   CODE STATUS: FULL    Brief Hospitalization Summary: Please see all hospital notes, images, labs for full details of the hospitalization. Dr. Fredirick Lathe HPI: Jason [name] is a 67 y.o. male with medical history significant for CAD with CABG, Tourette's, intellectual disability, schizophrenia, HTN, DM was brought to the ED with complaints of shortness of breath and dry cough that started today.,  With generalized weakness and frequent urination for the past 2 days.  Reports of diarrhea per chart but patient denies, and no loose stools today in the ED. Patient denies abdominal pain.  Denies dysuria. While in the ED patient reported chest pain-right-sided, nonradiating, he tells me that this is not similar to his prior chest pain started required heart surgery.   ED Course: Stable vitals.  Unremarkable CBC BMP.  Troponin less than 0.032.  Glucose 263.  UA shows glucose and small hemoglobin.  Portable chest x-ray lingula atelectasis or pneumonia which has developed since previous study.  Stable low-grade CHF. Started on IV ceftriaxone and azithromycin.  The patient was admitted to the hospital under observation because he had complained of some chest pain symptoms.  Because of his history of CAD he was admitted for serial troponin tests which all came back less than 0.03.  The patient felt much better after pneumonia treatment was started.  He tolerated his antibiotics very well.  He remained 99-100% on room air.  He had no significant cough or shortness of breath  and his chest pain symptoms seem to have resolved.  He is being discharged back to his group home.  He will be discharged on levofloxacin 750 mg daily for full 5-day course.  No changes were made to his chronic medications.  He should follow up with his primary care provider in 1 week for hospital follow up and recheck.  He should follow up with his other specialty providers as scheduled.  Seek medical care or return to ER if symptoms come back, worsen or new problems develop.    Discharge Diagnoses:  Principal Problem:   Community acquired pneumonia Active Problems:   Paranoid schizophrenia (HCC)   Diabetes mellitus without complication (HCC)   Tourette disorder   Intellectual disability  Discharge Instructions: Discharge Instructions    Call MD for:  difficulty breathing, headache or visual disturbances   Complete by:  As directed    Call MD for:  extreme fatigue   Complete by:  As directed    Call MD for:  persistant dizziness or light-headedness   Complete by:  As directed    Call MD for:  persistant nausea and vomiting   Complete by:  As directed    Call MD for:  severe uncontrolled pain   Complete by:  As directed      Allergies as of 10/15/2018      Reactions   Keflex [cephalexin] Rash      Medication List    STOP taking these medications   insulin glargine 100 UNIT/ML injection Commonly known as:  LANTUS   meclizine 25 MG tablet Commonly known as:  [employer]  TAKE these medications   acetaminophen 500 MG tablet Commonly known as:  TYLENOL Take 500 mg by mouth every 4 (four) hours as needed for mild pain or fever.   amLODipine 10 MG tablet Commonly known as:  NORVASC Take 10 mg by mouth daily.   aspirin EC 325 MG tablet Take 325 mg by mouth daily.   atorvastatin 80 MG tablet Commonly known as:  LIPITOR Take 80 mg by mouth at bedtime.   benztropine 0.5 MG tablet Commonly known as:  COGENTIN Take 0.5 mg by mouth 2 (two) times daily.   carvedilol 6.25  MG tablet Commonly known as:  COREG Take 6.25 mg by mouth 2 (two) times daily.   clopidogrel 75 MG tablet Commonly known as:  PLAVIX Take 75 mg by mouth daily.   dextromethorphan-guaiFENesin 30-600 MG 12hr tablet Commonly known as:  MUCINEX DM Take 1 tablet by mouth 2 (two) times daily for 5 days.   divalproex 500 MG 24 hr tablet Commonly known as:  DEPAKOTE ER Take 1,000 mg by mouth daily. At 1600   ezetimibe 10 MG tablet Commonly known as:  ZETIA Take 10 mg by mouth daily.   furosemide 40 MG tablet Commonly known as:  LASIX Take 1 tablet (40 mg total) by mouth 2 (two) times daily.   gabapentin 300 MG capsule Commonly known as:  NEURONTIN Take 300 mg by mouth 3 (three) times daily.   glipiZIDE 5 MG tablet Commonly known as:  GLUCOTROL Take 5 mg by mouth daily.   guaiFENesin 100 MG/5ML Soln Commonly known as:  ROBITUSSIN Take 10 mLs by mouth every 6 (six) hours as needed for cough.   insulin lispro 100 UNIT/ML injection Commonly known as:  HUMALOG Inject 57 Units into the skin 3 (three) times daily before meals.   isosorbide mononitrate 60 MG 24 hr tablet Commonly known as:  IMDUR Take 60 mg by mouth daily.   levofloxacin 750 MG tablet Commonly known as:  LEVAQUIN Take 1 tablet (750 mg total) by mouth daily for 5 days.   loperamide 2 MG capsule Commonly known as:  IMODIUM Take 2 mg by mouth daily as needed for diarrhea or loose stools.   LORazepam 0.5 MG tablet Commonly known as:  ATIVAN Take 0.5 mg by mouth 3 (three) times daily as needed for anxiety.   magnesium hydroxide 400 MG/5ML suspension Commonly known as:  MILK OF MAGNESIA Take 30 mLs by mouth at bedtime as needed for mild constipation.   metFORMIN 1000 MG tablet Commonly known as:  GLUCOPHAGE Take 1,000 mg by mouth 2 (two) times daily.   metolazone 2.5 MG tablet Commonly known as:  ZAROXOLYN Take 2.5 mg by mouth daily.   MINTOX REGULAR STRENGTH 200-200-20 MG/5ML suspension Generic drug:   alum & mag hydroxide-simeth Take 15-30 mLs by mouth as needed for indigestion or heartburn.   neomycin-bacitracin-polymyxin [phone] ointment Apply 1 application topically as needed (for minor cuts and abrasions).   nitroGLYCERIN 0.4 MG SL tablet Commonly known as:  NITROSTAT Place 0.4 mg under the tongue every 5 (five) minutes as needed for chest pain.   Pimozide 1 MG Tabs Take 1 mg by mouth 2 (two) times daily.   Potassium Chloride ER 20 MEQ Tbcr Take 20 mEq by mouth 2 (two) times daily.   risperiDONE 1 MG tablet Commonly known as:  RISPERDAL Take 1.5 mg by mouth 2 (two) times daily.   tamsulosin 0.4 MG Caps capsule Commonly known as:  FLOMAX Take 0.4 mg by  mouth daily.   TRESIBA FLEXTOUCH 100 UNIT/ML Sopn FlexTouch Pen Generic drug:  insulin degludec   TRUE METRIX BLOOD GLUCOSE TEST test strip Generic drug:  glucose blood   Vitamin D (Ergocalciferol) 1.25 MG (50000 UT) Caps capsule Commonly known as:  DRISDOL Take 50,000 Units by mouth every 7 (seven) days.      Contact information for after-discharge care    Destination    [facility] .   Service:  Group Home Contact information: [address] [place] [address] [phone]             Allergies  Allergen Reactions  . Keflex [Cephalexin] Rash   Allergies as of 10/15/2018      Reactions   Keflex [cephalexin] Rash      Medication List    STOP taking these medications   insulin glargine 100 UNIT/ML injection Commonly known as:  LANTUS   meclizine 25 MG tablet Commonly known as:  ANTIVERT     TAKE these medications   acetaminophen 500 MG tablet Commonly known as:  TYLENOL Take 500 mg by mouth every 4 (four) hours as needed for mild pain or fever.   amLODipine 10 MG tablet Commonly known as:  NORVASC Take 10 mg by mouth daily.   aspirin EC 325 MG tablet Take 325 mg by mouth daily.   atorvastatin 80 MG tablet Commonly known as:  LIPITOR Take 80 mg by  mouth at bedtime.   benztropine 0.5 MG tablet Commonly known as:  COGENTIN Take 0.5 mg by mouth 2 (two) times daily.   carvedilol 6.25 MG tablet Commonly known as:  COREG Take 6.25 mg by mouth 2 (two) times daily.   clopidogrel 75 MG tablet Commonly known as:  PLAVIX Take 75 mg by mouth daily.   dextromethorphan-guaiFENesin 30-600 MG 12hr tablet Commonly known as:  MUCINEX DM Take 1 tablet by mouth 2 (two) times daily for 5 days.   divalproex 500 MG 24 hr tablet Commonly known as:  DEPAKOTE ER Take 1,000 mg by mouth daily. At 1600   ezetimibe 10 MG tablet Commonly known as:  ZETIA Take 10 mg by mouth daily.   furosemide 40 MG tablet Commonly known as:  LASIX Take 1 tablet (40 mg total) by mouth 2 (two) times daily.   gabapentin 300 MG capsule Commonly known as:  NEURONTIN Take 300 mg by mouth 3 (three) times daily.   glipiZIDE 5 MG tablet Commonly known as:  GLUCOTROL Take 5 mg by mouth daily.   guaiFENesin 100 MG/5ML Soln Commonly known as:  ROBITUSSIN Take 10 mLs by mouth every 6 (six) hours as needed for cough.   insulin lispro 100 UNIT/ML injection Commonly known as:  HUMALOG Inject 57 Units into the skin 3 (three) times daily before meals.   isosorbide mononitrate 60 MG 24 hr tablet Commonly known as:  IMDUR Take 60 mg by mouth daily.   levofloxacin 750 MG tablet Commonly known as:  LEVAQUIN Take 1 tablet (750 mg total) by mouth daily for 5 days.   loperamide 2 MG capsule Commonly known as:  IMODIUM Take 2 mg by mouth daily as needed for diarrhea or loose stools.   LORazepam 0.5 MG tablet Commonly known as:  ATIVAN Take 0.5 mg by mouth 3 (three) times daily as needed for anxiety.   magnesium hydroxide 400 MG/5ML suspension Commonly known as:  MILK OF MAGNESIA Take 30 mLs by mouth at bedtime as needed for mild constipation.  metFORMIN 1000 MG tablet Commonly known as:  GLUCOPHAGE Take 1,000 mg by mouth 2 (two) times daily.   metolazone 2.5 MG  tablet Commonly known as:  ZAROXOLYN Take 2.5 mg by mouth daily.   MINTOX REGULAR STRENGTH 200-200-20 MG/5ML suspension Generic drug:  alum & mag hydroxide-simeth Take 15-30 mLs by mouth as needed for indigestion or heartburn.   neomycin-bacitracin-polymyxin [phone] ointment Apply 1 application topically as needed (for minor cuts and abrasions).   nitroGLYCERIN 0.4 MG SL tablet Commonly known as:  NITROSTAT Place 0.4 mg under the tongue every 5 (five) minutes as needed for chest pain.   Pimozide 1 MG Tabs Take 1 mg by mouth 2 (two) times daily.   Potassium Chloride ER 20 MEQ Tbcr Take 20 mEq by mouth 2 (two) times daily.   risperiDONE 1 MG tablet Commonly known as:  RISPERDAL Take 1.5 mg by mouth 2 (two) times daily.   tamsulosin 0.4 MG Caps capsule Commonly known as:  FLOMAX Take 0.4 mg by mouth daily.   TRESIBA FLEXTOUCH 100 UNIT/ML Sopn FlexTouch Pen Generic drug:  insulin degludec   TRUE METRIX BLOOD GLUCOSE TEST test strip Generic drug:  glucose blood   Vitamin D (Ergocalciferol) 1.25 MG (50000 UT) Caps capsule Commonly known as:  DRISDOL Take 50,000 Units by mouth every 7 (seven) days.       Procedures/Studies: Dg Chest Port 1 View  Result Date: 10/14/2018 CLINICAL DATA:  Weakness EXAM: PORTABLE CHEST 1 VIEW COMPARISON:  Portable chest x-ray of August 25, 2018 FINDINGS: The right lung is well-expanded. On the left there is hazy increased density in the lower hemithorax with partial obscuration of the hemidiaphragm. The left heart border is obscured. The pulmonary vascularity is mildly prominent centrally but stable. The sternal wires are intact. IMPRESSION: Findings worrisome for lingular atelectasis or pneumonia which has developed since the previous study. Small left pleural effusion. Low-grade CHF which is stable. Electronically Signed   By: David  SwazilandJordan M.D.   On: 10/14/2018 09:48      Subjective: Patient awake, alert, reporting that he is feeling  better today.  He has no specific complaints today.  Discharge Exam: Vitals:   10/14/18 2105 10/15/18 0553  BP: (!) 144/82 (!) 157/74  Pulse: 64 60  Resp: (!) 24 (!) 21  Temp: 97.6 F (36.4 C) 97.9 F (36.6 C)  SpO2: 98% 100%   Vitals:   10/14/18 1704 10/14/18 2031 10/14/18 2105 10/15/18 0553  BP: 137/74  (!) 144/82 (!) 157/74  Pulse: 71  64 60  Resp: 18  (!) 24 (!) 21  Temp: 98.2 F (36.8 C)  97.6 F (36.4 C) 97.9 F (36.6 C)  TempSrc: Oral  Oral Oral  SpO2: 94% 94% 98% 100%  Weight:      Height:       General: Pt is alert, awake, not in acute distress Cardiovascular: RRR, S1/S2 +, no rubs, no gallops Respiratory: CTA bilaterally, no wheezing, no rhonchi, no rales.  Abdominal: Soft, NT, ND, bowel sounds + Extremities: no cyanosis   The results of significant diagnostics from this hospitalization (including imaging, microbiology, ancillary and laboratory) are listed below for reference.     Microbiology: No results found for this or any previous visit (from the past 240 hour(s)).   Labs: BNP (last 3 results) No results for input(s): BNP in the last 8760 hours. Basic Metabolic Panel: Recent Labs  Lab 10/14/18 0952  NA 135  K 3.4*  CL 96*  CO2 28  GLUCOSE 263*  BUN 17  CREATININE 1.09  CALCIUM 9.3   Liver Function Tests: Recent Labs  Lab 10/14/18 0952  AST 17  ALT 16  ALKPHOS 52  BILITOT 0.6  PROT 7.1  ALBUMIN 3.7   Recent Labs  Lab 10/14/18 0952  LIPASE 24   No results for input(s): AMMONIA in the last 168 hours. CBC: Recent Labs  Lab 10/14/18 0952  WBC 8.9  NEUTROABS 6.2  HGB 12.9*  HCT 39.2  MCV 84.7  PLT 220   Cardiac Enzymes: Recent Labs  Lab 10/14/18 0952 10/14/18 1554 10/14/18 2134  TROPONINI <0.03 <0.03 <0.03   BNP: Invalid input(s): POCBNP CBG: Recent Labs  Lab 10/14/18 1706 10/14/18 2109 10/15/18 0718  GLUCAP 244* 184* 176*   D-Dimer No results for input(s): DDIMER in the last 72 hours. Hgb A1c No results  for input(s): HGBA1C in the last 72 hours. Lipid Profile No results for input(s): CHOL, HDL, LDLCALC, TRIG, CHOLHDL, LDLDIRECT in the last 72 hours. Thyroid function studies No results for input(s): TSH, T4TOTAL, T3FREE, THYROIDAB in the last 72 hours.  Invalid input(s): FREET3 Anemia work up No results for input(s): VITAMINB12, FOLATE, FERRITIN, TIBC, IRON, RETICCTPCT in the last 72 hours. Urinalysis    Component Value Date/Time   COLORURINE STRAW (A) 10/14/2018 0915   APPEARANCEUR CLEAR 10/14/2018 0915   LABSPEC 1.006 10/14/2018 0915   PHURINE 7.0 10/14/2018 0915   GLUCOSEU >=500 (A) 10/14/2018 0915   HGBUR SMALL (A) 10/14/2018 0915   BILIRUBINUR NEGATIVE 10/14/2018 0915   KETONESUR 5 (A) 10/14/2018 0915   PROTEINUR NEGATIVE 10/14/2018 0915   NITRITE NEGATIVE 10/14/2018 0915   LEUKOCYTESUR NEGATIVE 10/14/2018 0915   Sepsis Labs Invalid input(s): PROCALCITONIN,  WBC,  LACTICIDVEN Microbiology No results found for this or any previous visit (from the past 240 hour(s)).  Time coordinating discharge:   SIGNED:  Standley Dakins, MD  [facility] 10/15/2018, 10:36 AM Pager [phone]  If 7PM-7AM, please contact night-coverage [email or web address] Password [email or web address]

## 2018-10-15 NOTE — Plan of Care (Signed)
Adequate for discharge.

## 2018-10-15 NOTE — Clinical Social Work Note (Addendum)
Patient is from [facility].  He has been at the facility for about 7 months. LCSW spoke with Stanton KidneyDebra at the facility and advised of discharge today. LCSW advised that patient would not have another FL2 because he was in observation for less than 24 hours.   Stanton KidneyDebra stated that they would need EMS to transport patient back to the facility because the owners were out of town. LCSW discussed that patient did not meet medical necessity for EMS transport.  Stanton KidneyDebra to call owners develop a transportation plan.  Abygail Galeno, Juleen ChinaHeather D, LCSW

## 2018-10-15 NOTE — Clinical Social Work Note (Signed)
Debra at facility indicates that she has not been about to reach anyone who can transport patient back to the facility. LCSW arranged EMS.   Baldo Hufnagle, Juleen ChinaHeather D, LCSW

## 2018-10-19 ENCOUNTER — Other Ambulatory Visit: Payer: Self-pay

## 2018-10-19 ENCOUNTER — Emergency Department
Admission: EM | Admit: 2018-10-19 | Discharge: 2018-10-19 | Disposition: A | Discharge status: Home / Self Care | Payer: Medicare Other | Attending: Emergency Medicine | Admitting: Emergency Medicine

## 2018-10-19 ENCOUNTER — Emergency Department: Payer: Medicare Other

## 2018-10-19 DIAGNOSIS — Z8673 Personal history of transient ischemic attack (TIA), and cerebral infarction without residual deficits: Secondary | ICD-10-CM | POA: Diagnosis not present

## 2018-10-19 DIAGNOSIS — Z7901 Long term (current) use of anticoagulants: Secondary | ICD-10-CM | POA: Diagnosis not present

## 2018-10-19 DIAGNOSIS — F200 Paranoid schizophrenia: Secondary | ICD-10-CM | POA: Diagnosis not present

## 2018-10-19 DIAGNOSIS — I10 Essential (primary) hypertension: Secondary | ICD-10-CM | POA: Insufficient documentation

## 2018-10-19 DIAGNOSIS — E119 Type 2 diabetes mellitus without complications: Secondary | ICD-10-CM | POA: Insufficient documentation

## 2018-10-19 DIAGNOSIS — Z79899 Other long term (current) drug therapy: Secondary | ICD-10-CM | POA: Diagnosis not present

## 2018-10-19 DIAGNOSIS — F419 Anxiety disorder, unspecified: Secondary | ICD-10-CM | POA: Diagnosis not present

## 2018-10-19 DIAGNOSIS — Z7984 Long term (current) use of oral hypoglycemic drugs: Secondary | ICD-10-CM | POA: Diagnosis not present

## 2018-10-19 DIAGNOSIS — R0789 Other chest pain: Secondary | ICD-10-CM | POA: Diagnosis present

## 2018-10-19 LAB — CBC WITH DIFFERENTIAL/PLATELET
Abs Immature Granulocytes: 0.06 10*3/uL (ref 0.00–0.07)
BASOS PCT: 1 %
Basophils Absolute: 0 10*3/uL (ref 0.0–0.1)
Eosinophils Absolute: 0.5 10*3/uL (ref 0.0–0.5)
Eosinophils Relative: 6 %
HCT: 38.2 % — ABNORMAL LOW (ref 39.0–52.0)
Hemoglobin: 12.8 g/dL — ABNORMAL LOW (ref 13.0–17.0)
IMMATURE GRANULOCYTES: 1 %
Lymphocytes Relative: 15 %
Lymphs Abs: 1.2 10*3/uL (ref 0.7–4.0)
MCH: 27.9 pg (ref 26.0–34.0)
MCHC: 33.5 g/dL (ref 30.0–36.0)
MCV: 83.4 fL (ref 80.0–100.0)
Monocytes Absolute: 0.8 10*3/uL (ref 0.1–1.0)
Monocytes Relative: 10 %
NEUTROS ABS: 5.3 10*3/uL (ref 1.7–7.7)
NEUTROS PCT: 67 %
PLATELETS: 206 10*3/uL (ref 150–400)
RBC: 4.58 MIL/uL (ref 4.22–5.81)
RDW: 14.6 % (ref 11.5–15.5)
WBC: 7.8 10*3/uL (ref 4.0–10.5)
nRBC: 0 % (ref 0.0–0.2)

## 2018-10-19 LAB — URINALYSIS, COMPLETE (UACMP) WITH MICROSCOPIC
BILIRUBIN URINE: NEGATIVE
Bacteria, UA: NONE SEEN
KETONES UR: 5 mg/dL — AB
LEUKOCYTES UA: NEGATIVE
Nitrite: NEGATIVE
PH: 5 (ref 5.0–8.0)
PROTEIN: 30 mg/dL — AB
Specific Gravity, Urine: 1.015 (ref 1.005–1.030)
Squamous Epithelial / LPF: NONE SEEN (ref 0–5)

## 2018-10-19 LAB — TROPONIN I
Troponin I: <0.03 ng/mL (ref <0.03)
Troponin I: <0.03 ng/mL (ref <0.03)

## 2018-10-19 LAB — COMPREHENSIVE METABOLIC PANEL
ALBUMIN: 3.7 g/dL (ref 3.5–5.0)
ALT: 20 U/L (ref 0–44)
ANION GAP: 10 (ref 5–15)
AST: 23 U/L (ref 15–41)
Alkaline Phosphatase: 61 U/L (ref 38–126)
BUN: 15 mg/dL (ref 8–23)
CHLORIDE: 99 mmol/L (ref 98–111)
CO2: 28 mmol/L (ref 22–32)
Calcium: 9.2 mg/dL (ref 8.9–10.3)
Creatinine, Ser: 0.98 mg/dL (ref 0.61–1.24)
GFR calc Af Amer: >60 mL/min (ref >60)
GFR calc non Af Amer: >60 mL/min (ref >60)
GLUCOSE: 231 mg/dL — AB (ref 70–99)
POTASSIUM: 3.6 mmol/L (ref 3.5–5.1)
Sodium: 137 mmol/L (ref 135–145)
Total Bilirubin: 0.5 mg/dL (ref 0.3–1.2)
Total Protein: 6.9 g/dL (ref 6.5–8.1)

## 2018-10-19 LAB — URINE DRUG SCREEN, QUALITATIVE (ARMC ONLY)
AMPHETAMINES, UR SCREEN: NOT DETECTED
BENZODIAZEPINE, UR SCRN: POSITIVE — AB
Barbiturates, Ur Screen: NOT DETECTED
Cannabinoid 50 Ng, Ur ~~LOC~~: NOT DETECTED
Cocaine Metabolite,Ur ~~LOC~~: NOT DETECTED
MDMA (Ecstasy)Ur Screen: NOT DETECTED
METHADONE SCREEN, URINE: NOT DETECTED
OPIATE, UR SCREEN: NOT DETECTED
Phencyclidine (PCP) Ur S: NOT DETECTED
Tricyclic, Ur Screen: NOT DETECTED

## 2018-10-19 LAB — ETHANOL

## 2018-10-19 LAB — VALPROIC ACID LEVEL: Valproic Acid Lvl: 22 ug/mL — ABNORMAL LOW (ref 50.0–100.0)

## 2018-10-19 LAB — PROCALCITONIN: Procalcitonin: <0.1 ng/mL

## 2018-10-19 MED ORDER — LORAZEPAM 0.5 MG PO TABS
0.5000 mg | ORAL_TABLET | Freq: Once | ORAL | Status: AC
  Administered 2018-10-19: 0.5 mg via ORAL
  Filled 2018-10-19: qty 1

## 2018-10-19 NOTE — Discharge Instructions (Addendum)
The blood work chest x-ray and exam that we did here shows that you do not need antibiotics at present.  The doses of antibiotics that she got in the hospital seem to be good enough to have treated the problem.  Please follow-up with your regular doctor or return here as needed.

## 2018-10-19 NOTE — Progress Notes (Signed)
Clinical Social Worker (CSW) received consult that patient does not like his group home. Per MD medical work up is pending. CSW made MD aware that patient will have to discharge back to his group home from the ED if there is no medical reason to admit him and that ED staff should call patient's guardian listed as Jyl HeinzChavis about his medical status. MD and RN aware of above.   Baker Hughes IncorporatedBailey Clariece Roesler, LCSW [phone]

## 2018-10-19 NOTE — ED Notes (Signed)
Report to include Situation, Background, Assessment, and Recommendations received from Amy Teague RN. Patient alert, warm and dry, in no acute distress. Patient denies SI, HI, AVH and pain. Patient made aware of Q15 minute rounds and Rover and Officer presence for their safety. Patient instructed to come to me with needs or concerns.  

## 2018-10-19 NOTE — ED Notes (Signed)
Hourly rounding reveals patient in room. No complaints, stable, in no acute distress. Q15 minute rounds and monitoring via Rover and Officer to continue.   

## 2018-10-19 NOTE — ED Triage Notes (Signed)
[facility]  [facility]   Pt verbalizes that a caregiver/med tech at the home has been mean to him and he wishes to not live there any more   (her name is Information systems managerDeborah)  Pt denies SI/HI  He denies AH/VH  "I do not like it there - I just came to call my guardian cause they don't let me use the phone"

## 2018-10-19 NOTE — ED Provider Notes (Addendum)
[facility] Emergency Department Provider Note   ____________________________________________   First MD Initiated Contact with Patient 10/19/18 1508     (approximate)  I have reviewed the triage vital signs and the nursing notes.   HISTORY  Chief Complaint Mental Health Problem   HPI Jason [name] is a 67 y.o. male who comes from his group home.  He says he does not want to go back to his group home.  He says the med tech is mad at him.  He said he has not got his antibiotics yet.  He said he went to the hospital couple days ago spent the night there and was diagnosed with pneumonia.  Patient also says he has had chest tightness for the last hour.  He cannot describe it further.  He has had shortness of breath since he got diagnosed with pneumonia.  View of the old records shows he also had diarrhea.This seems to h aVE STOPPED no cough.PMH of schizophrenia  Past Medical History:  Diagnosis Date  . Diabetes mellitus without complication (HCC)   . GERD (gastroesophageal reflux disease)   . Hypertension   . Paranoid schizophrenia (HCC)   . Stroke (HCC)   . Tourette disorder     Patient Active Problem List   Diagnosis Date Noted  . Community acquired pneumonia 10/14/2018  . Dizziness 08/25/2018  . Hypokalemia due to inadequate potassium intake 08/16/2018  . Hypertension 08/07/2018  . Malingering 08/07/2018  . Intellectual disability 08/07/2018  . Paranoid schizophrenia (HCC) 07/23/2018  . Diabetes mellitus without complication (HCC) 07/23/2018  . Tourette disorder 07/23/2018    Past Surgical History:  Procedure Laterality Date  . CARDIAC SURGERY    . CORONARY ARTERY BYPASS GRAFT    . HAND SURGERY    . TONSILLECTOMY      Prior to Admission medications   Medication Sig Start Date End Date Taking? Authorizing Provider  acetaminophen (TYLENOL) 500 MG tablet Take 500 mg by mouth every 4 (four) hours as needed for mild pain or fever.    Yes  [provider]  alum & mag hydroxide-simeth (MINTOX REGULAR STRENGTH) 200-200-20 MG/5ML suspension Take 15-30 mLs by mouth as needed for indigestion or heartburn.   Yes [provider]  amLODipine (NORVASC) 10 MG tablet Take 10 mg by mouth daily.   Yes [provider]  aspirin EC 325 MG tablet Take 325 mg by mouth daily.   Yes [provider]  atorvastatin (LIPITOR) 80 MG tablet Take 80 mg by mouth at bedtime.    Yes [provider]  benztropine (COGENTIN) 0.5 MG tablet Take 0.5 mg by mouth 2 (two) times daily.   Yes [provider]  carvedilol (COREG) 6.25 MG tablet Take 6.25 mg by mouth 2 (two) times daily.    Yes [provider]  clopidogrel (PLAVIX) 75 MG tablet Take 75 mg by mouth daily.   Yes [provider]  divalproex (DEPAKOTE ER) 500 MG 24 hr tablet Take 1,000 mg by mouth daily before supper. At 1600   Yes [provider]  ezetimibe (ZETIA) 10 MG tablet Take 10 mg by mouth daily. 08/24/18  Yes [provider]  furosemide (LASIX) 40 MG tablet Take 1 tablet (40 mg total) by mouth 2 (two) times daily. 08/17/18  Yes Enid Baas, MD  gabapentin (NEURONTIN) 300 MG capsule Take 300 mg by mouth 3 (three) times daily.   Yes [provider]  glipiZIDE (GLUCOTROL) 5 MG tablet Take 5  mg by mouth daily.    Yes [provider]  insulin degludec (TRESIBA) 100 UNIT/ML SOPN FlexTouch Pen Inject 30 Units into the skin at bedtime.   Yes [provider]  insulin lispro (HUMALOG) 100 UNIT/ML injection Inject 57 Units into the skin 3 (three) times daily before meals.   Yes [provider]  isosorbide mononitrate (IMDUR) 60 MG 24 hr tablet Take 60 mg by mouth daily.   Yes [provider]  loperamide (IMODIUM) 2 MG capsule Take 2 mg by mouth daily as needed for diarrhea or loose stools.    Yes [provider]  LORazepam (ATIVAN) 0.5 MG tablet Take 0.5 mg by mouth 3  (three) times daily.    Yes [provider]  magnesium hydroxide (MILK OF MAGNESIA) 400 MG/5ML suspension Take 30 mLs by mouth at bedtime as needed for mild constipation.    Yes [provider]  metFORMIN (GLUCOPHAGE) 1000 MG tablet Take 1,000 mg by mouth 2 (two) times daily.    Yes [provider]  metolazone (ZAROXOLYN) 2.5 MG tablet Take 2.5 mg by mouth daily.   Yes [provider]  neomycin-bacitracin-polymyxin (NEOSPORIN) [phone] ointment Apply 1 application topically as needed (for minor cuts and abrasions).    Yes [provider]  nitroGLYCERIN (NITROSTAT) 0.4 MG SL tablet Place 0.4 mg under the tongue every 5 (five) minutes as needed for chest pain.   Yes [provider]  Pimozide 1 MG TABS Take 1 mg by mouth 2 (two) times daily.   Yes [provider]  potassium chloride 20 MEQ TBCR Take 20 mEq by mouth 2 (two) times daily. Patient taking differently: Take 40 mEq by mouth 2 (two) times daily.  08/17/18  Yes Enid BaasKalisetti, Radhika, MD  pseudoephedrine-dextromethorphan-guaifenesin (ROBITUSSIN-PE) 30-10-100 MG/5ML solution Take 10 mLs by mouth every 6 (six) hours as needed for cough.   Yes [provider]  risperiDONE (RISPERDAL) 1 MG tablet Take 1.5 mg by mouth 2 (two) times daily.    Yes [provider]  tamsulosin (FLOMAX) 0.4 MG CAPS capsule Take 0.4 mg by mouth daily.    Yes [provider]  Vitamin D, Ergocalciferol, (DRISDOL) 50000 units CAPS capsule Take 50,000 Units by mouth every Friday.    Yes [provider]  dextromethorphan-guaiFENesin (MUCINEX DM) 30-600 MG 12hr tablet Take 1 tablet by mouth 2 (two) times daily for 5 days. Patient not taking: Reported on 10/19/2018 10/15/18 10/20/18  Cleora FleetJohnson, Clanford L, MD  levofloxacin (LEVAQUIN) 750 MG tablet Take 1 tablet (750 mg total) by mouth daily for 5 days. Patient not taking: Reported on 10/19/2018 10/15/18 10/20/18  Cleora FleetJohnson, Clanford L, MD     Allergies Keflex [cephalexin]  No family history on file.  Social History Social History   Tobacco Use  . Smoking status: Never Smoker  . Smokeless tobacco: Never Used  Substance Use Topics  . Alcohol use: Not Currently  . Drug use: Not Currently    Review of Systems  Constitutional: No fever/chills Eyes: No visual changes. ENT: No sore throat. Cardiovascular:  chest pain. Respiratory: shortness of breath. Gastrointestinal: No abdominal pain.  No nausea, no vomiting.  No diarrhea.  No constipation. Genitourinary: Negative for dysuria. Musculoskeletal: Negative for back pain. Skin: Negative for rash. Neurological: Negative for headaches, focal weakness   ____________________________________________   PHYSICAL EXAM:  VITAL SIGNS: ED Triage Vitals  Enc Vitals Group     BP 10/19/18 1511 (!) 152/85     Pulse Rate 10/19/18 1511  73     Resp 10/19/18 1511 18     Temp 10/19/18 1511 98.7 F (37.1 C)     Temp src --      SpO2 10/19/18 1511 98 %     Weight 10/19/18 1509 275 lb (124.7 kg)     Height 10/19/18 1509 6' (1.829 m)     Head Circumference --      Peak Flow --      Pain Score 10/19/18 1508 7     Pain Loc --      Pain Edu? --      Excl. in GC? --     Constitutional: Alert and oriented. Well appearing and in no acute distress. Eyes: Conjunctivae are normal.  Head: Atraumatic. Nose: No congestion/rhinnorhea. Mouth/Throat: Mucous membranes are moist.  Oropharynx non-erythematous. Neck: No stridor Cardiovascular: Normal rate, regular rhythm. Grossly normal heart sounds.  Good peripheral circulation. Respiratory: Normal respiratory effort.  No retractions. Lungs CTAB. Gastrointestinal: Soft and nontender. No distention. No abdominal bruits. No CVA tenderness. Musculoskeletal: No lower extremity tenderness nor edema Neurologic:  Normal speech and language. No gross focal neurologic deficits are appreciated.  Skin:  Skin is warm, dry and intact. No rash  noted. Psychiatric: Mood and affect are normal. Speech and behavior are normal.  ____________________________________________   LABS (all labs ordered are listed, but only abnormal results are displayed)  Labs Reviewed  COMPREHENSIVE METABOLIC PANEL - Abnormal; Notable for the following components:      Result Value   Glucose, Bld 231 (*)    All other components within normal limits  URINE DRUG SCREEN, QUALITATIVE ([facility] ONLY) - Abnormal; Notable for the following components:   Benzodiazepine, Ur Scrn POSITIVE (*)    All other components within normal limits  CBC WITH DIFFERENTIAL/PLATELET - Abnormal; Notable for the following components:   Hemoglobin 12.8 (*)    HCT 38.2 (*)    All other components within normal limits  URINALYSIS, COMPLETE (UACMP) WITH MICROSCOPIC - Abnormal; Notable for the following components:   Color, Urine YELLOW (*)    APPearance CLEAR (*)    Glucose, UA >=500 (*)    Hgb urine dipstick MODERATE (*)    Ketones, ur 5 (*)    Protein, ur 30 (*)    All other components within normal limits  VALPROIC ACID LEVEL - Abnormal; Notable for the following components:   Valproic Acid Lvl 22 (*)    All other components within normal limits  ETHANOL  TROPONIN I  TROPONIN I  PROCALCITONIN   ____________________________________________  EKG EKG read and interpreted by mu shows nsr @ 69. Ext R axis, RBBB. Similar to EKG from 10/16/18 ____________________________________________  RADIOLOGY  ED MD interpretation:   Official radiology report(s): Dg Chest 2 View  Result Date: 10/19/2018 CLINICAL DATA:  Follow-up pneumonia EXAM: CHEST - 2 VIEW COMPARISON:  October 14, 2018 FINDINGS: Stable cardiomegaly. Mild opacity in the right base may represent subtle infiltrate versus vascular crowding, not seen previously. No other interval changes or acute abnormalities. IMPRESSION: Vascular crowding versus subtle infiltrate in the right base. Recommend short-term follow-up to  ensure resolution. Electronically Signed   By: Gerome Sam III M.D   On: 10/19/2018 15:59    ____________________________________________   PROCEDURES  Procedure(s) performed:   Procedures  Critical Care performed:  ____________________________________________   INITIAL IMPRESSION / ASSESSMENT AND PLAN / ED COURSE  Patient has not been homicidal or suicidal here.  Procalcitonin is negative white count is normal chest x-ray  does not show what appears to be an active pneumonia patient is not coughing or febrile.  We will discharge him back is we cannot find him in another group home just because he is worried that 1 of the techs does not like him.  Additionally his troponins have been negative his EKG is unchanged         ____________________________________________   FINAL CLINICAL IMPRESSION(S) / ED DIAGNOSES  Final diagnoses:  Anxiety  Paranoid schizophrenia [facility])     ED Discharge Orders    None       Note:  This document was prepared using Dragon voice recognition software and may include unintentional dictation errors.    Arnaldo Natal, MD 10/19/18 1900    Arnaldo Natal, MD 10/19/18 1901

## 2018-10-19 NOTE — ED Notes (Signed)

## 2018-10-19 NOTE — ED Notes (Signed)
BEHAVIORAL HEALTH ROUNDING Patient sleeping: No. Patient alert and oriented: yes Behavior appropriate: Yes.  ; If no, describe:  Nutrition and fluids offered: yes Toileting and hygiene offered: Yes  Sitter present: q15 minute observations and security  monitoring Law enforcement present: Yes  ODS  

## 2018-10-19 NOTE — ED Notes (Signed)
Social work - DietitianBailey called and stated she spoke with Darnelle CatalanMalinda MD and if pt does not become medical there is nothing she can do so he can return to group home

## 2018-10-19 NOTE — ED Notes (Signed)
Called [facility] at [phone] to advise that patient ready to DC. They said they would let Lawanda the owner know that he needs a ride.

## 2018-11-07 ENCOUNTER — Encounter (HOSPITAL_COMMUNITY): Payer: Self-pay | Admitting: *Deleted

## 2018-11-07 ENCOUNTER — Other Ambulatory Visit: Payer: Self-pay

## 2018-11-07 ENCOUNTER — Emergency Department (HOSPITAL_COMMUNITY)
Admission: EM | Admit: 2018-11-07 | Discharge: 2018-11-07 | Disposition: A | Discharge status: Home / Self Care | Payer: Medicare Other | Attending: Emergency Medicine | Admitting: Emergency Medicine

## 2018-11-07 ENCOUNTER — Emergency Department (HOSPITAL_COMMUNITY): Payer: Medicare Other

## 2018-11-07 DIAGNOSIS — Z79899 Other long term (current) drug therapy: Secondary | ICD-10-CM | POA: Diagnosis not present

## 2018-11-07 DIAGNOSIS — Z794 Long term (current) use of insulin: Secondary | ICD-10-CM | POA: Insufficient documentation

## 2018-11-07 DIAGNOSIS — F200 Paranoid schizophrenia: Secondary | ICD-10-CM | POA: Insufficient documentation

## 2018-11-07 DIAGNOSIS — M549 Dorsalgia, unspecified: Secondary | ICD-10-CM | POA: Diagnosis present

## 2018-11-07 DIAGNOSIS — I10 Essential (primary) hypertension: Secondary | ICD-10-CM | POA: Diagnosis not present

## 2018-11-07 DIAGNOSIS — M5440 Lumbago with sciatica, unspecified side: Secondary | ICD-10-CM

## 2018-11-07 DIAGNOSIS — M5441 Lumbago with sciatica, right side: Secondary | ICD-10-CM | POA: Insufficient documentation

## 2018-11-07 DIAGNOSIS — E119 Type 2 diabetes mellitus without complications: Secondary | ICD-10-CM | POA: Diagnosis not present

## 2018-11-07 LAB — I-STAT CHEM 8, ED
BUN: 26 mg/dL — ABNORMAL HIGH (ref 8–23)
Calcium, Ion: 1.11 mmol/L — ABNORMAL LOW (ref 1.15–1.40)
Chloride: 91 mmol/L — ABNORMAL LOW (ref 98–111)
Creatinine, Ser: 1.2 mg/dL (ref 0.61–1.24)
Glucose, Bld: 117 mg/dL — ABNORMAL HIGH (ref 70–99)
HCT: 43 % (ref 39.0–52.0)
Hemoglobin: 14.6 g/dL (ref 13.0–17.0)
POTASSIUM: 3 mmol/L — AB (ref 3.5–5.1)
Sodium: 134 mmol/L — ABNORMAL LOW (ref 135–145)
TCO2: 26 mmol/L (ref 22–32)

## 2018-11-07 MED ORDER — MECLIZINE HCL 25 MG PO TABS: 25.0000 mg | ORAL_TABLET | Freq: Two times a day (BID) | ORAL | 0 refills | Status: DC | PRN

## 2018-11-07 MED ORDER — POTASSIUM CHLORIDE CRYS ER 20 MEQ PO TBCR
40.0000 meq | EXTENDED_RELEASE_TABLET | Freq: Once | ORAL | Status: AC
  Administered 2018-11-07: 40 meq via ORAL
  Filled 2018-11-07: qty 2

## 2018-11-07 MED ORDER — MECLIZINE HCL 12.5 MG PO TABS
25.0000 mg | ORAL_TABLET | Freq: Once | ORAL | Status: AC
  Administered 2018-11-07: 25 mg via ORAL
  Filled 2018-11-07: qty 2

## 2018-11-07 NOTE — ED Notes (Signed)
Group home is on the way to pick up patient.

## 2018-11-07 NOTE — ED Notes (Signed)
I spoke to Jason [name] at [facility] to advise the patient was being discharged. She informed me she would have to speak to her superviser about picking up the patient.

## 2018-11-07 NOTE — ED Notes (Signed)
Jason [name] ( [facility]) contacted to find out if someone was coming to pick up the patient. She has text her supervisor and is waiting on an answer. Informed her that a phone call may be more appropriate at this time.

## 2018-11-07 NOTE — Discharge Instructions (Addendum)
Follow up with your md next week. °

## 2018-11-07 NOTE — ED Notes (Signed)
RN spoke with caregiver at [facility] who advised that pt is very manipulative and that when he gets mad at staff he will call ems, staff denies any falls, states that pt will lay down in the floor when he is mad.  Pt does have Guardian, first name of :Chavis, staff unsure of last name Contact number is [phone]

## 2018-11-07 NOTE — ED Provider Notes (Signed)
[facility] EMERGENCY DEPARTMENT Provider Note   CSN: [id number] Arrival date & time: 11/07/18  1858     History   Chief Complaint Chief Complaint  Patient presents with  . Back Pain    HPI Jason [name] is a 67 y.o. male.  Patient states he has been dizzy and fell and has pain in his back  The history is provided by the patient. No language interpreter was used.  Back Pain   This is a new problem. The current episode started more than 2 days ago. The problem occurs constantly. The problem has not changed since onset.Associated with: Fall. The pain is present in the lumbar spine. The quality of the pain is described as stabbing. The pain does not radiate. The pain is at a severity of 2/10. The pain is moderate. Pertinent negatives include no chest pain, no headaches and no abdominal pain.    Past Medical History:  Diagnosis Date  . Diabetes mellitus without complication (HCC)   . GERD (gastroesophageal reflux disease)   . Hypertension   . Paranoid schizophrenia (HCC)   . Stroke (HCC)   . Tourette disorder     Patient Active Problem List   Diagnosis Date Noted  . Community acquired pneumonia 10/14/2018  . Dizziness 08/25/2018  . Hypokalemia due to inadequate potassium intake 08/16/2018  . Hypertension 08/07/2018  . Malingering 08/07/2018  . Intellectual disability 08/07/2018  . Paranoid schizophrenia (HCC) 07/23/2018  . Diabetes mellitus without complication (HCC) 07/23/2018  . Tourette disorder 07/23/2018    Past Surgical History:  Procedure Laterality Date  . CARDIAC SURGERY    . CORONARY ARTERY BYPASS GRAFT    . HAND SURGERY    . TONSILLECTOMY          Home Medications    Prior to Admission medications   Medication Sig Start Date End Date Taking? Authorizing Provider  acetaminophen (TYLENOL) 500 MG tablet Take 500 mg by mouth every 4 (four) hours as needed for mild pain or fever.     [provider]  alum & mag hydroxide-simeth (MINTOX  REGULAR STRENGTH) 200-200-20 MG/5ML suspension Take 15-30 mLs by mouth as needed for indigestion or heartburn.    [provider]  amLODipine (NORVASC) 10 MG tablet Take 10 mg by mouth daily.    [provider]  aspirin EC 325 MG tablet Take 325 mg by mouth daily.    [provider]  atorvastatin (LIPITOR) 80 MG tablet Take 80 mg by mouth at bedtime.     [provider]  benztropine (COGENTIN) 0.5 MG tablet Take 0.5 mg by mouth 2 (two) times daily.    [provider]  carvedilol (COREG) 6.25 MG tablet Take 6.25 mg by mouth 2 (two) times daily.     [provider]  clopidogrel (PLAVIX) 75 MG tablet Take 75 mg by mouth daily.    [provider]  divalproex (DEPAKOTE ER) 500 MG 24 hr tablet Take 1,000 mg by mouth daily before supper. At 1600    [provider]  ezetimibe (ZETIA) 10 MG tablet Take 10 mg by mouth daily. 08/24/18   [provider]  furosemide (LASIX) 40 MG tablet Take 1 tablet (40 mg total) by mouth 2 (two) times daily. 08/17/18   Enid Baas, MD  gabapentin (NEURONTIN) 300 MG capsule Take 300 mg by mouth 3 (three) times daily.    [provider]  glipiZIDE (GLUCOTROL) 5 MG tablet Take 5 mg by mouth daily.  [provider]  insulin degludec (TRESIBA) 100 UNIT/ML SOPN FlexTouch Pen Inject 30 Units into the skin at bedtime.    [provider]  insulin lispro (HUMALOG) 100 UNIT/ML injection Inject 57 Units into the skin 3 (three) times daily before meals.    [provider]  isosorbide mononitrate (IMDUR) 60 MG 24 hr tablet Take 60 mg by mouth daily.    [provider]  loperamide (IMODIUM) 2 MG capsule Take 2 mg by mouth daily as needed for diarrhea or loose stools.     [provider]  LORazepam (ATIVAN) 0.5 MG tablet Take 0.5 mg by mouth 3 (three) times daily.     [provider]  magnesium hydroxide (MILK OF MAGNESIA) 400 MG/5ML  suspension Take 30 mLs by mouth at bedtime as needed for mild constipation.     [provider]  meclizine (ANTIVERT) 25 MG tablet Take 1 tablet (25 mg total) by mouth 2 (two) times daily as needed for dizziness. 11/07/18   Bethann BerkshireZammit, Abrian Hanover, MD  metFORMIN (GLUCOPHAGE) 1000 MG tablet Take 1,000 mg by mouth 2 (two) times daily.     [provider]  metolazone (ZAROXOLYN) 2.5 MG tablet Take 2.5 mg by mouth daily.    [provider]  neomycin-bacitracin-polymyxin (NEOSPORIN) [phone] ointment Apply 1 application topically as needed (for minor cuts and abrasions).     [provider]  nitroGLYCERIN (NITROSTAT) 0.4 MG SL tablet Place 0.4 mg under the tongue every 5 (five) minutes as needed for chest pain.    [provider]  Pimozide 1 MG TABS Take 1 mg by mouth 2 (two) times daily.    [provider]  potassium chloride 20 MEQ TBCR Take 20 mEq by mouth 2 (two) times daily. Patient taking differently: Take 40 mEq by mouth 2 (two) times daily.  08/17/18   Enid BaasKalisetti, Radhika, MD  pseudoephedrine-dextromethorphan-guaifenesin (ROBITUSSIN-PE) 30-10-100 MG/5ML solution Take 10 mLs by mouth every 6 (six) hours as needed for cough.    [provider]  risperiDONE (RISPERDAL) 1 MG tablet Take 1.5 mg by mouth 2 (two) times daily.     [provider]  tamsulosin (FLOMAX) 0.4 MG CAPS capsule Take 0.4 mg by mouth daily.     [provider]  Vitamin D, Ergocalciferol, (DRISDOL) 50000 units CAPS capsule Take 50,000 Units by mouth every Friday.     [provider]    Family History No family history on file.  Social History Social History   Tobacco Use  . Smoking status: Never Smoker  . Smokeless tobacco: Never Used  Substance Use Topics  . Alcohol use: Not Currently  . Drug use: Not Currently     Allergies   Keflex [cephalexin]   Review of Systems Review of Systems  Constitutional: Negative for appetite change and  fatigue.  HENT: Negative for congestion, ear discharge and sinus pressure.   Eyes: Negative for discharge.  Respiratory: Negative for cough.   Cardiovascular: Negative for chest pain.  Gastrointestinal: Negative for abdominal pain and diarrhea.  Genitourinary: Negative for frequency and hematuria.  Musculoskeletal: Positive for back pain.  Skin: Negative for rash.  Neurological: Negative for seizures and headaches.  Psychiatric/Behavioral: Negative for hallucinations.     Physical Exam Updated Vital Signs BP (!) 150/90   Pulse 81   Temp 98.2 F (36.8 C) (Oral)   Resp 18   Ht 6' (1.829 m)   Wt 131.5 kg   SpO2 98%   BMI 39.33 kg/m  Physical Exam Constitutional:      Appearance: He is well-developed.  HENT:     Head: Normocephalic.  Eyes:     General: No scleral icterus.    Conjunctiva/sclera: Conjunctivae normal.  Neck:     Musculoskeletal: Neck supple.     Thyroid: No thyromegaly.  Cardiovascular:     Rate and Rhythm: Normal rate and regular rhythm.     Heart sounds: No murmur. No friction rub. No gallop.   Pulmonary:     Breath sounds: No stridor. No wheezing or rales.  Chest:     Chest wall: No tenderness.  Abdominal:     General: There is no distension.     Tenderness: There is no abdominal tenderness. There is no rebound.  Musculoskeletal: Normal range of motion.  Lymphadenopathy:     Cervical: No cervical adenopathy.  Skin:    Findings: No erythema or rash.  Neurological:     Mental Status: He is alert and oriented to person, place, and time.     Motor: No abnormal muscle tone.     Coordination: Coordination normal.  Psychiatric:        Behavior: Behavior normal.      ED Treatments / Results  Labs (all labs ordered are listed, but only abnormal results are displayed) Labs Reviewed  I-STAT CHEM 8, ED - Abnormal; Notable for the following components:      Result Value   Sodium 134 (*)    Potassium 3.0 (*)    Chloride 91 (*)    BUN 26 (*)     Glucose, Bld 117 (*)    Calcium, Ion 1.11 (*)    All other components within normal limits    EKG None  Radiology Dg Lumbar Spine Complete  Result Date: 11/07/2018 CLINICAL DATA:  Low back pain EXAM: LUMBAR SPINE - COMPLETE 4+ VIEW COMPARISON:  None. FINDINGS: Trace retrolisthesis L4 on L5. Vertebral body heights appear normal. Mild degenerative changes at T12-L1 and L1-L2. Small anterior osteophyte at L3-L4. IMPRESSION: Mild degenerative changes.  No acute osseous abnormality. Electronically Signed   By: Jasmine PangKim  Fujinaga M.D.   On: 11/07/2018 20:10    Procedures Procedures (including critical care time)  Medications Ordered in ED Medications  meclizine (ANTIVERT) tablet 25 mg (has no administration in time range)  potassium chloride SA (K-DUR,KLOR-CON) CR tablet 40 mEq (40 mEq Oral Given 11/07/18 2022)     Initial Impression / Assessment and Plan / ED Course  I have reviewed the triage vital signs and the nursing notes.  Pertinent labs & imaging results that were available during my care of the patient were reviewed by me and considered in my medical decision making (see chart for details).     Patient with some dizziness.  He will be given some Antivert.  He has contusion to lumbar spine and hypokalemia.  Patient will be discharged home  Final Clinical Impressions(s) / ED Diagnoses   Final diagnoses:  Acute right-sided low back pain with sciatica, sciatica laterality unspecified    ED Discharge Orders         Ordered    meclizine (ANTIVERT) 25 MG tablet  2 times daily PRN     11/07/18 2025           Bethann BerkshireZammit, Even Budlong, MD 11/07/18 2032

## 2018-11-07 NOTE — ED Notes (Signed)
Patient was provided the call bell by xray personal. Patient removed the call bell from the wall and began using the end to scrape his arm. Call bell removed from hand. This is second attempt by patient to harm arm.

## 2018-11-07 NOTE — ED Triage Notes (Signed)
Pt arrived to er from [facility] for further evaluation of back pain and dizziness that started three days ago, pt also reports that he has had two falls due to the dizziness, pt arrived to er stating " call the law, I am not going back to the group home"

## 2018-11-08 ENCOUNTER — Encounter (HOSPITAL_COMMUNITY): Payer: Self-pay | Admitting: Emergency Medicine

## 2018-11-08 ENCOUNTER — Emergency Department (HOSPITAL_COMMUNITY)
Admission: EM | Admit: 2018-11-08 | Discharge: 2018-11-09 | Disposition: A | Discharge status: Home / Self Care | Payer: Medicare Other | Attending: Emergency Medicine | Admitting: Emergency Medicine

## 2018-11-08 ENCOUNTER — Emergency Department (HOSPITAL_COMMUNITY): Payer: Medicare Other

## 2018-11-08 ENCOUNTER — Other Ambulatory Visit: Payer: Self-pay

## 2018-11-08 DIAGNOSIS — Y998 Other external cause status: Secondary | ICD-10-CM | POA: Insufficient documentation

## 2018-11-08 DIAGNOSIS — Z951 Presence of aortocoronary bypass graft: Secondary | ICD-10-CM | POA: Diagnosis not present

## 2018-11-08 DIAGNOSIS — E876 Hypokalemia: Secondary | ICD-10-CM | POA: Diagnosis not present

## 2018-11-08 DIAGNOSIS — R45851 Suicidal ideations: Secondary | ICD-10-CM | POA: Diagnosis not present

## 2018-11-08 DIAGNOSIS — I10 Essential (primary) hypertension: Secondary | ICD-10-CM | POA: Diagnosis not present

## 2018-11-08 DIAGNOSIS — Z794 Long term (current) use of insulin: Secondary | ICD-10-CM | POA: Insufficient documentation

## 2018-11-08 DIAGNOSIS — F79 Unspecified intellectual disabilities: Secondary | ICD-10-CM | POA: Diagnosis not present

## 2018-11-08 DIAGNOSIS — Y92199 Unspecified place in other specified residential institution as the place of occurrence of the external cause: Secondary | ICD-10-CM | POA: Diagnosis not present

## 2018-11-08 DIAGNOSIS — Z7902 Long term (current) use of antithrombotics/antiplatelets: Secondary | ICD-10-CM | POA: Insufficient documentation

## 2018-11-08 DIAGNOSIS — Z79899 Other long term (current) drug therapy: Secondary | ICD-10-CM | POA: Insufficient documentation

## 2018-11-08 DIAGNOSIS — E119 Type 2 diabetes mellitus without complications: Secondary | ICD-10-CM | POA: Diagnosis not present

## 2018-11-08 DIAGNOSIS — S50812A Abrasion of left forearm, initial encounter: Secondary | ICD-10-CM | POA: Diagnosis not present

## 2018-11-08 DIAGNOSIS — Z8673 Personal history of transient ischemic attack (TIA), and cerebral infarction without residual deficits: Secondary | ICD-10-CM | POA: Insufficient documentation

## 2018-11-08 DIAGNOSIS — F200 Paranoid schizophrenia: Secondary | ICD-10-CM | POA: Insufficient documentation

## 2018-11-08 DIAGNOSIS — S41102A Unspecified open wound of left upper arm, initial encounter: Secondary | ICD-10-CM

## 2018-11-08 DIAGNOSIS — Z7982 Long term (current) use of aspirin: Secondary | ICD-10-CM | POA: Diagnosis not present

## 2018-11-08 DIAGNOSIS — Y9389 Activity, other specified: Secondary | ICD-10-CM | POA: Diagnosis not present

## 2018-11-08 DIAGNOSIS — S59912A Unspecified injury of left forearm, initial encounter: Secondary | ICD-10-CM | POA: Diagnosis present

## 2018-11-08 LAB — RAPID URINE DRUG SCREEN, HOSP PERFORMED
AMPHETAMINES: NOT DETECTED
Barbiturates: NOT DETECTED
Benzodiazepines: NOT DETECTED
Cocaine: NOT DETECTED
Opiates: NOT DETECTED
Tetrahydrocannabinol: NOT DETECTED

## 2018-11-08 LAB — COMPREHENSIVE METABOLIC PANEL
ALT: 12 U/L (ref 0–44)
AST: 15 U/L (ref 15–41)
Albumin: 3.6 g/dL (ref 3.5–5.0)
Alkaline Phosphatase: 59 U/L (ref 38–126)
Anion gap: 11 (ref 5–15)
BUN: 25 mg/dL — ABNORMAL HIGH (ref 8–23)
CO2: 28 mmol/L (ref 22–32)
CREATININE: 1.14 mg/dL (ref 0.61–1.24)
Calcium: 9.1 mg/dL (ref 8.9–10.3)
Chloride: 97 mmol/L — ABNORMAL LOW (ref 98–111)
GFR calc Af Amer: >60 mL/min (ref >60)
GFR calc non Af Amer: >60 mL/min (ref >60)
Glucose, Bld: 104 mg/dL — ABNORMAL HIGH (ref 70–99)
Potassium: 2.7 mmol/L — CL (ref 3.5–5.1)
Sodium: 136 mmol/L (ref 135–145)
Total Bilirubin: 0.6 mg/dL (ref 0.3–1.2)
Total Protein: 6.9 g/dL (ref 6.5–8.1)

## 2018-11-08 LAB — CBC
HCT: 41.6 % (ref 39.0–52.0)
Hemoglobin: 13.9 g/dL (ref 13.0–17.0)
MCH: 28 pg (ref 26.0–34.0)
MCHC: 33.4 g/dL (ref 30.0–36.0)
MCV: 83.9 fL (ref 80.0–100.0)
Platelets: 189 10*3/uL (ref 150–400)
RBC: 4.96 MIL/uL (ref 4.22–5.81)
RDW: 14.6 % (ref 11.5–15.5)
WBC: 11.5 10*3/uL — ABNORMAL HIGH (ref 4.0–10.5)
nRBC: 0 % (ref 0.0–0.2)

## 2018-11-08 LAB — SALICYLATE LEVEL: Salicylate Lvl: <7 mg/dL (ref 2.8–30.0)

## 2018-11-08 LAB — CBG MONITORING, ED: Glucose-Capillary: 67 mg/dL — ABNORMAL LOW (ref 70–99)

## 2018-11-08 LAB — ACETAMINOPHEN LEVEL: Acetaminophen (Tylenol), Serum: <10 ug/mL — ABNORMAL LOW (ref 10–30)

## 2018-11-08 LAB — ETHANOL: Alcohol, Ethyl (B): <10 mg/dL (ref <10)

## 2018-11-08 MED ORDER — EZETIMIBE 10 MG PO TABS
10.0000 mg | ORAL_TABLET | Freq: Every day | ORAL | Status: DC
  Administered 2018-11-09: 10 mg via ORAL
  Filled 2018-11-08 (×3): qty 1

## 2018-11-08 MED ORDER — ATORVASTATIN CALCIUM 40 MG PO TABS
80.0000 mg | ORAL_TABLET | Freq: Every day | ORAL | Status: DC
  Administered 2018-11-09: 80 mg via ORAL
  Filled 2018-11-08: qty 2

## 2018-11-08 MED ORDER — INSULIN ASPART 100 UNIT/ML ~~LOC~~ SOLN
57.0000 [IU] | Freq: Three times a day (TID) | SUBCUTANEOUS | Status: DC
  Administered 2018-11-08 – 2018-11-09 (×3): 57 [IU] via SUBCUTANEOUS
  Filled 2018-11-08 (×3): qty 1

## 2018-11-08 MED ORDER — BENZTROPINE MESYLATE 1 MG PO TABS
0.5000 mg | ORAL_TABLET | Freq: Two times a day (BID) | ORAL | Status: DC
  Administered 2018-11-09 (×2): 0.5 mg via ORAL
  Filled 2018-11-08 (×2): qty 1

## 2018-11-08 MED ORDER — GABAPENTIN 300 MG PO CAPS
300.0000 mg | ORAL_CAPSULE | Freq: Three times a day (TID) | ORAL | Status: DC
  Administered 2018-11-08 – 2018-11-09 (×2): 300 mg via ORAL
  Filled 2018-11-08 (×2): qty 1

## 2018-11-08 MED ORDER — RISPERIDONE 1 MG PO TBDP: 2.0000 mg | ORAL_TABLET | Freq: Three times a day (TID) | ORAL | Status: DC | PRN

## 2018-11-08 MED ORDER — POTASSIUM CHLORIDE CRYS ER 20 MEQ PO TBCR
40.0000 meq | EXTENDED_RELEASE_TABLET | Freq: Once | ORAL | Status: AC
  Administered 2018-11-08: 40 meq via ORAL
  Filled 2018-11-08: qty 2

## 2018-11-08 MED ORDER — MECLIZINE HCL 12.5 MG PO TABS: 25.0000 mg | ORAL_TABLET | Freq: Two times a day (BID) | ORAL | Status: DC | PRN

## 2018-11-08 MED ORDER — DIVALPROEX SODIUM ER 500 MG PO TB24
1000.0000 mg | ORAL_TABLET | Freq: Every day | ORAL | Status: DC
  Administered 2018-11-08: 1000 mg via ORAL
  Filled 2018-11-08: qty 2

## 2018-11-08 MED ORDER — BACITRACIN-NEOMYCIN-POLYMYXIN 400-5-5000 EX OINT
TOPICAL_OINTMENT | Freq: Two times a day (BID) | CUTANEOUS | Status: DC
  Administered 2018-11-09: 1 via TOPICAL
  Filled 2018-11-08: qty 1

## 2018-11-08 MED ORDER — TETANUS-DIPHTH-ACELL PERTUSSIS 5-2.5-18.5 LF-MCG/0.5 IM SUSP
0.5000 mL | Freq: Once | INTRAMUSCULAR | Status: AC
  Administered 2018-11-08: 0.5 mL via INTRAMUSCULAR

## 2018-11-08 MED ORDER — CLOPIDOGREL BISULFATE 75 MG PO TABS
75.0000 mg | ORAL_TABLET | Freq: Every day | ORAL | Status: DC
  Administered 2018-11-08 – 2018-11-09 (×2): 75 mg via ORAL
  Filled 2018-11-08 (×2): qty 1

## 2018-11-08 MED ORDER — TAMSULOSIN HCL 0.4 MG PO CAPS
0.4000 mg | ORAL_CAPSULE | Freq: Every day | ORAL | Status: DC
  Administered 2018-11-08 – 2018-11-09 (×2): 0.4 mg via ORAL
  Filled 2018-11-08 (×2): qty 1

## 2018-11-08 MED ORDER — METFORMIN HCL 500 MG PO TABS
1000.0000 mg | ORAL_TABLET | Freq: Two times a day (BID) | ORAL | Status: DC
  Administered 2018-11-09 (×2): 1000 mg via ORAL
  Filled 2018-11-08 (×2): qty 2

## 2018-11-08 MED ORDER — LORAZEPAM 1 MG PO TABS
1.0000 mg | ORAL_TABLET | ORAL | Status: AC | PRN
  Administered 2018-11-09: 1 mg via ORAL
  Filled 2018-11-08: qty 1

## 2018-11-08 MED ORDER — CARVEDILOL 12.5 MG PO TABS
6.2500 mg | ORAL_TABLET | Freq: Two times a day (BID) | ORAL | Status: DC
  Administered 2018-11-09: 6.25 mg via ORAL
  Filled 2018-11-08 (×2): qty 1

## 2018-11-08 MED ORDER — AMLODIPINE BESYLATE 5 MG PO TABS
10.0000 mg | ORAL_TABLET | Freq: Every day | ORAL | Status: DC
  Administered 2018-11-08 – 2018-11-09 (×2): 10 mg via ORAL
  Filled 2018-11-08 (×2): qty 2

## 2018-11-08 MED ORDER — ZIPRASIDONE MESYLATE 20 MG IM SOLR
20.0000 mg | INTRAMUSCULAR | Status: AC | PRN
  Administered 2018-11-09: 20 mg via INTRAMUSCULAR
  Filled 2018-11-08: qty 20

## 2018-11-08 MED ORDER — ACETAMINOPHEN 325 MG PO TABS: 650.0000 mg | ORAL_TABLET | ORAL | Status: DC | PRN

## 2018-11-08 MED ORDER — ASPIRIN EC 325 MG PO TBEC
325.0000 mg | DELAYED_RELEASE_TABLET | Freq: Every day | ORAL | Status: DC
  Administered 2018-11-08 – 2018-11-09 (×2): 325 mg via ORAL
  Filled 2018-11-08 (×2): qty 1

## 2018-11-08 MED ORDER — RISPERIDONE 1 MG PO TABS
1.5000 mg | ORAL_TABLET | Freq: Two times a day (BID) | ORAL | Status: DC
  Administered 2018-11-09 (×2): 1.5 mg via ORAL
  Filled 2018-11-08 (×2): qty 2

## 2018-11-08 MED ORDER — FUROSEMIDE 40 MG PO TABS
40.0000 mg | ORAL_TABLET | Freq: Two times a day (BID) | ORAL | Status: DC
  Administered 2018-11-08 – 2018-11-09 (×2): 40 mg via ORAL
  Filled 2018-11-08 (×2): qty 1

## 2018-11-08 MED ORDER — MAGNESIUM OXIDE 400 (241.3 MG) MG PO TABS
800.0000 mg | ORAL_TABLET | Freq: Once | ORAL | Status: AC
  Administered 2018-11-08: 800 mg via ORAL
  Filled 2018-11-08: qty 2

## 2018-11-08 MED ORDER — GLIPIZIDE 5 MG PO TABS: 5.0000 mg | ORAL_TABLET | Freq: Every day | ORAL | Status: DC

## 2018-11-08 MED ORDER — ISOSORBIDE MONONITRATE ER 60 MG PO TB24
60.0000 mg | ORAL_TABLET | Freq: Every day | ORAL | Status: DC
  Administered 2018-11-09: 60 mg via ORAL
  Filled 2018-11-08 (×3): qty 1

## 2018-11-08 MED ORDER — ZOLPIDEM TARTRATE 5 MG PO TABS: 5.0000 mg | ORAL_TABLET | Freq: Every evening | ORAL | Status: DC | PRN

## 2018-11-08 NOTE — ED Notes (Signed)
Pt reports he is not getting up  He refuses his night meds adamantly  He reports he has to urinate and has to get up   When asked since he is up does he want his meds, he replies no

## 2018-11-08 NOTE — ED Triage Notes (Signed)
At [facility] month Altercation today with staff and razor Foot pain L from stepping on glass Arm pain from electric razor  Report from EMS unhappy where he lives
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
## 2018-11-08 NOTE — ED Notes (Signed)
Elon JesterLaWanda- Owner   [facility]  [phone]   Pt is adamant that he does not wish to return to that setting .

## 2018-11-08 NOTE — ED Notes (Signed)
Wounds cleaned and wrapped.  
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
## 2018-11-08 NOTE — ED Notes (Signed)
[facility]   Need [facility]  UA to connect

## 2018-11-08 NOTE — ED Notes (Signed)
Spoke with Rowan BlaseLawanda who is owner of [facility].  States she is not unfamiliar with the patient's behavior.  They will have to take the pt back tomorrow if required.  Her number is [phone].

## 2018-11-08 NOTE — ED Notes (Signed)
TTS in process 

## 2018-11-09 DIAGNOSIS — S50812A Abrasion of left forearm, initial encounter: Secondary | ICD-10-CM | POA: Diagnosis not present

## 2018-11-09 LAB — CBG MONITORING, ED
GLUCOSE-CAPILLARY: 158 mg/dL — AB (ref 70–99)
Glucose-Capillary: 155 mg/dL — ABNORMAL HIGH (ref 70–99)

## 2018-11-09 LAB — BASIC METABOLIC PANEL
Anion gap: 13 (ref 5–15)
BUN: 21 mg/dL (ref 8–23)
CHLORIDE: 94 mmol/L — AB (ref 98–111)
CO2: 29 mmol/L (ref 22–32)
CREATININE: 1.08 mg/dL (ref 0.61–1.24)
Calcium: 9.3 mg/dL (ref 8.9–10.3)
GFR calc Af Amer: >60 mL/min (ref >60)
GFR calc non Af Amer: >60 mL/min (ref >60)
Glucose, Bld: 156 mg/dL — ABNORMAL HIGH (ref 70–99)
Potassium: 2.5 mmol/L — CL (ref 3.5–5.1)
Sodium: 136 mmol/L (ref 135–145)

## 2018-11-09 MED ORDER — STERILE WATER FOR INJECTION IJ SOLN
INTRAMUSCULAR | Status: AC
  Filled 2018-11-09: qty 10

## 2018-11-09 MED ORDER — POTASSIUM CHLORIDE CRYS ER 20 MEQ PO TBCR
40.0000 meq | EXTENDED_RELEASE_TABLET | Freq: Once | ORAL | Status: AC
  Administered 2018-11-09: 40 meq via ORAL
  Filled 2018-11-09: qty 2

## 2018-11-09 MED ORDER — POTASSIUM CHLORIDE CRYS ER 20 MEQ PO TBCR: 20.0000 meq | EXTENDED_RELEASE_TABLET | Freq: Two times a day (BID) | ORAL | Status: DC

## 2018-11-09 MED ORDER — GLIPIZIDE 5 MG PO TABS
5.0000 mg | ORAL_TABLET | Freq: Every day | ORAL | Status: DC
  Administered 2018-11-09: 5 mg via ORAL
  Filled 2018-11-09 (×3): qty 1

## 2018-11-09 NOTE — ED Notes (Signed)
Pt screaming out and hollering stating "I dont want to go back." Tried to console pt with no success. Pt redirected multiple times and pt continues to scream out.
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
## 2018-11-09 NOTE — ED Notes (Signed)
Pt standing to use urinal

## 2018-11-09 NOTE — ED Notes (Signed)
Pt consumed 75% of breakfast tray

## 2018-11-09 NOTE — Progress Notes (Signed)
Disposition CSW called [name] [facility] Manager, Rowan BlaseLawanda, who agrees to pick patient up but is currently in [place] with another client.  She agreed to contact someone on her staff to pick patient up.  She will call the [facility] ED directly. CSW contacted patient's nurse, christy, RN, and advised of status.  Timmothy EulerJean T. Kaylyn LimSutter, MSW, LCSWA Disposition Clinical Social Work [phone] (cell) [phone] (office)

## 2018-11-09 NOTE — ED Notes (Signed)
[facility]. [facility]

## 2018-11-09 NOTE — Discharge Instructions (Addendum)
Follow up as instructed by behavior health 

## 2018-11-09 NOTE — ED Notes (Signed)
Pt given graham crackers and diet ginger ale to drink.

## 2018-11-09 NOTE — ED Notes (Addendum)
Given breakfast tray. 

## 2018-11-09 NOTE — ED Notes (Signed)
CRITICAL VALUE ALERT  Critical Value:  k 2.5  Date & Time Notied:  1235 11/09/18  Provider Notified: Dr Clarene DukeMcmanus  Orders Received/Actions taken:
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
## 2018-11-09 NOTE — ED Notes (Signed)
Pt is moaning out repeatedly  "I don't want to go back or Im gone to cut myself."

## 2018-11-09 NOTE — ED Notes (Signed)
Pt left with son

## 2019-01-13 ENCOUNTER — Encounter (HOSPITAL_COMMUNITY): Payer: Self-pay

## 2019-01-13 ENCOUNTER — Emergency Department (HOSPITAL_COMMUNITY)
Admission: EM | Admit: 2019-01-13 | Discharge: 2019-01-18 | Disposition: A | Discharge status: Home / Self Care | Payer: Medicare Other | Attending: Emergency Medicine | Admitting: Emergency Medicine

## 2019-01-13 ENCOUNTER — Other Ambulatory Visit: Payer: Self-pay

## 2019-01-13 DIAGNOSIS — Z7982 Long term (current) use of aspirin: Secondary | ICD-10-CM | POA: Insufficient documentation

## 2019-01-13 DIAGNOSIS — Z79899 Other long term (current) drug therapy: Secondary | ICD-10-CM | POA: Diagnosis not present

## 2019-01-13 DIAGNOSIS — Y999 Unspecified external cause status: Secondary | ICD-10-CM | POA: Diagnosis not present

## 2019-01-13 DIAGNOSIS — Y939 Activity, unspecified: Secondary | ICD-10-CM | POA: Diagnosis not present

## 2019-01-13 DIAGNOSIS — S71112A Laceration without foreign body, left thigh, initial encounter: Secondary | ICD-10-CM | POA: Insufficient documentation

## 2019-01-13 DIAGNOSIS — T148XXA Other injury of unspecified body region, initial encounter: Secondary | ICD-10-CM

## 2019-01-13 DIAGNOSIS — I10 Essential (primary) hypertension: Secondary | ICD-10-CM | POA: Diagnosis not present

## 2019-01-13 DIAGNOSIS — Y929 Unspecified place or not applicable: Secondary | ICD-10-CM | POA: Insufficient documentation

## 2019-01-13 DIAGNOSIS — F209 Schizophrenia, unspecified: Secondary | ICD-10-CM | POA: Diagnosis not present

## 2019-01-13 DIAGNOSIS — E876 Hypokalemia: Secondary | ICD-10-CM | POA: Diagnosis not present

## 2019-01-13 DIAGNOSIS — Z951 Presence of aortocoronary bypass graft: Secondary | ICD-10-CM | POA: Insufficient documentation

## 2019-01-13 DIAGNOSIS — X789XXA Intentional self-harm by unspecified sharp object, initial encounter: Secondary | ICD-10-CM | POA: Insufficient documentation

## 2019-01-13 DIAGNOSIS — Z7902 Long term (current) use of antithrombotics/antiplatelets: Secondary | ICD-10-CM | POA: Insufficient documentation

## 2019-01-13 DIAGNOSIS — E119 Type 2 diabetes mellitus without complications: Secondary | ICD-10-CM | POA: Diagnosis not present

## 2019-01-13 DIAGNOSIS — Z794 Long term (current) use of insulin: Secondary | ICD-10-CM | POA: Diagnosis not present

## 2019-01-13 DIAGNOSIS — R45851 Suicidal ideations: Secondary | ICD-10-CM | POA: Diagnosis not present

## 2019-01-13 DIAGNOSIS — S61512A Laceration without foreign body of left wrist, initial encounter: Secondary | ICD-10-CM | POA: Diagnosis not present

## 2019-01-13 LAB — COMPREHENSIVE METABOLIC PANEL
ALT: 11 U/L (ref 0–44)
AST: 18 U/L (ref 15–41)
Albumin: 4 g/dL (ref 3.5–5.0)
Alkaline Phosphatase: 62 U/L (ref 38–126)
Anion gap: 16 — ABNORMAL HIGH (ref 5–15)
BUN: 20 mg/dL (ref 8–23)
CO2: 27 mmol/L (ref 22–32)
Calcium: 9.9 mg/dL (ref 8.9–10.3)
Chloride: 97 mmol/L — ABNORMAL LOW (ref 98–111)
Creatinine, Ser: 1.34 mg/dL — ABNORMAL HIGH (ref 0.61–1.24)
GFR calc Af Amer: >60 mL/min (ref >60)
GFR calc non Af Amer: 55 mL/min — ABNORMAL LOW (ref >60)
Glucose, Bld: 149 mg/dL — ABNORMAL HIGH (ref 70–99)
Potassium: 2.8 mmol/L — ABNORMAL LOW (ref 3.5–5.1)
Sodium: 140 mmol/L (ref 135–145)
Total Bilirubin: 0.5 mg/dL (ref 0.3–1.2)
Total Protein: 7.6 g/dL (ref 6.5–8.1)

## 2019-01-13 LAB — ETHANOL: Alcohol, Ethyl (B): <10 mg/dL (ref <10)

## 2019-01-13 LAB — CBC
HCT: 40.2 % (ref 39.0–52.0)
Hemoglobin: 13.2 g/dL (ref 13.0–17.0)
MCH: 28.4 pg (ref 26.0–34.0)
MCHC: 32.8 g/dL (ref 30.0–36.0)
MCV: 86.6 fL (ref 80.0–100.0)
Platelets: 200 10*3/uL (ref 150–400)
RBC: 4.64 MIL/uL (ref 4.22–5.81)
RDW: 15 % (ref 11.5–15.5)
WBC: 9.6 10*3/uL (ref 4.0–10.5)
nRBC: 0 % (ref 0.0–0.2)

## 2019-01-13 LAB — SALICYLATE LEVEL: Salicylate Lvl: <7 mg/dL (ref 2.8–30.0)

## 2019-01-13 LAB — ACETAMINOPHEN LEVEL: Acetaminophen (Tylenol), Serum: <10 ug/mL — ABNORMAL LOW (ref 10–30)

## 2019-01-13 MED ORDER — LORAZEPAM 1 MG PO TABS
1.0000 mg | ORAL_TABLET | Freq: Once | ORAL | Status: AC
  Administered 2019-01-13: 1 mg via ORAL
  Filled 2019-01-13: qty 1

## 2019-01-13 MED ORDER — NICOTINE 21 MG/24HR TD PT24
21.0000 mg | MEDICATED_PATCH | Freq: Every day | TRANSDERMAL | Status: DC
  Filled 2019-01-13 (×3): qty 1

## 2019-01-13 MED ORDER — POTASSIUM CHLORIDE CRYS ER 20 MEQ PO TBCR
40.0000 meq | EXTENDED_RELEASE_TABLET | Freq: Once | ORAL | Status: AC
  Administered 2019-01-13: 40 meq via ORAL
  Filled 2019-01-13: qty 2

## 2019-01-13 MED ORDER — ONDANSETRON HCL 4 MG PO TABS: 4.0000 mg | ORAL_TABLET | Freq: Three times a day (TID) | ORAL | Status: DC | PRN

## 2019-01-13 MED ORDER — ALUM & MAG HYDROXIDE-SIMETH 200-200-20 MG/5ML PO SUSP: 30.0000 mL | Freq: Four times a day (QID) | ORAL | Status: DC | PRN

## 2019-01-13 MED ORDER — LORAZEPAM 1 MG PO TABS
1.0000 mg | ORAL_TABLET | Freq: Three times a day (TID) | ORAL | Status: DC
  Administered 2019-01-14 – 2019-01-18 (×12): 1 mg via ORAL
  Filled 2019-01-13 (×13): qty 1

## 2019-01-13 MED ORDER — ZOLPIDEM TARTRATE 5 MG PO TABS
5.0000 mg | ORAL_TABLET | Freq: Every evening | ORAL | Status: DC | PRN
  Administered 2019-01-14 – 2019-01-16 (×2): 5 mg via ORAL
  Filled 2019-01-13 (×3): qty 1

## 2019-01-13 MED ORDER — ACETAMINOPHEN 325 MG PO TABS
650.0000 mg | ORAL_TABLET | ORAL | Status: DC | PRN
  Administered 2019-01-17: 650 mg via ORAL
  Filled 2019-01-13: qty 2

## 2019-01-13 NOTE — Progress Notes (Signed)
Received a call from [place], [place], stating that they would accept pt 01/14/2019 after 0800. This information was provided to [facility] secretary Burnett Harry to provide to ED staff to ready pt for transport.  Room: [facility] Attending: Kevan Ny Call to Report: [phone]  Address: [facility] [address] [place], [place] [address]
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
## 2019-01-13 NOTE — ED Provider Notes (Addendum)
[facility] EMERGENCY DEPARTMENT Provider Note   CSN: [id number] Arrival date & time: 01/13/19  1810    History   Chief Complaint Chief Complaint  Patient presents with  . V70.1    HPI Jason [name] is a 68 y.o. male.     Level 5 caveat for schizophrenia.  Patient lives in a group home and does not want to return there.  He apparently has a superficial laceration on his left wrist and his left thigh.  He states he is suicidal.     Past Medical History:  Diagnosis Date  . Diabetes mellitus without complication (HCC)   . GERD (gastroesophageal reflux disease)   . Hypertension   . Paranoid schizophrenia (HCC)   . Stroke (HCC)   . Tourette disorder     Patient Active Problem List   Diagnosis Date Noted  . Community acquired pneumonia 10/14/2018  . Dizziness 08/25/2018  . Hypokalemia due to inadequate potassium intake 08/16/2018  . Hypertension 08/07/2018  . Malingering 08/07/2018  . Intellectual disability 08/07/2018  . Paranoid schizophrenia (HCC) 07/23/2018  . Diabetes mellitus without complication (HCC) 07/23/2018  . Tourette disorder 07/23/2018    Past Surgical History:  Procedure Laterality Date  . CARDIAC SURGERY    . CORONARY ARTERY BYPASS GRAFT    . HAND SURGERY    . TONSILLECTOMY          Home Medications    Prior to Admission medications   Medication Sig Start Date End Date Taking? Authorizing Provider  acetaminophen (TYLENOL) 500 MG tablet Take 500 mg by mouth every 4 (four) hours as needed for mild pain or fever.     [provider]  amLODipine (NORVASC) 10 MG tablet Take 10 mg by mouth daily.    [provider]  aspirin EC 325 MG tablet Take 325 mg by mouth daily.    [provider]  atorvastatin (LIPITOR) 80 MG tablet Take 80 mg by mouth at bedtime.     [provider]  benztropine (COGENTIN) 0.5 MG tablet Take 0.5 mg by mouth 2 (two) times daily.    [provider]  carvedilol (COREG) 6.25  MG tablet Take 6.25 mg by mouth 2 (two) times daily.     [provider]  clopidogrel (PLAVIX) 75 MG tablet Take 75 mg by mouth daily.    [provider]  divalproex (DEPAKOTE ER) 500 MG 24 hr tablet Take 1,000 mg by mouth daily before supper. At 1600    [provider]  ezetimibe (ZETIA) 10 MG tablet Take 10 mg by mouth daily. 08/24/18   [provider]  furosemide (LASIX) 40 MG tablet Take 1 tablet (40 mg total) by mouth 2 (two) times daily. 08/17/18   Enid Baas, MD  gabapentin (NEURONTIN) 300 MG capsule Take 300 mg by mouth 3 (three) times daily.    [provider]  glipiZIDE (GLUCOTROL) 5 MG tablet Take 5 mg by mouth daily.     [provider]  insulin degludec (TRESIBA) 100 UNIT/ML SOPN FlexTouch Pen Inject 30 Units into the skin at bedtime.    [provider]  insulin lispro (HUMALOG) 100 UNIT/ML injection Inject 57 Units into the skin 3 (three) times daily before meals.    [provider]  isosorbide mononitrate (IMDUR) 60 MG 24 hr tablet Take 60 mg by mouth daily.    [provider]  LORazepam (ATIVAN) 0.5 MG tablet Take 0.5 mg by mouth 3 (three) times daily.  [provider]  meclizine (ANTIVERT) 25 MG tablet Take 1 tablet (25 mg total) by mouth 2 (two) times daily as needed for dizziness. 11/07/18   Bethann Berkshire, MD  metFORMIN (GLUCOPHAGE) 1000 MG tablet Take 1,000 mg by mouth 2 (two) times daily.     [provider]  metolazone (ZAROXOLYN) 2.5 MG tablet Take 2.5 mg by mouth daily.    [provider]  nitroGLYCERIN (NITROSTAT) 0.4 MG SL tablet Place 0.4 mg under the tongue every 5 (five) minutes as needed for chest pain.    [provider]  Pimozide 1 MG TABS Take 1 mg by mouth 2 (two) times daily.    [provider]  potassium chloride 20 MEQ TBCR Take 20 mEq by mouth 2 (two) times daily. Patient taking differently: Take 40 mEq by mouth 2 (two) times  daily.  08/17/18   Enid Baas, MD  risperiDONE (RISPERDAL) 1 MG tablet Take 1.5 mg by mouth 2 (two) times daily.     [provider]  tamsulosin (FLOMAX) 0.4 MG CAPS capsule Take 0.4 mg by mouth daily.     [provider]  Vitamin D, Ergocalciferol, (DRISDOL) 50000 units CAPS capsule Take 50,000 Units by mouth every Friday.     [provider]    Family History No family history on file.  Social History Social History   Tobacco Use  . Smoking status: Never Smoker  . Smokeless tobacco: Never Used  Substance Use Topics  . Alcohol use: Not Currently  . Drug use: Not Currently     Allergies   Keflex [cephalexin]   Review of Systems Review of Systems  Unable to perform ROS: Psychiatric disorder     Physical Exam Updated Vital Signs BP 137/60 (BP Location: Right Arm)   Pulse 73   Temp 99.4 F (37.4 C) (Oral)   Resp 16   SpO2 93%   Physical Exam Vitals signs and nursing note reviewed.  Constitutional:      Appearance: He is well-developed.  HENT:     Head: Normocephalic and atraumatic.  Eyes:     Conjunctiva/sclera: Conjunctivae normal.  Neck:     Musculoskeletal: Neck supple.  Cardiovascular:     Rate and Rhythm: Normal rate and regular rhythm.  Pulmonary:     Effort: Pulmonary effort is normal.     Breath sounds: Normal breath sounds.  Abdominal:     General: Bowel sounds are normal.     Palpations: Abdomen is soft.  Musculoskeletal: Normal range of motion.  Skin:    Comments: Superficial abrasions on left anterior wrists and left proximal anterior thigh  Neurological:     Mental Status: He is alert and oriented to person, place, and time.  Psychiatric:     Comments: Flat affect, depressed      ED Treatments / Results  Labs (all labs ordered are listed, but only abnormal results are displayed) Labs Reviewed  COMPREHENSIVE METABOLIC PANEL - Abnormal; Notable for the following components:      Result Value    Potassium 2.8 (*)    Chloride 97 (*)    Glucose, Bld 149 (*)    Creatinine, Ser 1.34 (*)    GFR calc non Af Amer 55 (*)    Anion gap 16 (*)    All other components within normal limits  CBC  ETHANOL  SALICYLATE LEVEL  ACETAMINOPHEN LEVEL  RAPID URINE DRUG SCREEN, HOSP PERFORMED    EKG None  Radiology No results found.  Procedures Procedures (including critical care time)  Medications Ordered in ED Medications  potassium chloride SA (K-DUR,KLOR-CON) CR tablet 40 mEq (has no administration in time range)     Initial Impression / Assessment and Plan / ED Course  I have reviewed the triage vital signs and the nursing notes.  Pertinent labs & imaging results that were available during my care of the patient were reviewed by me and considered in my medical decision making (see chart for details).        Patient states suicidal ideation, specifically over his living arrangement.  Will obtain behavioral health consult.  Will replace potassium orally.  2100: Behavioral health will attempt to find an inpatient bed Final Clinical Impressions(s) / ED Diagnoses   Final diagnoses:  Suicidal ideation  Superficial laceration  Hypokalemia    ED Discharge Orders    None       Donnetta Hutching, MD 01/13/19 2106    Donnetta Hutching, MD 01/13/19 1610    Donnetta Hutching, MD 01/13/19 2202

## 2019-01-13 NOTE — Progress Notes (Signed)
Pt meets inpatient criteria per Donell Sievert, PA. Referral information has been sent to the following hospitals for review: [facility]  [facility]  [facility]  [facility]  [facility]  [facility]  [facility]  [facility]  [facility]  [facility]   Disposition will continue to assist with inpatient placement needs.   Wells Guiles, LCSW, LCAS Disposition CSW [facility]/TTS [phone]

## 2019-01-13 NOTE — ED Notes (Signed)
Patient resting at this time.

## 2019-01-13 NOTE — ED Notes (Signed)
Pt attempting to get out of bed. EDP and security at bedside.

## 2019-01-13 NOTE — ED Triage Notes (Addendum)
Pt brought in by EMS from [facility] due to making statements of SI. States he does not like living where he does. He has superficial cuts on his left arm and left upper thigh

## 2019-01-14 DIAGNOSIS — F209 Schizophrenia, unspecified: Secondary | ICD-10-CM | POA: Diagnosis not present

## 2019-01-14 LAB — RAPID URINE DRUG SCREEN, HOSP PERFORMED
Amphetamines: NOT DETECTED
Barbiturates: NOT DETECTED
Benzodiazepines: POSITIVE — AB
Cocaine: NOT DETECTED
Opiates: NOT DETECTED
Tetrahydrocannabinol: NOT DETECTED

## 2019-01-14 LAB — CBG MONITORING, ED: GLUCOSE-CAPILLARY: 121 mg/dL — AB (ref 70–99)

## 2019-01-14 NOTE — ED Notes (Signed)
Pt found to have blue plastic spoon. Pt broke spoon and began rubbing it against his already cut arm. edp notified

## 2019-01-14 NOTE — ED Notes (Signed)
Pt given meal tray.

## 2019-01-14 NOTE — ED Notes (Signed)
Patient threw his chair out of the room stating that is refuses to go back to [facility]. I informed patient that he was not going back to group home. Patient states he understands and calmed down.  Patient still visibly agitated.

## 2019-01-14 NOTE — BHH Counselor (Signed)
  [facility] reevaluated pt for safety and stability.  Pt admits SI/HI stating ""I want to hurt the med tech from where I come from.  I don't want to go back to where I come from."  Pt denies currently hearing voices states "I heard voices yesterday but I'm not hearing them this morning."  Pt was observed sitting on the side of his bed, appropriately dressed in scrubs.  Pt was alert and oriented x3 but had a hard time hearing.   Stefanie Hodgens L. Codylee Patil, MS, [facility], [facility] [address] Therapeutic Triage Specialist  [phone]

## 2019-01-14 NOTE — ED Notes (Signed)
This NT offered the pt a shower, pt refused.

## 2019-01-14 NOTE — ED Notes (Signed)
Patient threw water out of room this morning. Pt stated that he does not want to go back his group home.

## 2019-01-14 NOTE — Progress Notes (Signed)
Received call from [facility].  They are reviewing patient for possible inpatient placement.  Jason [name]. Kaylyn Lim, MSW, LCSWA Disposition Clinical Social Work [phone] (cell) [phone] (office)

## 2019-01-14 NOTE — ED Notes (Signed)
Phone call from [facility]. Stated that facility has "administrativly refused" patient despite having prior approval

## 2019-01-14 NOTE — ED Notes (Signed)
Pt threw chair outside the room. Pt never verbalized that he was upset.

## 2019-01-14 NOTE — ED Notes (Signed)
Patient asking for peanut butter and diet drink. Checked patient's CBG. Patient given peanut butter with crackers and Sprite zero at this time.

## 2019-01-14 NOTE — ED Notes (Signed)
Pt agitated at this time and anxious, states he needs his ativan. Informed pt it was not time for his ativan. Reassured pt [facility] is still looking for placement for him. Pts attention redirected to tv, pt calmed.

## 2019-01-15 DIAGNOSIS — F209 Schizophrenia, unspecified: Secondary | ICD-10-CM | POA: Diagnosis not present

## 2019-01-15 LAB — VALPROIC ACID LEVEL: Valproic Acid Lvl: 33 ug/mL — ABNORMAL LOW (ref 50.0–100.0)

## 2019-01-15 LAB — CBG MONITORING, ED
Glucose-Capillary: 290 mg/dL — ABNORMAL HIGH (ref 70–99)
Glucose-Capillary: 266 mg/dL — ABNORMAL HIGH (ref 70–99)
Glucose-Capillary: 313 mg/dL — ABNORMAL HIGH (ref 70–99)
Glucose-Capillary: 325 mg/dL — ABNORMAL HIGH (ref 70–99)
Glucose-Capillary: 313 mg/dL — ABNORMAL HIGH (ref 70–99)

## 2019-01-15 LAB — BASIC METABOLIC PANEL
Anion gap: 12 (ref 5–15)
BUN: 17 mg/dL (ref 8–23)
CO2: 29 mmol/L (ref 22–32)
Calcium: 9.2 mg/dL (ref 8.9–10.3)
Chloride: 94 mmol/L — ABNORMAL LOW (ref 98–111)
Creatinine, Ser: 1.12 mg/dL (ref 0.61–1.24)
GFR calc non Af Amer: >60 mL/min (ref >60)
Glucose, Bld: 303 mg/dL — ABNORMAL HIGH (ref 70–99)
Potassium: 3.2 mmol/L — ABNORMAL LOW (ref 3.5–5.1)
Sodium: 135 mmol/L (ref 135–145)

## 2019-01-15 MED ORDER — EZETIMIBE 10 MG PO TABS
10.0000 mg | ORAL_TABLET | Freq: Every day | ORAL | Status: DC
  Administered 2019-01-16 – 2019-01-17 (×2): 10 mg via ORAL
  Filled 2019-01-15 (×5): qty 1

## 2019-01-15 MED ORDER — CARVEDILOL 12.5 MG PO TABS
6.2500 mg | ORAL_TABLET | Freq: Two times a day (BID) | ORAL | Status: DC
  Administered 2019-01-15 – 2019-01-18 (×6): 6.25 mg via ORAL
  Filled 2019-01-15 (×7): qty 1

## 2019-01-15 MED ORDER — BENZTROPINE MESYLATE 1 MG PO TABS
0.5000 mg | ORAL_TABLET | Freq: Two times a day (BID) | ORAL | Status: DC
  Administered 2019-01-15 – 2019-01-18 (×6): 0.5 mg via ORAL
  Filled 2019-01-15 (×7): qty 1

## 2019-01-15 MED ORDER — INSULIN ASPART 100 UNIT/ML ~~LOC~~ SOLN
0.0000 [IU] | Freq: Three times a day (TID) | SUBCUTANEOUS | Status: DC
  Administered 2019-01-15: 11 [IU] via SUBCUTANEOUS
  Administered 2019-01-16: 5 [IU] via SUBCUTANEOUS
  Administered 2019-01-16: 11 [IU] via SUBCUTANEOUS
  Administered 2019-01-16: 8 [IU] via SUBCUTANEOUS
  Administered 2019-01-17: 3 [IU] via SUBCUTANEOUS
  Administered 2019-01-17: 5 [IU] via SUBCUTANEOUS
  Administered 2019-01-17: 8 [IU] via SUBCUTANEOUS
  Administered 2019-01-18: 5 [IU] via SUBCUTANEOUS
  Filled 2019-01-15 (×8): qty 1

## 2019-01-15 MED ORDER — TAMSULOSIN HCL 0.4 MG PO CAPS
0.4000 mg | ORAL_CAPSULE | Freq: Every day | ORAL | Status: DC
  Administered 2019-01-15 – 2019-01-18 (×4): 0.4 mg via ORAL
  Filled 2019-01-15 (×4): qty 1

## 2019-01-15 MED ORDER — RISPERIDONE 1 MG PO TABS
1.5000 mg | ORAL_TABLET | Freq: Two times a day (BID) | ORAL | Status: DC
  Administered 2019-01-15 – 2019-01-18 (×6): 1.5 mg via ORAL
  Filled 2019-01-15 (×7): qty 2

## 2019-01-15 MED ORDER — ATORVASTATIN CALCIUM 40 MG PO TABS
80.0000 mg | ORAL_TABLET | Freq: Every day | ORAL | Status: DC
  Administered 2019-01-16 – 2019-01-17 (×2): 80 mg via ORAL
  Filled 2019-01-15 (×3): qty 2

## 2019-01-15 MED ORDER — INSULIN DEGLUDEC 100 UNIT/ML ~~LOC~~ SOPN: 30.0000 [IU] | PEN_INJECTOR | Freq: Every day | SUBCUTANEOUS | Status: DC

## 2019-01-15 MED ORDER — ISOSORBIDE MONONITRATE ER 60 MG PO TB24
60.0000 mg | ORAL_TABLET | Freq: Every day | ORAL | Status: DC
  Administered 2019-01-15 – 2019-01-17 (×3): 60 mg via ORAL
  Filled 2019-01-15 (×6): qty 1

## 2019-01-15 MED ORDER — DIVALPROEX SODIUM ER 500 MG PO TB24
1000.0000 mg | ORAL_TABLET | Freq: Every day | ORAL | Status: DC
  Administered 2019-01-15 – 2019-01-17 (×3): 1000 mg via ORAL
  Filled 2019-01-15 (×3): qty 2

## 2019-01-15 MED ORDER — METFORMIN HCL 500 MG PO TABS
1000.0000 mg | ORAL_TABLET | Freq: Two times a day (BID) | ORAL | Status: DC
  Administered 2019-01-15 – 2019-01-18 (×6): 1000 mg via ORAL
  Filled 2019-01-15 (×6): qty 2

## 2019-01-15 MED ORDER — METOLAZONE 2.5 MG PO TABS
2.5000 mg | ORAL_TABLET | Freq: Every day | ORAL | Status: DC
  Administered 2019-01-15 – 2019-01-18 (×4): 2.5 mg via ORAL
  Filled 2019-01-15 (×6): qty 1

## 2019-01-15 MED ORDER — INSULIN GLARGINE 100 UNIT/ML ~~LOC~~ SOLN
30.0000 [IU] | Freq: Every day | SUBCUTANEOUS | Status: DC
  Administered 2019-01-15 – 2019-01-16 (×2): 30 [IU] via SUBCUTANEOUS
  Filled 2019-01-15 (×4): qty 0.3

## 2019-01-15 MED ORDER — INSULIN ASPART 100 UNIT/ML ~~LOC~~ SOLN
0.0000 [IU] | Freq: Three times a day (TID) | SUBCUTANEOUS | Status: DC
  Administered 2019-01-15: 5 [IU] via SUBCUTANEOUS

## 2019-01-15 MED ORDER — AMLODIPINE BESYLATE 5 MG PO TABS
10.0000 mg | ORAL_TABLET | Freq: Every day | ORAL | Status: DC
  Administered 2019-01-15 – 2019-01-18 (×4): 10 mg via ORAL
  Filled 2019-01-15 (×4): qty 2

## 2019-01-15 MED ORDER — FUROSEMIDE 40 MG PO TABS
40.0000 mg | ORAL_TABLET | Freq: Two times a day (BID) | ORAL | Status: DC
  Administered 2019-01-15 – 2019-01-18 (×7): 40 mg via ORAL
  Filled 2019-01-15 (×7): qty 1

## 2019-01-15 MED ORDER — GLIPIZIDE 5 MG PO TABS
5.0000 mg | ORAL_TABLET | Freq: Every day | ORAL | Status: DC
  Administered 2019-01-17 – 2019-01-18 (×2): 5 mg via ORAL
  Filled 2019-01-15 (×7): qty 1

## 2019-01-15 MED ORDER — CLOPIDOGREL BISULFATE 75 MG PO TABS
75.0000 mg | ORAL_TABLET | Freq: Every day | ORAL | Status: DC
  Administered 2019-01-15 – 2019-01-18 (×3): 75 mg via ORAL
  Filled 2019-01-15 (×3): qty 1

## 2019-01-15 MED ORDER — GABAPENTIN 300 MG PO CAPS
300.0000 mg | ORAL_CAPSULE | Freq: Three times a day (TID) | ORAL | Status: DC
  Administered 2019-01-15 – 2019-01-18 (×9): 300 mg via ORAL
  Filled 2019-01-15 (×10): qty 1

## 2019-01-15 NOTE — ED Notes (Signed)
Pt refused night time meds he stated "he does not want them and does not want to be bothered again, I am tired".

## 2019-01-15 NOTE — BHH Counselor (Signed)
TTS reassessment: Patient is alert and oriented x 4. He is dressed in scrubs and laying in bed. His mood is depressed/ anxious, affect congruent. Patient's voice is trembling and soft. He reports feeling anxious and agitated. He denies suicidal ideation at this time. Continues to be preoccupied with anger toward med tech. Reports his last AH was 2 days ago. Per RN notes, patient has been agitated in ED. Garment/textile technologist. Continues to meet in patient. TTS will continue to seek placement.

## 2019-01-15 NOTE — Progress Notes (Addendum)
Inpatient Diabetes Program Recommendations  AACE/ADA: New Consensus Statement on Inpatient Glycemic Control  Target Ranges:  Prepandial:   less than 140 mg/dL      Peak postprandial:   less than 180 mg/dL (1-2 hours)      Critically ill patients:  140 - 180 mg/dL  Results for Jason [name], Jason [name] (MRN [id number]) as of 01/15/2019 09:15  Ref. Range 01/14/2019 02:42 01/15/2019 06:24 01/15/2019 09:09  Glucose-Capillary Latest Ref Range: 70 - 99 mg/dL 021 (H) 117 (H) 356 (H)   Results for Jason [name], Jason [name] (MRN [id number]) as of 01/15/2019 09:15  Ref. Range 01/13/2019 20:42 01/15/2019 07:26  Glucose Latest Ref Range: 70 - 99 mg/dL 314 (H) 388 (H)   Review of Glycemic Control  Diabetes history: DM2 Outpatient Diabetes medications: Tresiba 30 units QHS, Metformin 1000 mg BID, Glipizide 5 mg daily, Humalog 57 units TID with meals Current orders for Inpatient glycemic control: Tresiba 30 units QHS, Novolog 0-9 units TID with meals, Glipizide 5 mg daily, Metformin 1000 mg BID  Inpatient Diabetes Program Recommendations:   Insulin - Basal: Please discontinue Evaristo Bury (not on formulary) and order Lantus 30 units daily (to start now). Correction (SSI): Please consider increasing Novolog correction to moderate scale (0-15 units) with frequency of AC&HS. Insulin-Meal Coverage: Please consider ordering Novolog 6 units TID with meals for meal coverage if postprandial glucose is consistently greater than 180 mg/dl.  NOTE: Noted consult for Diabetes Coordinator. Chart reviewed. Patient from group home and holding in Emergency Department for [facility] placement. Recommendations noted above. Will send secure chat message to Dr. Manus Gunning with recommendations.  Thanks, Orlando Penner, RN, MSN, CDE Diabetes Coordinator Inpatient Diabetes Program [phone] (Team Pager from 8am to 5pm)

## 2019-01-16 ENCOUNTER — Emergency Department (HOSPITAL_COMMUNITY): Payer: Medicare Other

## 2019-01-16 ENCOUNTER — Other Ambulatory Visit: Payer: Self-pay

## 2019-01-16 DIAGNOSIS — F209 Schizophrenia, unspecified: Secondary | ICD-10-CM | POA: Diagnosis not present

## 2019-01-16 LAB — COMPREHENSIVE METABOLIC PANEL
ALT: 15 U/L (ref 0–44)
AST: 20 U/L (ref 15–41)
Albumin: 3.5 g/dL (ref 3.5–5.0)
Alkaline Phosphatase: 53 U/L (ref 38–126)
Anion gap: 14 (ref 5–15)
BUN: 23 mg/dL (ref 8–23)
CHLORIDE: 91 mmol/L — AB (ref 98–111)
CO2: 29 mmol/L (ref 22–32)
Calcium: 9 mg/dL (ref 8.9–10.3)
Creatinine, Ser: 1.33 mg/dL — ABNORMAL HIGH (ref 0.61–1.24)
GFR calc Af Amer: >60 mL/min (ref >60)
GFR calc non Af Amer: 56 mL/min — ABNORMAL LOW (ref >60)
Glucose, Bld: 311 mg/dL — ABNORMAL HIGH (ref 70–99)
Potassium: 3.2 mmol/L — ABNORMAL LOW (ref 3.5–5.1)
Sodium: 134 mmol/L — ABNORMAL LOW (ref 135–145)
Total Bilirubin: 0.5 mg/dL (ref 0.3–1.2)
Total Protein: 6.5 g/dL (ref 6.5–8.1)

## 2019-01-16 LAB — CBG MONITORING, ED
Glucose-Capillary: 312 mg/dL — ABNORMAL HIGH (ref 70–99)
Glucose-Capillary: 236 mg/dL — ABNORMAL HIGH (ref 70–99)
Glucose-Capillary: 269 mg/dL — ABNORMAL HIGH (ref 70–99)
Glucose-Capillary: 320 mg/dL — ABNORMAL HIGH (ref 70–99)

## 2019-01-16 LAB — CBC WITH DIFFERENTIAL/PLATELET
ABS IMMATURE GRANULOCYTES: 0.05 10*3/uL (ref 0.00–0.07)
Basophils Absolute: 0 10*3/uL (ref 0.0–0.1)
Basophils Relative: 0 %
Eosinophils Absolute: 1 10*3/uL — ABNORMAL HIGH (ref 0.0–0.5)
Eosinophils Relative: 11 %
HCT: 34.7 % — ABNORMAL LOW (ref 39.0–52.0)
Hemoglobin: 11.5 g/dL — ABNORMAL LOW (ref 13.0–17.0)
Immature Granulocytes: 1 %
Lymphocytes Relative: 19 %
Lymphs Abs: 1.6 10*3/uL (ref 0.7–4.0)
MCH: 28.6 pg (ref 26.0–34.0)
MCHC: 33.1 g/dL (ref 30.0–36.0)
MCV: 86.3 fL (ref 80.0–100.0)
MONOS PCT: 11 %
Monocytes Absolute: 0.9 10*3/uL (ref 0.1–1.0)
NEUTROS ABS: 5 10*3/uL (ref 1.7–7.7)
NEUTROS PCT: 58 %
Platelets: 178 10*3/uL (ref 150–400)
RBC: 4.02 MIL/uL — ABNORMAL LOW (ref 4.22–5.81)
RDW: 14.6 % (ref 11.5–15.5)
WBC: 8.6 10*3/uL (ref 4.0–10.5)
nRBC: 0 % (ref 0.0–0.2)

## 2019-01-16 LAB — TROPONIN I
Troponin I: <0.03 ng/mL (ref <0.03)
Troponin I: <0.03 ng/mL (ref <0.03)

## 2019-01-16 MED ORDER — INSULIN ASPART 100 UNIT/ML ~~LOC~~ SOLN: 0.0000 [IU] | Freq: Every evening | SUBCUTANEOUS | Status: DC | PRN

## 2019-01-16 MED ORDER — POTASSIUM CHLORIDE CRYS ER 20 MEQ PO TBCR
40.0000 meq | EXTENDED_RELEASE_TABLET | Freq: Once | ORAL | Status: DC
  Filled 2019-01-16 (×2): qty 2

## 2019-01-16 MED ORDER — NITROGLYCERIN 0.4 MG SL SUBL: 0.4000 mg | SUBLINGUAL_TABLET | SUBLINGUAL | Status: DC | PRN

## 2019-01-16 MED ORDER — INSULIN ASPART 100 UNIT/ML ~~LOC~~ SOLN
11.0000 [IU] | Freq: Once | SUBCUTANEOUS | Status: AC
  Administered 2019-01-16: 11 [IU] via SUBCUTANEOUS
  Filled 2019-01-16: qty 1

## 2019-01-16 MED ORDER — INSULIN GLARGINE 100 UNIT/ML ~~LOC~~ SOLN
40.0000 [IU] | Freq: Every day | SUBCUTANEOUS | Status: DC
  Administered 2019-01-17 – 2019-01-18 (×2): 40 [IU] via SUBCUTANEOUS
  Filled 2019-01-16 (×3): qty 0.4

## 2019-01-16 MED ORDER — ASPIRIN 81 MG PO CHEW
324.0000 mg | CHEWABLE_TABLET | Freq: Once | ORAL | Status: AC
  Administered 2019-01-16: 324 mg via ORAL
  Filled 2019-01-16: qty 4

## 2019-01-16 NOTE — ED Notes (Signed)
Patient transported to X-ray with sitter 

## 2019-01-16 NOTE — ED Notes (Signed)
Pt refused potassium tablets, stated "he didn't want anymore medicine tonight." This RN explained why he needed the medication, pt still refused.

## 2019-01-16 NOTE — Progress Notes (Signed)
CSW contacted [employer] [facility] [employer] (at [facility]) regarding acceptance note that was written on 01/13/2019. CSW was informed by Cat (intake department at [facility]) that pt had later been administratively declined and this information had been given to covering nurse. CSW (at 09:50) asked if referral could be resent as they had bed availability. Cat stated that she would call her supervisor to gain some insight regarding why he had been declined after being accepted. Cat called back (at 10:01) to let CSW know that pt had been there in the past for three or four months and therefore the administration had not felt that there program would not benefit him.   CSW contacted pt guardian Magdalene River (at 09:41) at [phone] and left a HIPPA compliant voicemail.  CSW contacted [facility] (at 09:46) regarding referral and was informed that it was still under review.   As advised by message from [facility] the "[facility]" Crisis Line was contacted (at 09:54). Annie Sable [employer]) was informed that patient had been admitted here and that placement was being sought. She was informed that pt is beginning to get better but stated that if he returns to [facility] then he would do the same thing again. Tonny Branch and CSW discussed pt's desire for another placement. Tonny Branch informed CSW that Lorayne Marek would be updated on Monday regarding this issue as it could not be addressed until then.   Vilma Meckel. Algis Greenhouse, MSW, LCSW Clinical Social Work/Disposition Phone: [phone] Fax: [phone]

## 2019-01-16 NOTE — Progress Notes (Addendum)
Inpatient Diabetes Program Recommendations  AACE/ADA: New Consensus Statement on Inpatient Glycemic Control   Target Ranges:  Prepandial:   less than 140 mg/dL      Peak postprandial:   less than 180 mg/dL (1-2 hours)      Critically ill patients:  140 - 180 mg/dL     Review of Glycemic Control  Diabetes history: DM2 Outpatient Diabetes medications: Tresiba 30 units QHS, Metformin 1000 mg BID, Glipizide 5 mg daily, Humalog 57 units TID with meals Current orders for Inpatient glycemic control: Lantus 30 units QHS, Novolog 0-15 units TID with meals, Glipizide 5 mg daily, Metformin 1000 mg BID  Inpatient Diabetes Program Recommendations:   Insulin - Basal: Please consider increasing Lantus to 40 units daily.  Correction (SSI): Please consider adding Novolog 0-5 units QHS for bedtime correction.  Insulin-Meal Coverage: Please consider ordering Novolog 10 units TID with meals for meal coverage if patient eats at least 50% of meals.  Thanks, Orlando Penner, RN, MSN, CDE Diabetes Coordinator Inpatient Diabetes Program [phone] (Team Pager from 8am to 5pm)

## 2019-01-16 NOTE — ED Provider Notes (Signed)
Patient with history of extensive coronary artery disease awaiting placement from the emergency department began having chest tightness this afternoon.  No radiation.  No shortness of breath. Will get aspirin and nitroglycerin and repeat labs.  EKG with right bundle branch block but no concerning findings.  Troponin x2 is normal.  Patient still has mild hypokalemia.  Patient stated to nurse that he was concerned about going back to his group home on this is causing him to become anxious and symptomatic. Patient is reassured.  Medically cleared for placement.    Loren Racer, MD 01/16/19 2246
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
## 2019-01-17 DIAGNOSIS — F209 Schizophrenia, unspecified: Secondary | ICD-10-CM | POA: Diagnosis not present

## 2019-01-17 LAB — CBG MONITORING, ED
Glucose-Capillary: 229 mg/dL — ABNORMAL HIGH (ref 70–99)
Glucose-Capillary: 257 mg/dL — ABNORMAL HIGH (ref 70–99)
Glucose-Capillary: 155 mg/dL — ABNORMAL HIGH (ref 70–99)
Glucose-Capillary: 178 mg/dL — ABNORMAL HIGH (ref 70–99)
Glucose-Capillary: 235 mg/dL — ABNORMAL HIGH (ref 70–99)

## 2019-01-17 NOTE — Progress Notes (Signed)
CSW contacted referral facilities with the following results:  Still reviewing: [facility] (resent per request) [facility]  Declined: [facility] (no bed availability until Tuesday) [facility] (at capacity) [facility] (no programming for patient) [facility] (administrative denial due to previous stay at this facility) [facility] (medical denial)  TTS will continue to seek placement.  Vilma Meckel. Algis Greenhouse, MSW, LCSW Clinical Social Work/Disposition Phone: [phone] Fax: [phone]

## 2019-01-17 NOTE — Progress Notes (Signed)
Inpatient Diabetes Program Recommendations  AACE/ADA: New Consensus Statement on Inpatient Glycemic Control   Target Ranges:  Prepandial:   less than 140 mg/dL      Peak postprandial:   less than 180 mg/dL (1-2 hours)      Critically ill patients:  140 - 180 mg/dL   Results for Jason [name], [name] (MRN [id number]) as of 01/17/2019 08:51  Ref. Range 01/16/2019 08:23 01/16/2019 12:15 01/16/2019 16:51 01/16/2019 21:35 01/17/2019 07:42  Glucose-Capillary Latest Ref Range: 70 - 99 mg/dL 875 (H) 643 (H) 329 (H) 320 (H) 229 (H)   Review of Glycemic Control  Diabetes history:DM2 Outpatient Diabetes medications:Tresiba 30 units QHS, Metformin 1000 mg BID, Glipizide 5 mg daily, Humalog 57 units TID with meals Current orders for Inpatient glycemic control:Lantus 40 units QHS, Novolog 0-15 units TID with meals, Novolog 0-5 units QHS, Glipizide 5 mg daily, Metformin 1000 mg BID  Inpatient Diabetes Program Recommendations:  Insulin-Meal Coverage: Please consider ordering Novolog 10 units TID with meals for meal coverage if patient eats at least 50% of meals.  Thanks, Orlando Penner, RN, MSN, CDE Diabetes Coordinator Inpatient Diabetes Program [phone] (Team Pager from 8am to 5pm)

## 2019-01-17 NOTE — BHH Counselor (Addendum)
  REASSESSMENT- Pt observed laying in the bed and was easily aroused by nurse.  Pt became alert and oriented X 3. Pt denies SI.  Pt stated "I feel better today. I started feeling better the other day as long as I don't have to go back to that horrible place.  I don't want to hurt myself no more, I got to find a place to go."  Pt denies HI/ A/V- hallucinations.  TTS and CSW will continue to look for placement for patient.  [facility] observed that the pt had noticeable arm and leg tremors during that reassessment.  Pt's nurse was informed of the physical observations.  Omarrion Carmer L. Arynn Armand, MS, [facility], [facility] Therapeutic Triage Specialist  [phone]

## 2019-01-17 NOTE — ED Notes (Signed)
Avasyst telemonitoring began for patient.

## 2019-01-17 NOTE — ED Notes (Signed)
Pt given dinner meal tray

## 2019-01-18 ENCOUNTER — Other Ambulatory Visit: Payer: Self-pay

## 2019-01-18 ENCOUNTER — Encounter (HOSPITAL_COMMUNITY): Payer: Self-pay

## 2019-01-18 ENCOUNTER — Emergency Department (HOSPITAL_COMMUNITY)
Admission: EM | Admit: 2019-01-18 | Discharge: 2019-02-01 | Disposition: A | Discharge status: Skilled Nursing Facility | Payer: Medicare Other | Attending: Emergency Medicine | Admitting: Emergency Medicine

## 2019-01-18 DIAGNOSIS — Z23 Encounter for immunization: Secondary | ICD-10-CM | POA: Insufficient documentation

## 2019-01-18 DIAGNOSIS — F333 Major depressive disorder, recurrent, severe with psychotic symptoms: Secondary | ICD-10-CM | POA: Insufficient documentation

## 2019-01-18 DIAGNOSIS — I10 Essential (primary) hypertension: Secondary | ICD-10-CM | POA: Insufficient documentation

## 2019-01-18 DIAGNOSIS — Z7901 Long term (current) use of anticoagulants: Secondary | ICD-10-CM | POA: Insufficient documentation

## 2019-01-18 DIAGNOSIS — Z7982 Long term (current) use of aspirin: Secondary | ICD-10-CM | POA: Diagnosis not present

## 2019-01-18 DIAGNOSIS — Z794 Long term (current) use of insulin: Secondary | ICD-10-CM | POA: Diagnosis not present

## 2019-01-18 DIAGNOSIS — Z8673 Personal history of transient ischemic attack (TIA), and cerebral infarction without residual deficits: Secondary | ICD-10-CM | POA: Diagnosis not present

## 2019-01-18 DIAGNOSIS — Z79899 Other long term (current) drug therapy: Secondary | ICD-10-CM | POA: Insufficient documentation

## 2019-01-18 DIAGNOSIS — R45851 Suicidal ideations: Secondary | ICD-10-CM | POA: Diagnosis not present

## 2019-01-18 DIAGNOSIS — E119 Type 2 diabetes mellitus without complications: Secondary | ICD-10-CM | POA: Diagnosis not present

## 2019-01-18 DIAGNOSIS — F79 Unspecified intellectual disabilities: Secondary | ICD-10-CM

## 2019-01-18 DIAGNOSIS — R531 Weakness: Secondary | ICD-10-CM | POA: Insufficient documentation

## 2019-01-18 LAB — CBG MONITORING, ED
Glucose-Capillary: 217 mg/dL — ABNORMAL HIGH (ref 70–99)
Glucose-Capillary: 256 mg/dL — ABNORMAL HIGH (ref 70–99)

## 2019-01-18 LAB — RAPID URINE DRUG SCREEN, HOSP PERFORMED
Amphetamines: NOT DETECTED
Barbiturates: NOT DETECTED
Benzodiazepines: POSITIVE — AB
Cocaine: NOT DETECTED
Opiates: NOT DETECTED
Tetrahydrocannabinol: NOT DETECTED

## 2019-01-18 MED ORDER — EZETIMIBE 10 MG PO TABS
10.0000 mg | ORAL_TABLET | Freq: Every day | ORAL | Status: DC
  Administered 2019-01-19 – 2019-02-01 (×13): 10 mg via ORAL
  Filled 2019-01-18 (×16): qty 1

## 2019-01-18 MED ORDER — ATORVASTATIN CALCIUM 40 MG PO TABS
80.0000 mg | ORAL_TABLET | Freq: Every day | ORAL | Status: DC
  Administered 2019-01-19: 80 mg via ORAL
  Administered 2019-01-19: 40 mg via ORAL
  Administered 2019-01-20 – 2019-01-31 (×13): 80 mg via ORAL
  Filled 2019-01-18 (×15): qty 2

## 2019-01-18 MED ORDER — FUROSEMIDE 40 MG PO TABS
40.0000 mg | ORAL_TABLET | Freq: Two times a day (BID) | ORAL | Status: DC
  Administered 2019-01-19 – 2019-02-01 (×27): 40 mg via ORAL
  Filled 2019-01-18 (×27): qty 1

## 2019-01-18 MED ORDER — OLANZAPINE 5 MG PO TBDP
10.0000 mg | ORAL_TABLET | Freq: Three times a day (TID) | ORAL | Status: DC | PRN
  Administered 2019-01-25: 10 mg via ORAL
  Filled 2019-01-18 (×2): qty 2

## 2019-01-18 MED ORDER — LORAZEPAM 0.5 MG PO TABS
0.5000 mg | ORAL_TABLET | Freq: Three times a day (TID) | ORAL | Status: DC
  Administered 2019-01-19 – 2019-02-01 (×37): 0.5 mg via ORAL
  Filled 2019-01-18 (×39): qty 1

## 2019-01-18 MED ORDER — AMLODIPINE BESYLATE 5 MG PO TABS
10.0000 mg | ORAL_TABLET | Freq: Every day | ORAL | Status: DC
  Administered 2019-01-19 – 2019-02-01 (×14): 10 mg via ORAL
  Filled 2019-01-18 (×14): qty 2

## 2019-01-18 MED ORDER — GABAPENTIN 300 MG PO CAPS
300.0000 mg | ORAL_CAPSULE | Freq: Three times a day (TID) | ORAL | Status: DC
  Administered 2019-01-19 – 2019-02-01 (×39): 300 mg via ORAL
  Filled 2019-01-18 (×39): qty 1

## 2019-01-18 MED ORDER — DIVALPROEX SODIUM ER 500 MG PO TB24
1000.0000 mg | ORAL_TABLET | Freq: Every day | ORAL | Status: DC
  Administered 2019-01-19 – 2019-01-31 (×13): 1000 mg via ORAL
  Filled 2019-01-18 (×13): qty 2

## 2019-01-18 MED ORDER — INSULIN DEGLUDEC 100 UNIT/ML ~~LOC~~ SOPN
30.0000 [IU] | PEN_INJECTOR | Freq: Every day | SUBCUTANEOUS | Status: DC
  Filled 2019-01-18: qty 3

## 2019-01-18 MED ORDER — METOLAZONE 2.5 MG PO TABS
2.5000 mg | ORAL_TABLET | Freq: Every day | ORAL | Status: DC
  Administered 2019-01-19 – 2019-02-01 (×14): 2.5 mg via ORAL
  Filled 2019-01-18 (×17): qty 1

## 2019-01-18 MED ORDER — ASPIRIN EC 325 MG PO TBEC
325.0000 mg | DELAYED_RELEASE_TABLET | Freq: Every day | ORAL | Status: DC
  Administered 2019-01-19 – 2019-02-01 (×14): 325 mg via ORAL
  Filled 2019-01-18 (×14): qty 1

## 2019-01-18 MED ORDER — ISOSORBIDE MONONITRATE ER 60 MG PO TB24
60.0000 mg | ORAL_TABLET | Freq: Every day | ORAL | Status: DC
  Administered 2019-01-19 – 2019-02-01 (×13): 60 mg via ORAL
  Filled 2019-01-18 (×16): qty 1

## 2019-01-18 MED ORDER — GLIPIZIDE 5 MG PO TABS
5.0000 mg | ORAL_TABLET | Freq: Every day | ORAL | Status: DC
  Administered 2019-01-19 – 2019-02-01 (×14): 5 mg via ORAL
  Filled 2019-01-18 (×16): qty 1

## 2019-01-18 MED ORDER — TAMSULOSIN HCL 0.4 MG PO CAPS
0.4000 mg | ORAL_CAPSULE | Freq: Every day | ORAL | Status: DC
  Administered 2019-01-19 – 2019-02-01 (×14): 0.4 mg via ORAL
  Filled 2019-01-18 (×13): qty 1

## 2019-01-18 MED ORDER — CLOPIDOGREL BISULFATE 75 MG PO TABS
75.0000 mg | ORAL_TABLET | Freq: Every day | ORAL | Status: DC
  Administered 2019-01-19 – 2019-02-01 (×14): 75 mg via ORAL
  Filled 2019-01-18 (×14): qty 1

## 2019-01-18 MED ORDER — CARVEDILOL 12.5 MG PO TABS
6.2500 mg | ORAL_TABLET | Freq: Two times a day (BID) | ORAL | Status: DC
  Administered 2019-01-19 – 2019-02-01 (×28): 6.25 mg via ORAL
  Filled 2019-01-18 (×29): qty 1

## 2019-01-18 MED ORDER — LORAZEPAM 1 MG PO TABS
1.0000 mg | ORAL_TABLET | ORAL | Status: AC | PRN
  Administered 2019-01-20: 0.5 mg via ORAL

## 2019-01-18 MED ORDER — ZIPRASIDONE MESYLATE 20 MG IM SOLR: 20.0000 mg | INTRAMUSCULAR | Status: DC | PRN

## 2019-01-18 MED ORDER — METFORMIN HCL 500 MG PO TABS
1000.0000 mg | ORAL_TABLET | Freq: Two times a day (BID) | ORAL | Status: DC
  Administered 2019-01-19 – 2019-02-01 (×27): 1000 mg via ORAL
  Filled 2019-01-18 (×26): qty 2

## 2019-01-18 MED ORDER — PIMOZIDE 1 MG PO TABS
1.0000 mg | ORAL_TABLET | Freq: Two times a day (BID) | ORAL | Status: DC
  Filled 2019-01-18 (×8): qty 1

## 2019-01-18 MED ORDER — BENZTROPINE MESYLATE 1 MG PO TABS
0.5000 mg | ORAL_TABLET | Freq: Two times a day (BID) | ORAL | Status: DC
  Administered 2019-01-19 – 2019-02-01 (×28): 0.5 mg via ORAL
  Filled 2019-01-18 (×28): qty 1

## 2019-01-18 MED ORDER — POTASSIUM CHLORIDE CRYS ER 20 MEQ PO TBCR
20.0000 meq | EXTENDED_RELEASE_TABLET | Freq: Two times a day (BID) | ORAL | Status: DC
  Administered 2019-01-19 – 2019-01-29 (×23): 20 meq via ORAL
  Filled 2019-01-18 (×22): qty 1

## 2019-01-18 MED ORDER — RISPERIDONE 1 MG PO TABS
1.5000 mg | ORAL_TABLET | Freq: Two times a day (BID) | ORAL | Status: DC
  Administered 2019-01-19 – 2019-02-01 (×26): 1.5 mg via ORAL
  Filled 2019-01-18 (×27): qty 2

## 2019-01-18 NOTE — ED Provider Notes (Signed)
Patient has been cleared by psychiatry team.  He will be discharged to group home. No further recommendations by psych team at the moment for [place] to follow-up on.   Jason Kaplan, MD 01/18/19 [phone]

## 2019-01-18 NOTE — Progress Notes (Signed)
Patient was seen by me via tele-psych and I have consulted with Dr. Lucianne Muss.  Patient denies any suicidal homicidal ideations and denies any hallucinations.  Patient states that he has been having some good days and feels much better, but has felt weak today and states that he had to use the wheelchair to go to the bathroom.  He continues repeating that he does not want to go back to the group home that he does not like it there and it is "a horrible place."  This would make the secondary that the patient has denied any suicidal homicidal ideations and has denied any hallucinations.  Disposition staff contacted patient's legal guardian and states that he should be going back to the group home and that he reports that every group home he has ever stayed and is a horrible place and he does not want to go back there.  I have requested through the RN to have the patient's legal guardian contact him by phone so that they can discuss this with him I attempted to reinforce the thought that the hospital and the staff do not have the ability to change the legal guardians plan for discharge as this is for the best safety of the patient.  The RN also reported me that the patient has not required a wheelchair but because he was reporting feeling weak he was classified as a fall risk and they were using a wheelchair to assist him to the bathroom, but the patient has been out and walking in the hallway and frequently today.  At this time patient does not meet inpatient criteria and is psychiatrically cleared.  I have contacted Dr. Jodi Mourning and notified him of the recommendations.

## 2019-01-18 NOTE — ED Notes (Signed)
Have given pt a meal tray  
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
## 2019-01-18 NOTE — Progress Notes (Signed)
CSW left HIPAA compliant voice message with [facility] administrator, Jimmye Norman. CSW spoke with [facility] worker Stanton Kidney and assessed that Ms. Ray is busy with "appointments" but knows that pt needs to be picked up and will come get pt as, "soon as she is done with her other appointments".   CSW will continue trying to contact Ms. Ray in an attempt to gain a more specific time of arrival.   Bethanne Ginger Disposition CSW [facility]/TTS [phone]

## 2019-01-18 NOTE — Progress Notes (Signed)
Inpatient Diabetes Program Recommendations  AACE/ADA: New Consensus Statement on Inpatient Glycemic Control (2015)  Target Ranges:  Prepandial:   less than 140 mg/dL      Peak postprandial:   less than 180 mg/dL (1-2 hours)      Critically ill patients:  140 - 180 mg/dL   Lab Results  Component Value Date   GLUCAP 256 (H) 01/18/2019    Review of Glycemic Control  Diabetes history:DM2 Outpatient Diabetes medications:Tresiba 30 units QHS, Metformin 1000 mg BID, Glipizide 5 mg daily, Humalog 57 units TID with meals Current orders for Inpatient glycemic control:Lantus 40units QHS, Novolog 0-15units TID with meals, Novolog 0-5 units QHS, Glipizide 5 mg daily, Metformin 1000 mg BID  Inpatient Diabetes Program Recommendations: CBGs continually elevated. Insulin-Meal Coverage: Please consider ordering Novolog10units TID with meals for meal coverage if patient eats at least 50% of meals.  Thank you, Jason [name]. Jason Hilbun, RN, MSN, CDE  Diabetes Coordinator Inpatient Glycemic Control Team Team Pager [phone] (8am-5pm) 01/18/2019 12:55 PM

## 2019-01-18 NOTE — ED Provider Notes (Addendum)
[facility] EMERGENCY DEPARTMENT Provider Note   CSN: [id number] Arrival date & time: 01/18/19  2258    History   Chief Complaint Chief Complaint  Patient presents with  . V70.1    HPI BROADUS [name] is a 68 y.o. male.     Patient sent back to the emergency department after being discharged earlier today.  He was evaluated by psychiatry team and felt safe for discharge.  After going back to his group home he began to make comments about killing himself as well as the med tech Jason [name] who works at the group home.     Past Medical History:  Diagnosis Date  . Diabetes mellitus without complication (HCC)   . GERD (gastroesophageal reflux disease)   . Hypertension   . Paranoid schizophrenia (HCC)   . Stroke (HCC)   . Tourette disorder     Patient Active Problem List   Diagnosis Date Noted  . Community acquired pneumonia 10/14/2018  . Dizziness 08/25/2018  . Hypokalemia due to inadequate potassium intake 08/16/2018  . Hypertension 08/07/2018  . Malingering 08/07/2018  . Intellectual disability 08/07/2018  . Paranoid schizophrenia (HCC) 07/23/2018  . Diabetes mellitus without complication (HCC) 07/23/2018  . Tourette disorder 07/23/2018    Past Surgical History:  Procedure Laterality Date  . CARDIAC SURGERY    . CORONARY ARTERY BYPASS GRAFT    . HAND SURGERY    . TONSILLECTOMY          Home Medications    Prior to Admission medications   Medication Sig Start Date End Date Taking? Authorizing Provider  acetaminophen (TYLENOL) 500 MG tablet Take 500 mg by mouth every 4 (four) hours as needed for mild pain or fever.    Yes [provider]  amLODipine (NORVASC) 10 MG tablet Take 1 tablet (10 mg total) by mouth daily. 02/01/19   Blane Ohara, MD  aspirin EC 325 MG tablet Take 1 tablet (325 mg total) by mouth daily. 02/01/19   Blane Ohara, MD  atorvastatin (LIPITOR) 80 MG tablet Take 1 tablet (80 mg total) by mouth at bedtime. 02/01/19   Blane Ohara, MD  benztropine (COGENTIN) 0.5 MG tablet Take 1 tablet (0.5 mg total) by mouth 2 (two) times daily. 02/01/19   Blane Ohara, MD  carvedilol (COREG) 6.25 MG tablet Take 1 tablet (6.25 mg total) by mouth 2 (two) times daily. 02/01/19   Blane Ohara, MD  clopidogrel (PLAVIX) 75 MG tablet Take 1 tablet (75 mg total) by mouth daily. 02/01/19   Blane Ohara, MD  divalproex (DEPAKOTE ER) 500 MG 24 hr tablet Take 2 tablets (1,000 mg total) by mouth daily before supper. At 1600 02/01/19   Blane Ohara, MD  ezetimibe (ZETIA) 10 MG tablet Take 1 tablet (10 mg total) by mouth daily. 02/01/19   Blane Ohara, MD  furosemide (LASIX) 40 MG tablet Take 1 tablet (40 mg total) by mouth 2 (two) times daily. 02/01/19   Blane Ohara, MD  gabapentin (NEURONTIN) 300 MG capsule Take 1 capsule (300 mg total) by mouth 3 (three) times daily. 02/01/19   Blane Ohara, MD  glipiZIDE (GLUCOTROL) 5 MG tablet Take 1 tablet (5 mg total) by mouth daily. 02/01/19   Blane Ohara, MD  insulin aspart (NOVOLOG) 100 UNIT/ML injection Inject 0-20 Units into the skin 3 (three) times daily with meals. Question Answer Comment Correction coverage: Resistant (obese, steroids)  CBG < 70: implement hypoglycemia protocol  CBG 70 - 120: 0 units  CBG 121 - 150:  3 units  CBG 151 - 200: 4 units  CBG 201 - 250: 7 units  CBG 251 - 300: 11 units  CBG 301 - 350: 15 units  CBG 351 - 400: 20 units  CBG > 400 call MD 02/01/19   Blane Ohara, MD  insulin degludec (TRESIBA) 100 UNIT/ML SOPN FlexTouch Pen Inject 0.3 mLs (30 Units total) into the skin at bedtime. 02/01/19   Blane Ohara, MD  insulin lispro (HUMALOG) 100 UNIT/ML injection Inject 0.06 mLs (6 Units total) into the skin 3 (three) times daily with meals. 02/01/19   Blane Ohara, MD  isosorbide mononitrate (IMDUR) 60 MG 24 hr tablet Take 1 tablet (60 mg total) by mouth daily. 02/01/19   Blane Ohara, MD  LORazepam (ATIVAN) 0.5 MG tablet Take 1 tablet (0.5 mg total) by mouth 3  (three) times daily. 02/01/19   Blane Ohara, MD  metFORMIN (GLUCOPHAGE) 1000 MG tablet Take 1 tablet (1,000 mg total) by mouth 2 (two) times daily. 02/01/19   Blane Ohara, MD  metolazone (ZAROXOLYN) 2.5 MG tablet Take 1 tablet (2.5 mg total) by mouth daily. 02/01/19   Blane Ohara, MD  nitroGLYCERIN (NITROSTAT) 0.4 MG SL tablet Place 1 tablet (0.4 mg total) under the tongue every 5 (five) minutes as needed for chest pain. 02/01/19   Blane Ohara, MD  Pimozide 1 MG TABS Take 1 tablet (1 mg total) by mouth 2 (two) times daily. 02/01/19   Blane Ohara, MD  Potassium Chloride ER 20 MEQ TBCR Take 20 mEq by mouth 2 (two) times daily. 02/01/19   Blane Ohara, MD  risperiDONE (RISPERDAL) 1 MG tablet Take 1.5 tablets (1.5 mg total) by mouth 2 (two) times daily. 02/01/19   Blane Ohara, MD  tamsulosin (FLOMAX) 0.4 MG CAPS capsule Take 1 capsule (0.4 mg total) by mouth daily. 02/01/19   Blane Ohara, MD  Vitamin D, Ergocalciferol, (DRISDOL) 1.25 MG (50000 UT) CAPS capsule Take 1 capsule (50,000 Units total) by mouth every Friday. 02/05/19   Blane Ohara, MD    Family History History reviewed. No pertinent family history.  Social History Social History   Tobacco Use  . Smoking status: Never Smoker  . Smokeless tobacco: Never Used  Substance Use Topics  . Alcohol use: Not Currently  . Drug use: Not Currently     Allergies   Keflex [cephalexin]   Review of Systems Review of Systems  Psychiatric/Behavioral: Positive for suicidal ideas.  All other systems reviewed and are negative.    Physical Exam Updated Vital Signs BP (!) 118/58   Pulse 70   Temp 98 F (36.7 C) (Oral)   Resp 18   Ht 6' (1.829 m)   Wt 131.5 kg   SpO2 100%   BMI 39.33 kg/m   Physical Exam Vitals signs and nursing note reviewed.  Constitutional:      General: He is not in acute distress.    Appearance: Normal appearance. He is well-developed.  HENT:     Head: Normocephalic and atraumatic.     Right  Ear: Hearing normal.     Left Ear: Hearing normal.     Nose: Nose normal.  Eyes:     Conjunctiva/sclera: Conjunctivae normal.     Pupils: Pupils are equal, round, and reactive to light.  Neck:     Musculoskeletal: Normal range of motion and neck supple.  Cardiovascular:     Rate and Rhythm: Regular rhythm.     Heart sounds: S1 normal and S2 normal. No murmur. No  friction rub. No gallop.   Pulmonary:     Effort: Pulmonary effort is normal. No respiratory distress.     Breath sounds: Normal breath sounds.  Chest:     Chest wall: No tenderness.  Abdominal:     General: Bowel sounds are normal.     Palpations: Abdomen is soft.     Tenderness: There is no abdominal tenderness. There is no guarding or rebound. Negative signs include Murphy's sign and McBurney's sign.     Hernia: No hernia is present.  Musculoskeletal: Normal range of motion.  Skin:    General: Skin is warm and dry.     Findings: No rash.  Neurological:     Mental Status: He is alert and oriented to person, place, and time.     GCS: GCS eye subscore is 4. GCS verbal subscore is 5. GCS motor subscore is 6.     Cranial Nerves: No cranial nerve deficit.     Sensory: No sensory deficit.     Coordination: Coordination normal.  Psychiatric:        Mood and Affect: Mood is depressed.        Speech: Speech is delayed.        Behavior: Behavior is slowed.        Thought Content: Thought content includes suicidal ideation.      ED Treatments / Results  Labs (all labs ordered are listed, but only abnormal results are displayed) Labs Reviewed  RAPID URINE DRUG SCREEN, HOSP PERFORMED - Abnormal; Notable for the following components:      Result Value   Benzodiazepines POSITIVE (*)    All other components within normal limits  CBC - Abnormal; Notable for the following components:   Hemoglobin 12.2 (*)    HCT 35.7 (*)    All other components within normal limits  COMPREHENSIVE METABOLIC PANEL - Abnormal; Notable for the  following components:   Potassium 2.9 (*)    Chloride 92 (*)    Glucose, Bld 312 (*)    BUN 41 (*)    Creatinine, Ser 1.48 (*)    GFR calc non Af Amer 49 (*)    GFR calc Af Amer 57 (*)    All other components within normal limits  BASIC METABOLIC PANEL - Abnormal; Notable for the following components:   Sodium 134 (*)    Potassium 2.9 (*)    Chloride 90 (*)    Glucose, Bld 265 (*)    BUN 33 (*)    Creatinine, Ser 1.30 (*)    GFR calc non Af Amer 57 (*)    Anion gap 16 (*)    All other components within normal limits  POTASSIUM - Abnormal; Notable for the following components:   Potassium 3.3 (*)    All other components within normal limits  CBC WITH DIFFERENTIAL/PLATELET - Abnormal; Notable for the following components:   RBC 3.98 (*)    Hemoglobin 11.7 (*)    HCT 33.7 (*)    All other components within normal limits  COMPREHENSIVE METABOLIC PANEL - Abnormal; Notable for the following components:   Sodium 134 (*)    Potassium 2.9 (*)    Chloride 90 (*)    Glucose, Bld 139 (*)    BUN 44 (*)    Creatinine, Ser 1.39 (*)    GFR calc non Af Amer 53 (*)    Anion gap 17 (*)    All other components within normal limits  URINALYSIS, ROUTINE W REFLEX  MICROSCOPIC - Abnormal; Notable for the following components:   Hgb urine dipstick SMALL (*)    All other components within normal limits  BASIC METABOLIC PANEL - Abnormal; Notable for the following components:   Sodium 134 (*)    Potassium 3.1 (*)    Chloride 91 (*)    Glucose, Bld 209 (*)    BUN 34 (*)    Creatinine, Ser 1.35 (*)    GFR calc non Af Amer 55 (*)    All other components within normal limits  CBG MONITORING, ED - Abnormal; Notable for the following components:   Glucose-Capillary 266 (*)    All other components within normal limits  CBG MONITORING, ED - Abnormal; Notable for the following components:   Glucose-Capillary 107 (*)    All other components within normal limits  CBG MONITORING, ED - Abnormal; Notable  for the following components:   Glucose-Capillary 239 (*)    All other components within normal limits  CBG MONITORING, ED - Abnormal; Notable for the following components:   Glucose-Capillary 133 (*)    All other components within normal limits  CBG MONITORING, ED - Abnormal; Notable for the following components:   Glucose-Capillary 240 (*)    All other components within normal limits  CBG MONITORING, ED - Abnormal; Notable for the following components:   Glucose-Capillary 192 (*)    All other components within normal limits  CBG MONITORING, ED - Abnormal; Notable for the following components:   Glucose-Capillary 229 (*)    All other components within normal limits  CBG MONITORING, ED - Abnormal; Notable for the following components:   Glucose-Capillary 238 (*)    All other components within normal limits  CBG MONITORING, ED - Abnormal; Notable for the following components:   Glucose-Capillary 264 (*)    All other components within normal limits  CBG MONITORING, ED - Abnormal; Notable for the following components:   Glucose-Capillary 166 (*)    All other components within normal limits  CBG MONITORING, ED - Abnormal; Notable for the following components:   Glucose-Capillary 218 (*)    All other components within normal limits  CBG MONITORING, ED - Abnormal; Notable for the following components:   Glucose-Capillary 192 (*)    All other components within normal limits  CBG MONITORING, ED - Abnormal; Notable for the following components:   Glucose-Capillary 289 (*)    All other components within normal limits  CBG MONITORING, ED - Abnormal; Notable for the following components:   Glucose-Capillary 126 (*)    All other components within normal limits  CBG MONITORING, ED - Abnormal; Notable for the following components:   Glucose-Capillary 218 (*)    All other components within normal limits  CBG MONITORING, ED - Abnormal; Notable for the following components:   Glucose-Capillary  227 (*)    All other components within normal limits  CBG MONITORING, ED - Abnormal; Notable for the following components:   Glucose-Capillary 137 (*)    All other components within normal limits  CBG MONITORING, ED - Abnormal; Notable for the following components:   Glucose-Capillary 157 (*)    All other components within normal limits  CBG MONITORING, ED - Abnormal; Notable for the following components:   Glucose-Capillary 287 (*)    All other components within normal limits  CBG MONITORING, ED - Abnormal; Notable for the following components:   Glucose-Capillary 204 (*)    All other components within normal limits  CBG MONITORING, ED - Abnormal; Notable  for the following components:   Glucose-Capillary 120 (*)    All other components within normal limits  CBG MONITORING, ED - Abnormal; Notable for the following components:   Glucose-Capillary 235 (*)    All other components within normal limits  CBG MONITORING, ED - Abnormal; Notable for the following components:   Glucose-Capillary 276 (*)    All other components within normal limits  CBG MONITORING, ED - Abnormal; Notable for the following components:   Glucose-Capillary 184 (*)    All other components within normal limits  CBG MONITORING, ED - Abnormal; Notable for the following components:   Glucose-Capillary 155 (*)    All other components within normal limits  CBG MONITORING, ED - Abnormal; Notable for the following components:   Glucose-Capillary 155 (*)    All other components within normal limits  CBG MONITORING, ED - Abnormal; Notable for the following components:   Glucose-Capillary 122 (*)    All other components within normal limits  CBG MONITORING, ED - Abnormal; Notable for the following components:   Glucose-Capillary 191 (*)    All other components within normal limits  CBG MONITORING, ED - Abnormal; Notable for the following components:   Glucose-Capillary 148 (*)    All other components within normal  limits  CBG MONITORING, ED - Abnormal; Notable for the following components:   Glucose-Capillary 105 (*)    All other components within normal limits  CBG MONITORING, ED - Abnormal; Notable for the following components:   Glucose-Capillary 225 (*)    All other components within normal limits  CBG MONITORING, ED - Abnormal; Notable for the following components:   Glucose-Capillary 103 (*)    All other components within normal limits  CBG MONITORING, ED - Abnormal; Notable for the following components:   Glucose-Capillary 119 (*)    All other components within normal limits  CBG MONITORING, ED - Abnormal; Notable for the following components:   Glucose-Capillary 144 (*)    All other components within normal limits  CBG MONITORING, ED - Abnormal; Notable for the following components:   Glucose-Capillary 115 (*)    All other components within normal limits  CBG MONITORING, ED - Abnormal; Notable for the following components:   Glucose-Capillary 131 (*)    All other components within normal limits  CBG MONITORING, ED - Abnormal; Notable for the following components:   Glucose-Capillary 119 (*)    All other components within normal limits  CBG MONITORING, ED - Abnormal; Notable for the following components:   Glucose-Capillary 158 (*)    All other components within normal limits  CBG MONITORING, ED - Abnormal; Notable for the following components:   Glucose-Capillary 182 (*)    All other components within normal limits  CBG MONITORING, ED - Abnormal; Notable for the following components:   Glucose-Capillary 127 (*)    All other components within normal limits  CBG MONITORING, ED - Abnormal; Notable for the following components:   Glucose-Capillary 176 (*)    All other components within normal limits  CBG MONITORING, ED - Abnormal; Notable for the following components:   Glucose-Capillary 251 (*)    All other components within normal limits  CBG MONITORING, ED - Abnormal; Notable for  the following components:   Glucose-Capillary 124 (*)    All other components within normal limits  CBG MONITORING, ED - Abnormal; Notable for the following components:   Glucose-Capillary 182 (*)    All other components within normal limits  CBG MONITORING, ED -  Abnormal; Notable for the following components:   Glucose-Capillary 201 (*)    All other components within normal limits  CBG MONITORING, ED - Abnormal; Notable for the following components:   Glucose-Capillary 210 (*)    All other components within normal limits  CBG MONITORING, ED - Abnormal; Notable for the following components:   Glucose-Capillary 101 (*)    All other components within normal limits  CBG MONITORING, ED - Abnormal; Notable for the following components:   Glucose-Capillary 175 (*)    All other components within normal limits  CBG MONITORING, ED - Abnormal; Notable for the following components:   Glucose-Capillary 171 (*)    All other components within normal limits  CBG MONITORING, ED - Abnormal; Notable for the following components:   Glucose-Capillary 176 (*)    All other components within normal limits  CBG MONITORING, ED - Abnormal; Notable for the following components:   Glucose-Capillary 136 (*)    All other components within normal limits  ETHANOL  TROPONIN I  MAGNESIUM  CBG MONITORING, ED  CBG MONITORING, ED  CBG MONITORING, ED  CBG MONITORING, ED  CBG MONITORING, ED  CBG MONITORING, ED    EKG EKG Interpretation  Date/Time:  Saturday January 30 2019 04:09:40 EDT Ventricular Rate:  65 PR Interval:    QRS Duration: 182 QT Interval:  546 QTC Calculation: 568 R Axis:   -93 Text Interpretation:  Junctional rhythm RBBB and LAFB Confirmed by Kennis Carina [phone]) on 01/30/2019 6:50:49 AM   Radiology No results found.  Procedures Procedures (including critical care time)  Medications Ordered in ED Medications  LORazepam (ATIVAN) tablet 1 mg (0.5 mg Oral Given 01/20/19 1534)  potassium  chloride SA (K-DUR,KLOR-CON) CR tablet 40 mEq (40 mEq Oral Given 01/25/19 1008)  magnesium oxide (MAG-OX) tablet 400 mg (400 mg Oral Given 01/25/19 1016)  tuberculin injection 5 Units (5 Units Intradermal Given 01/27/19 0906)  acetaminophen (TYLENOL) tablet 650 mg (650 mg Oral Given 01/30/19 0259)  potassium chloride 10 mEq in 100 mL IVPB ( Intravenous Stopped 01/30/19 0815)  magnesium sulfate IVPB 2 g 50 mL (0 g Intravenous Stopped 01/30/19 0622)  Influenza vac split quadrivalent PF (FLUZONE HIGH-DOSE) injection 0.5 mL (0.5 mLs Intramuscular Given 02/01/19 1133)     Initial Impression / Assessment and Plan / ED Course  I have reviewed the triage vital signs and the nursing notes.  Pertinent labs & imaging results that were available during my care of the patient were reviewed by me and considered in my medical decision making (see chart for details).  Clinical Course as of Feb 06 419  Wynelle Link Jan 24, 2019  1810 K still low - will give more K   [BM]  Sat Jan 30, 2019  6045 hypoK   [MB]    Clinical Course User Index [BM] Eber Hong, MD [MB] Sabas Sous, MD       Patient return to the emergency department after making comments about harming himself and others at the group home once again.  Patient medically clear for psychiatric evaluation.  Final Clinical Impressions(s) / ED Diagnoses   Final diagnoses:  Suicidal ideation    ED Discharge Orders         Ordered    Vitamin D, Ergocalciferol, (DRISDOL) 1.25 MG (50000 UT) CAPS capsule  Every Fri     02/01/19 1041    amLODipine (NORVASC) 10 MG tablet  Daily     02/01/19 1041    aspirin EC 325 MG  tablet  Daily     02/01/19 1041    atorvastatin (LIPITOR) 80 MG tablet  Daily at bedtime     02/01/19 1041    benztropine (COGENTIN) 0.5 MG tablet  2 times daily     02/01/19 1041    carvedilol (COREG) 6.25 MG tablet  2 times daily     02/01/19 1041    clopidogrel (PLAVIX) 75 MG tablet  Daily     02/01/19 1041    divalproex  (DEPAKOTE ER) 500 MG 24 hr tablet  Daily before supper     02/01/19 1041    ezetimibe (ZETIA) 10 MG tablet  Daily     02/01/19 1041    furosemide (LASIX) 40 MG tablet  2 times daily     02/01/19 1041    gabapentin (NEURONTIN) 300 MG capsule  3 times daily     02/01/19 1041    glipiZIDE (GLUCOTROL) 5 MG tablet  Daily     02/01/19 1041    insulin aspart (NOVOLOG) 100 UNIT/ML injection  3 times daily with meals     02/01/19 1041    insulin degludec (TRESIBA) 100 UNIT/ML SOPN FlexTouch Pen  Daily at bedtime     02/01/19 1041    insulin lispro (HUMALOG) 100 UNIT/ML injection  3 times daily with meals     02/01/19 1041    isosorbide mononitrate (IMDUR) 60 MG 24 hr tablet  Daily     02/01/19 1041    LORazepam (ATIVAN) 0.5 MG tablet  3 times daily     02/01/19 1041    metFORMIN (GLUCOPHAGE) 1000 MG tablet  2 times daily     02/01/19 1041    metolazone (ZAROXOLYN) 2.5 MG tablet  Daily     02/01/19 1041    nitroGLYCERIN (NITROSTAT) 0.4 MG SL tablet  Every 5 min PRN     02/01/19 1041    Pimozide 1 MG TABS  2 times daily     02/01/19 1041    Potassium Chloride ER 20 MEQ TBCR  2 times daily     02/01/19 1041    risperiDONE (RISPERDAL) 1 MG tablet  2 times daily     02/01/19 1041    tamsulosin (FLOMAX) 0.4 MG CAPS capsule  Daily     02/01/19 1041           Rosamaria Donn, Canary Brim, MD 01/19/19 0001    Gilda Crease, MD 01/25/19 1610    Gilda Crease, MD 02/06/19 [phone]

## 2019-01-18 NOTE — Clinical Social Work Note (Addendum)
LCSW spoke with patient about his refusal to return to facility. Patient said he does not like one of the staff members at the facility. Patient stated that the facility also serves the same thing for lunch and dinner and he does not like this. Patient stated that he was going to refuse his medications from the staff that he did not like and scratch himself again.  LCSW utilized cognitive behavioral approaches with patient in an effort to help him realize that these behaviors were only hurting him. He verbalized understanding.   LCSW spoke with Stanton Kidney and advised that patient was psych and medically cleared for discharge and could be picked up. Stanton Kidney advised that the facility administrator had been notified  LCSW spoke with guardian, Mr. Delford Field, and discussed patient's medical and psych clearance. Mr. Delford Field stated that patient has a history of wanting to leave every facility. He indicated that patient wants to be in a SNF, however he is not SNF appropriate. He was agreeable for patient to return to his current facility [facility]).   LCSW signing off.        Valorie Mcgrory, Juleen China, LCSW

## 2019-01-18 NOTE — Progress Notes (Signed)
Patient was seen and psychiatrically cleared by Reola Calkins, NP.  Patient is recommended for d/c.  CSW called and left a voice mail for Magdalene River [phone]) notifying him of discharge recommendation.  CSW called and spoke to [facility] Administrator, Jimmye Norman [phone]) who stated that she needs to arrange for staff to pick-up.  CSW gave her number to [facility], and asked her to call directly when she determines time of pick-up.  CSW called [facility] RN, Thayer Ohm, and notified of disposition.  Timmothy Euler. Kaylyn Lim, MSW, LCSWA Disposition Clinical Social Work [phone] (cell) [phone] (office)

## 2019-01-18 NOTE — ED Notes (Signed)
Recommending discharge. [facility] has talked with Guardian. Pt will now go to his group home. Group home administrator will call the ED.

## 2019-01-18 NOTE — ED Notes (Signed)
Pt sitting up in chair by request. Non slip socks on. [employer] at bedside

## 2019-01-18 NOTE — ED Notes (Signed)
Patient assisted into paper scrubs and urine specimen obtained.

## 2019-01-19 ENCOUNTER — Encounter (HOSPITAL_COMMUNITY): Payer: Self-pay | Admitting: Registered Nurse

## 2019-01-19 DIAGNOSIS — F333 Major depressive disorder, recurrent, severe with psychotic symptoms: Secondary | ICD-10-CM | POA: Diagnosis not present

## 2019-01-19 LAB — COMPREHENSIVE METABOLIC PANEL
ALT: 15 U/L (ref 0–44)
AST: 16 U/L (ref 15–41)
Albumin: 3.7 g/dL (ref 3.5–5.0)
Alkaline Phosphatase: 53 U/L (ref 38–126)
Anion gap: 15 (ref 5–15)
BILIRUBIN TOTAL: 0.5 mg/dL (ref 0.3–1.2)
BUN: 41 mg/dL — AB (ref 8–23)
CO2: 28 mmol/L (ref 22–32)
Calcium: 9.2 mg/dL (ref 8.9–10.3)
Chloride: 92 mmol/L — ABNORMAL LOW (ref 98–111)
Creatinine, Ser: 1.48 mg/dL — ABNORMAL HIGH (ref 0.61–1.24)
GFR calc Af Amer: 57 mL/min — ABNORMAL LOW (ref >60)
GFR, EST NON AFRICAN AMERICAN: 49 mL/min — AB (ref >60)
Glucose, Bld: 312 mg/dL — ABNORMAL HIGH (ref 70–99)
Potassium: 2.9 mmol/L — ABNORMAL LOW (ref 3.5–5.1)
Sodium: 135 mmol/L (ref 135–145)
Total Protein: 6.7 g/dL (ref 6.5–8.1)

## 2019-01-19 LAB — CBC
HEMATOCRIT: 35.7 % — AB (ref 39.0–52.0)
Hemoglobin: 12.2 g/dL — ABNORMAL LOW (ref 13.0–17.0)
MCH: 28.7 pg (ref 26.0–34.0)
MCHC: 34.2 g/dL (ref 30.0–36.0)
MCV: 84 fL (ref 80.0–100.0)
Platelets: 196 10*3/uL (ref 150–400)
RBC: 4.25 MIL/uL (ref 4.22–5.81)
RDW: 14.5 % (ref 11.5–15.5)
WBC: 8.3 10*3/uL (ref 4.0–10.5)
nRBC: 0 % (ref 0.0–0.2)

## 2019-01-19 LAB — ETHANOL: Alcohol, Ethyl (B): <10 mg/dL (ref <10)

## 2019-01-19 LAB — CBG MONITORING, ED
Glucose-Capillary: 266 mg/dL — ABNORMAL HIGH (ref 70–99)
Glucose-Capillary: 107 mg/dL — ABNORMAL HIGH (ref 70–99)
Glucose-Capillary: 239 mg/dL — ABNORMAL HIGH (ref 70–99)

## 2019-01-19 MED ORDER — INSULIN ASPART 100 UNIT/ML ~~LOC~~ SOLN
0.0000 [IU] | Freq: Every day | SUBCUTANEOUS | Status: DC
  Administered 2019-01-19: 3 [IU] via SUBCUTANEOUS
  Administered 2019-01-19 – 2019-01-23 (×5): 2 [IU] via SUBCUTANEOUS
  Administered 2019-01-25: 4 [IU] via SUBCUTANEOUS
  Administered 2019-01-26: 2 [IU] via SUBCUTANEOUS
  Filled 2019-01-19 (×8): qty 1

## 2019-01-19 MED ORDER — INSULIN ASPART 100 UNIT/ML ~~LOC~~ SOLN
0.0000 [IU] | Freq: Three times a day (TID) | SUBCUTANEOUS | Status: DC
  Administered 2019-01-20: 3 [IU] via SUBCUTANEOUS
  Administered 2019-01-20: 4 [IU] via SUBCUTANEOUS
  Administered 2019-01-20 – 2019-01-21 (×2): 7 [IU] via SUBCUTANEOUS
  Administered 2019-01-21: 11 [IU] via SUBCUTANEOUS
  Administered 2019-01-21: 4 [IU] via SUBCUTANEOUS
  Administered 2019-01-22: 3 [IU] via SUBCUTANEOUS
  Administered 2019-01-22 – 2019-01-23 (×2): 11 [IU] via SUBCUTANEOUS
  Administered 2019-01-23: 3 [IU] via SUBCUTANEOUS
  Administered 2019-01-23: 4 [IU] via SUBCUTANEOUS
  Administered 2019-01-24: 17 [IU] via SUBCUTANEOUS
  Administered 2019-01-24: 7 [IU] via SUBCUTANEOUS
  Administered 2019-01-24: 3 [IU] via SUBCUTANEOUS
  Administered 2019-01-25 (×2): 4 [IU] via SUBCUTANEOUS
  Administered 2019-01-25 – 2019-01-28 (×3): 3 [IU] via SUBCUTANEOUS
  Administered 2019-01-29 – 2019-01-30 (×2): 4 [IU] via SUBCUTANEOUS
  Administered 2019-01-30: 7 [IU] via SUBCUTANEOUS
  Administered 2019-01-31: 10 [IU] via SUBCUTANEOUS
  Administered 2019-01-31: 4 [IU] via SUBCUTANEOUS
  Administered 2019-02-01: 3 [IU] via SUBCUTANEOUS
  Filled 2019-01-19 (×25): qty 1

## 2019-01-19 MED ORDER — INSULIN ASPART 100 UNIT/ML ~~LOC~~ SOLN
6.0000 [IU] | Freq: Three times a day (TID) | SUBCUTANEOUS | Status: DC
  Administered 2019-01-20 – 2019-01-25 (×13): 6 [IU] via SUBCUTANEOUS
  Filled 2019-01-19 (×12): qty 1

## 2019-01-19 MED ORDER — INSULIN GLARGINE 100 UNIT/ML ~~LOC~~ SOLN
40.0000 [IU] | Freq: Every day | SUBCUTANEOUS | Status: DC
  Administered 2019-01-19 – 2019-01-26 (×9): 40 [IU] via SUBCUTANEOUS
  Filled 2019-01-19 (×10): qty 0.4

## 2019-01-19 NOTE — BH Assessment (Signed)
Clinician contacted pt's group home - [facility] - in an attempt to get collateral from pt's caregivers. Clinician was unable to make contact so left a message requesting they return clinician's phone call. Clinician then called pt's legal guardian, Magdalene River, at [phone], and left a message requesting he return this clinician's phone call. Clinician then called the emergency phone number on pt's legal guardian's message and spoke to the on-call at [phone]; clinician provided pt's name and the name of the hospital pt was re-admitted to and pt's legal guardian's name to ensure it was documented that pt was re-admitted into the hospital.  [facility], group home: [phone] Magdalene River, legal guardian: [phone] Legal guardian on-call: [phone]

## 2019-01-19 NOTE — Clinical Social Work Note (Signed)
LCSW spoke with patient's guardian, Mr. Delford Field, and advised that patient was being recommended for inpatient psychiatric due to SI/HI related to the facility med tec.   LCSW discussed that due to his threats of harm to facility staff, patient will likely need another placement once patient is psychiatrically cleared.    LCSW signing off.     Onell Mcmath, Juleen China, LCSW

## 2019-01-19 NOTE — Consult Note (Signed)
Tele Assessment   Jason [name], 68 y.o., male patient presented to [facility] after threatening to kill himself and med tech.  Patient was discharged earlier the same day.  Patient doesn't like living in the group home that he is currently living in.  Patient seen via telepsych by this provider; chart reviewed and consulted with Dr. Lucianne Muss on 01/19/19.  On evaluation Jason [name] reports he came back to the hospital because he was suicidal.  Patient asked what was making him suicidal and he responded " I was upset with the med tech.  I do not like her at all.  I asked her to help me and she wouldn't give me no help.  I am tired of her."  Patient asked what type of help that he asked her for and he stated " I told her I needed to go to the hospital and she wouldn't help me get back to the hospital.  Patient informed that he had just left the hospital and was sent back to the group home and he stated " I don't like that place I don't like her; and I don't like the manager either.  I'm going to get rid of her and myself to."  Patient asked what he meant by get rid of and he stated " she is just a B- I- T- C- H I'll choker today if I go back there."  Patient states he wants somewhere else to live he doesn't care where he goes " I will go to a mental hospital if I have to.  I just don't want to go back there."  Patient states he is willing to go to another group home " but I don't want to go anywhere in [place] or [place]."  Patient states that his guardian is supposed to be trying to find him another home " but he is lazy and sorry."  Patient states that he is unable to use the phone at the group home because he cannot make long distance phone calls and he is unable to call anywhere to look for a different place to live.  "  I don't want to go back there plain and simple if I go back I am going to hurt myself and her."  At this time patient denies psychosis.  States yesterday he was hearing voices telling  him to hurt her (referring to med tech).  Patient also.denies paranoia.  Patient continues to endorse that he is going to hurt himself and the med tech if he is sent back to the group home.  Patient states if he is placed in a different home he would no longer be suicidal or homicidal.  During evaluation Jason [name] is lying in bed; he is alert/oriented x 4; calm/cooperative; and mood congruent with affect.  Patient is speaking in a clear tone at moderate volume, and normal pace; with good eye contact.  His thought process is coherent and relevant; There is no indication that he is currently responding to internal/external stimuli or experiencing delusional thought content.  Patient denies psychosis, and paranoia; but continues to endorse homicidal ideation towards Med Tech and suicidal ideation.  Stating he would choke to may take if sent back to the home but does not have a plan for his suicide.  Reports this is because he does not like living where he is currently living and does not like the group home staff mainly the med tech or the manager of the home.  Patient  has remained calm throughout assessment and has answered questions appropriately.   Recommendations:  Inpatient psychiatric treatment related to it being unsafe for the staff with patient threatening to choke med tech.    Disposition: Recommend psychiatric Inpatient admission when medically cleared.   Spoke with Dr. Clarene Duke ; informed of above recommendation and disposition   Assunta Found, NP

## 2019-01-19 NOTE — ED Notes (Signed)
Pt hit underside of bedside table causing it to fall in the floor. Pt advised not to do that. Stool, chair, and bedside table removed from pts room.

## 2019-01-19 NOTE — ED Notes (Signed)
TTS at bedside. 

## 2019-01-19 NOTE — BH Assessment (Signed)
Tele Assessment Note   Patient Name: Jason PoseyRicky A [name] MRN: [id number] Referring Physician: Dr. Jaci Carrelhristopher Pollina, MD [facility] Location of Provider: Behavioral Health TTS Department  Jason [name] is a 68 y.o. male who was [facility] this evening after being discharged earlier today due to having ongoing SI and expressing HI towards the med tech who works at the group home he resides at. Pt states he does not want to go back to his group home and that he "would rather go stay in a mental hospital" than go back there. Pt was unable to give a reason as to why he does not like med tech at his group home, but stated that he would cut his arm and that he would kill the med tech if he was made to go back there. Pt shared he hears voices telling him to "go for it;" when clinician inquired as to what that meant, pt stated it meant for him to "go ahead and kill" the med tech. He acknowledges that he scratches himself when he is upset but denies any other NSSIB. Pt denied VH, SA, access to weapons (including guns), or involvement in the legal system.  Pt shares he is unhappy in his group home because he has no one to talk to; he states he would like to be in town so he has places to go and things to do. Pt states he has asked the owner of the group home to please move to another group home but he states she told him she didn't currently have any openings. Pt states he also told the owner of the group home that he would be good for a month and not cause her any trouble if she would move him. Clinician pointed out that the owner of the group home had no reason to move him due to him causing her so much trouble with threatening to kill one of her employees; clinician inquired if maybe pt would be moved soon if he were to work towards a resolution and getting along. Pt again stated he didn't want to go back.  Clinician attempted to make contact with [facility] at the phone number provided but there was no answer; a message was left. Clinician also called pt's legal guardian and left a message; clinician then talked the legal guardian emergency on-call to ensure it was documented that pt has returned to the hospital. Thus, no collateral was obtained for pt.  Pt is oriented x4. His recent and remote memory is intact, though he didn't provide much information that gave clinician much to gauge this off of. Pt was primarily distraught throughout the assessment, though he expressed one moment of happiness when he shared with clinician that he had his model car with him; he stated it was a model of a car that won one of the [place] races. He stated it was about one foot long and that he tries to take it with him everywhere he goes. Pt was smiling brightly while he discussed this car, and as soon as this conversation was over pt returned back to being unhappy. Pt was, overall, cooperative throughout the assessment, though he could not be convinced to consider why killing the med tech was a bad idea. Pt's insight, judgement, and impulse control is impaired at this time.   Diagnosis: F33.3, Major depressive disorder, Recurrent episode, With psychotic features   Past Medical History:  Past Medical  History:  Diagnosis Date  . Diabetes mellitus without complication (HCC)   . GERD (gastroesophageal reflux disease)   . Hypertension   . Paranoid schizophrenia (HCC)   . Stroke (HCC)   . Tourette disorder     Past Surgical History:  Procedure Laterality Date  . CARDIAC SURGERY    . CORONARY ARTERY BYPASS GRAFT    . HAND SURGERY    . TONSILLECTOMY      Family History: History reviewed. No pertinent family history.  Social History:  reports that he has never smoked. He has never used smokeless tobacco. He reports previous alcohol use. He reports previous drug use.  Additional Social History:     CIWA: CIWA-Ar BP: 122/82 Pulse Rate: 68 COWS:     Allergies:  Allergies  Allergen Reactions  . Keflex [Cephalexin] Rash    Home Medications: (Not in a hospital admission)   OB/GYN Status:  No LMP for male patient.  General Assessment Data Location of Assessment: [facility] TTS Assessment: In system Is this a Tele or Face-to-Face Assessment?: Tele Assessment Is this an Initial Assessment or a Re-assessment for this encounter?: Initial Assessment Patient Accompanied by:: N/A Language Other than English: No Living Arrangements: In Assisted Living/Nursing Home (Comment: Name of Nursing Home([facility]) What gender do you identify as?: Male Marital status: Divorced Jordan name: Wiehe Pregnancy Status: No Living Arrangements: Group Home Can pt return to current living arrangement?: Yes(Though pt does not want to return to the group home) Admission Status: Voluntary Is patient capable of signing voluntary admission?: Yes Referral Source: Self/Family/Friend Insurance type: Medicare     Crisis Care Plan Living Arrangements: Group Home Legal Guardian: Other:(Chavis Cash, legal guardian) Name of Psychiatrist: Pt unsure Name of Therapist: None  Education Status Is patient currently in school?: No Is the patient employed, unemployed or receiving disability?: Receiving disability income  Risk to self with the past 6 months Suicidal Ideation: Yes-Currently Present Has patient been a risk to self within the past 6 months prior to admission? : Yes Suicidal Intent: Yes-Currently Present Has patient had any suicidal intent within the past 6 months prior to admission? : Yes Is patient at risk for suicide?: Yes Suicidal Plan?: Yes-Currently Present Has patient had any suicidal plan within the past 6 months prior to admission? : Yes Specify Current Suicidal Plan: Pt states he is going to cut his forearm Access to Means: (Unsure) Specify Access to Suicidal Means: Unsure if sharps are kept locked up at group home What has been your  use of drugs/alcohol within the last 12 months?: Pt denies Previous Attempts/Gestures: Yes How many times?: 1 Other Self Harm Risks: None noted Triggers for Past Attempts: Unpredictable Intentional Self Injurious Behavior: Damaging(Scratching) Comment - Self Injurious Behavior: Pt scratches himself when upset Family Suicide History: No Recent stressful life event(s): Conflict (Comment), Turmoil (Comment)(Pt wants to [facility] out of [facility]) Persecutory voices/beliefs?: No Depression: Yes Depression Symptoms: Fatigue, Feeling worthless/self pity, Feeling angry/irritable Substance abuse history and/or treatment for substance abuse?: No Suicide prevention information given to non-admitted patients: Not applicable  Risk to Others within the past 6 months Homicidal Ideation: Yes-Currently Present Does patient have any lifetime risk of violence toward others beyond the six months prior to admission? : No Thoughts of Harm to Others: Yes-Currently Present Comment - Thoughts of Harm to Others: Pt wants to kill the woman who lives at [facility] (med tech) Current Homicidal Intent: Yes-Currently Present Current Homicidal Plan: No Access to Homicidal Means: No Identified  Victim: Med Tech/woman who [facility] History of harm to others?: No Assessment of Violence: On admission Violent Behavior Description: Not described Does patient have access to weapons?: No Criminal Charges Pending?: No Does patient have a court date: No Is patient on probation?: No  Psychosis Hallucinations: Auditory(Hears a crowd telling him to "go for it" (kill the med tech)) Delusions: None noted  Mental Status Report Appearance/Hygiene: In scrubs Eye Contact: Good Motor Activity: Freedom of movement(Pt is lying in his hospital bed) Speech: Soft Level of Consciousness: Alert Mood: Worthless, low self-esteem Affect: Depressed, Sullen Anxiety Level: Minimal Thought Processes: Thought Blocking Judgement:  Impaired Orientation: Person, Place, Time, Situation Obsessive Compulsive Thoughts/Behaviors: Moderate  Cognitive Functioning Concentration: Decreased Memory: Recent Intact, Remote Intact Is patient IDD: No Insight: Fair Impulse Control: Poor Appetite: Good Have you had any weight changes? : No Change Sleep: Decreased Total Hours of Sleep: 6 Vegetative Symptoms: Staying in bed, Decreased grooming  ADLScreening [facility]) Patient's cognitive ability adequate to safely complete daily activities?: Yes  Prior Inpatient Therapy Prior Inpatient Therapy: Yes Prior Therapy Dates: (Multiple) Prior Therapy Facilty/Provider(s): [facility] [place] Reason for Treatment: Depression, SI  Prior Outpatient Therapy Prior Outpatient Therapy: Yes Prior Therapy Dates: Multiple Prior Therapy Facilty/Provider(s): [place] Reason for Treatment: Medication Management Does patient have an ACCT team?: No Does patient have Intensive In-House Services?  : No Does patient have [facility] services? : No Does patient have P4CC services?: No  ADL Screening (condition at time of admission) Patient's cognitive ability adequate to safely complete daily activities?: Yes Is the patient deaf or have difficulty hearing?: No Does the patient have difficulty seeing, even when wearing glasses/contacts?: No Does the patient have difficulty concentrating, remembering, or making decisions?: Yes Does the patient have difficulty dressing or bathing?: No Does the patient have difficulty walking or climbing stairs?: No Weakness of Legs: None Weakness of Arms/Hands: None     Therapy Consults (therapy consults require a physician order) PT Evaluation Needed: No OT Evalulation Needed: No SLP Evaluation Needed: No Abuse/Neglect Assessment (Assessment to be complete while patient is alone) Abuse/Neglect Assessment Can Be Completed: Unable to assess, patient is non-responsive or altered mental  status Values / Beliefs Cultural Requests During Hospitalization: None Spiritual Requests During Hospitalization: None Consults Spiritual Care Consult Needed: No Social Work Consult Needed: No Merchant navy officer (For Healthcare) Does Patient Have a Medical Advance Directive?: No Would patient like information on creating a medical advance directive?: No - Patient declined       Disposition: Nira Conn, NP, reviewed pt's chart and information and determined pt should be observed overnight for safety and stability due to his discharge from the hospital earlier today and due to threats to harm himself and the med tech that stays in the group home he lives in. Pt will be re-assessed tomorrow. This information was provided to pt's nurse, Alto Denver, at [phone].  Disposition Initial Assessment Completed for this Encounter: Yes Patient referred to: Other (Comment)(Pt will be observed overnight for safety and stability)  This service was provided via telemedicine using a 2-way, interactive audio and video technology.  Names of all persons participating in this telemedicine service and their role in this encounter. Name: Devyne Wadding Role: Patient  Name: Duard Brady Role: Clinician    Ralph Dowdy 01/19/2019 3:23 AM

## 2019-01-19 NOTE — Progress Notes (Signed)
Pt. meets criteria for inpatient treatment per Assunta Found, NP.  No appropriate beds available at [facility]. Referred out to the following hospitals: [facility]  [facility]  [facility]  [facility]  [facility]  [facility]  [facility]  [facility]  [facility]  [facility]  [facility]  [facility]  [facility]  [facility]     Disposition CSW will continue to follow for placement.  Timmothy Euler. Kaylyn Lim, MSW, LCSWA Disposition Clinical Social Work [phone] (cell) [phone] (office)

## 2019-01-20 DIAGNOSIS — F333 Major depressive disorder, recurrent, severe with psychotic symptoms: Secondary | ICD-10-CM | POA: Diagnosis not present

## 2019-01-20 LAB — CBG MONITORING, ED
GLUCOSE-CAPILLARY: 192 mg/dL — AB (ref 70–99)
Glucose-Capillary: 133 mg/dL — ABNORMAL HIGH (ref 70–99)
Glucose-Capillary: 240 mg/dL — ABNORMAL HIGH (ref 70–99)
Glucose-Capillary: 229 mg/dL — ABNORMAL HIGH (ref 70–99)

## 2019-01-20 NOTE — ED Notes (Signed)
Message left for social work to call [place] at [facility] [phone]

## 2019-01-20 NOTE — ED Notes (Signed)
Pt needs a shower.  Offered shower to pt and pt strongly refused!

## 2019-01-20 NOTE — ED Notes (Signed)
Reassessed Patient continues to endorse that he is going to hurt himself and the med tech if he is sent back to the group home, patient stated "I don't know". Patient stated "I am doing well with no complaints, they are taking care of me here". Patient continues to share his discontent with his group home, stating he needs another placement as they do not treat him right and he would like be closer to his sons so they can visit him more. Patient states if he is placed in a different home he would no longer be suicidal or homicidal.

## 2019-01-20 NOTE — BH Assessment (Signed)
01/20/19 Pt remains under review at the following hospitals: (Re-faxed all updated clinicals to each hospital listed below).   [facility]  [facility]  [facility]  [facility]  [facility]  [facility]  [facility]  [facility]  [facility]  [facility]

## 2019-01-21 ENCOUNTER — Encounter (HOSPITAL_COMMUNITY): Payer: Self-pay | Admitting: Registered Nurse

## 2019-01-21 DIAGNOSIS — F333 Major depressive disorder, recurrent, severe with psychotic symptoms: Secondary | ICD-10-CM | POA: Diagnosis not present

## 2019-01-21 LAB — CBG MONITORING, ED
GLUCOSE-CAPILLARY: 238 mg/dL — AB (ref 70–99)
Glucose-Capillary: 264 mg/dL — ABNORMAL HIGH (ref 70–99)
Glucose-Capillary: 166 mg/dL — ABNORMAL HIGH (ref 70–99)
Glucose-Capillary: 218 mg/dL — ABNORMAL HIGH (ref 70–99)

## 2019-01-21 NOTE — ED Notes (Signed)
Pt given sprite, peanut butter, crackers and juice; pt encouraged to go to bed

## 2019-01-21 NOTE — ED Notes (Signed)
Pt ambulatory to bathroom. Telesitter is NOT alarming when patient gets up to use restroom. Has happened 3x today

## 2019-01-21 NOTE — ED Notes (Signed)
Pt ambulatory to the restroom.  

## 2019-01-21 NOTE — Progress Notes (Signed)
Inpatient Diabetes Program Recommendations  AACE/ADA: New Consensus Statement on Inpatient Glycemic Control (2015)  Target Ranges:  Prepandial:   less than 140 mg/dL      Peak postprandial:   less than 180 mg/dL (1-2 hours)      Critically ill patients:  140 - 180 mg/dL   Lab Results  Component Value Date   GLUCAP 264 (H) 01/21/2019    Review of Glycemic Control  Inpatient Diabetes Program Recommendations:    Consider increase in Novolog to 10 units tid meal coverage if eats 50%  Thank you, Darel Hong E. Elmina Hendel, RN, MSN, CDE  Diabetes Coordinator Inpatient Glycemic Control Team Team Pager [phone] (8am-5pm) 01/21/2019 3:10 PM

## 2019-01-21 NOTE — Consult Note (Signed)
  Tele Assessment   Jason [name], 68 y.o., male patient reassessed via telepsych by this provider; chart reviewed and consulted with Dr. Lucianne Muss on 01/21/19.  On evaluation Jason [name] reports that he is no longer having suicidal thoughts or homicidal thoughts.  Patient states that he does not want to kill himself " I just want to hurt myself real bad."  Patient also states that he no longer wants to kill the med tech worker at the group home " I do not want to kill her.  I does want to hurt a real bad."  Patient denies auditory/visual hallucinations and paranoia at this time.  Patient says he has no problem with his appetite but is not eating as much related to not liking the taste of the food.  Patient also states that he is sleeping off and on.  Patient continues to state that he does not like the current group home where he lives and does not want to go back.  During evaluation Jason [name] is sitting up on side of the bed; he is alert/oriented x 4; calm/cooperative; and mood congruent with affect.  Patient is speaking in a clear tone at moderate volume, and normal pace; with good eye contact.  His thought process is coherent and relevant; There is no indication that he is currently responding to internal/external stimuli or experiencing delusional thought content.  Patient denies suicidal/self-harm/homicidal ideation, psychosis, and paranoia; but he is endorsing that he wants to hurt himself and hurt the med tech that works at the group home.  Patient still states that he does not want to go back to the group home because he does not like it there.  Patient has remained calm throughout assessment and has answered questions appropriately.  Patient wants social work to find him somewhere to live. Spoke with Alyse Low, RN (patient's nurse) who reports the patient has been fine today.  Reports patient has denied suicidal homicidal ideation but may only get upset when mentions  home    Recommendations:continue to seek inpatient psychiatric treatment.  SW may be able to give patient a list of local assisted living, or nursing home that patient can contact while waiting for placement.    Disposition: Recommend psychiatric Inpatient admission when medically cleared.   Spoke with Dr Deretha Emory; informed of above recommendation and disposition   Assunta Found, NP

## 2019-01-21 NOTE — ED Notes (Signed)
Pt lying in bed  

## 2019-01-21 NOTE — ED Notes (Signed)
Pt had a large BM in the bathroom and got feces all over him. Was cleaned up by this RN and student nurse

## 2019-01-22 DIAGNOSIS — F333 Major depressive disorder, recurrent, severe with psychotic symptoms: Secondary | ICD-10-CM | POA: Diagnosis not present

## 2019-01-22 LAB — CBG MONITORING, ED
GLUCOSE-CAPILLARY: 227 mg/dL — AB (ref 70–99)
Glucose-Capillary: 192 mg/dL — ABNORMAL HIGH (ref 70–99)
Glucose-Capillary: 289 mg/dL — ABNORMAL HIGH (ref 70–99)
Glucose-Capillary: 126 mg/dL — ABNORMAL HIGH (ref 70–99)
Glucose-Capillary: 218 mg/dL — ABNORMAL HIGH (ref 70–99)

## 2019-01-22 NOTE — ED Notes (Signed)
Pt ate all of dinner tray and snack. Pt is polite and cooperative with staff, watching TV at this time.

## 2019-01-22 NOTE — Progress Notes (Addendum)
CSW called and spoke to group home owner, Jimmye Norman, who stated that she was "afraid that someone on my staff, or another resident, will get hurt.  I don't think I can bring him back to the [facility]".  [facility] is a [facility] and apparently Immunologist with [employer].  CSW will call patient's guardian, Magdalene River to ask for further assistance.    Timmothy Euler. Kaylyn Lim, MSW, LCSWA Disposition Clinical Social Work [phone] (cell) [phone] (office)  CSW called Mr. Lorayne Marek and explained that group home is refusing to pick patient up and, that short of them doing that, we will need to make APS reports on both the [facility] and on him.  Mr. Lorayne Marek explained that as a court-appointed guardian, he can't really be reported to APS.  Mr. Lorayne Marek agreed to contact Jimmye Norman, owner of [facility] and get back to [place].

## 2019-01-22 NOTE — ED Provider Notes (Signed)
Emergency Medicine Observation Re-evaluation Note  Jason [name] is a 68 y.o. male, seen on rounds today.  Pt initially presented to the ED for complaints of V70.1 Currently, the patient is calm.  Physical Exam  BP (!) 108/58 (BP Location: Right Arm)   Pulse 68   Temp 98.8 F (37.1 C) (Oral)   Resp 20   Ht 6' (1.829 m)   Wt 131.5 kg   SpO2 98%   BMI 39.33 kg/m  Physical Exam  ED Course / MDM  WIO:XBDZ   I have reviewed the labs performed to date as well as medications administered while in observation.  Recent changes in the last 24 hours include none Plan  Current plan is for behavioral health's plan is for the patient be returned to group home. Patient is not under full IVC at this time.   Terrilee Files, MD 01/22/19 [phone]

## 2019-01-22 NOTE — Progress Notes (Signed)
Patient is seen by me via tele-psych and have consulted with Dr. Lucianne Muss.  Patient continues to deny any suicidal or homicidal ideations and denies any hallucinations.  Patient reports that he is taking his medications as prescribed.  Patient reports that his only issue is he does not want to go back to the group home and that that lady there does not like him and he does not like her and he does not want to be there to deal with her any longer.  Patient does report that he has been kicked out of some group homes that he is requested to leave others.  Patient states that he would be willing to go to another group home and would not harm himself or harm anyone else.  Patient states that he would go anywhere in the state to go to another group home including the [place] he just wants to be moved out of the group home and that is his only issue.  Patient states he does not feel that he needs to be in a psychiatric hospital but he was told that he would be placed in the hospital so they could find another group home for him to go live in.  Patient again denies having any suicidal or homicidal ideations and denies any hallucinations and just wants to be in a different facility.  He does state that if he has to return to that specific group home then there will be problems and he just wants to be moved.  Disposition staff will be in contact with patient's legal guardian to seek placement.  At this time patient does not meet inpatient criteria and is psychiatrically cleared.  I have contacted Dr. Charm Barges and notified him of the recommendations.

## 2019-01-22 NOTE — Progress Notes (Addendum)
CSW called and left vm for [name], pt. guardian from [facility], [phone]), requesting a return call to discuss patient's status and plans for placing him in another group home or emergency placement.  Jason [name]. Kaylyn Lim, MSW, LCSWA Disposition Clinical Social Work [phone] (cell) [phone] (office)

## 2019-01-22 NOTE — ED Notes (Signed)
Pt ambulatory to restroom

## 2019-01-22 NOTE — ED Notes (Signed)
Pt's group home refusing to take pt back per Gene from [facility]. Gene stated APS reports was going to be made.

## 2019-01-22 NOTE — ED Notes (Signed)
Pt requested bath, NT to assist pt at this time.

## 2019-01-22 NOTE — ED Notes (Signed)
Pt given meal tray at this time 

## 2019-01-22 NOTE — Progress Notes (Addendum)
CSW completed an APS report with [place] [facility] on behalf of pt.  When CSW asked when updates would be provided, DSS intake worker stated, "I don't know when you're gonna get a call back I'm just the intake worker. It probably won't be until Monday if someone get assigned to the case".   Wells Guiles, LCSW, LCAS Disposition CSW [facility] [phone]  UPDATE:  CSW spoke with [facility] APS supervisor, Myriam Forehand who shared that pt actually lives in [place] but she will screen the case and notify [place] DSS of situation. She will also ask [name] home owner to provide pt's most recent FL-2 to pt's guardian, in hopes of speeding up the placement process. Mrs. Morrow-Jennings stated that because there are safety concerns brought up regarding fear that pt would harm other patients and staff, the group home is able to decline pt's return immediately. The pt's guardian becomes the individual/agency who is solely responsible for finding alternative housing. According to earlier conversations, Mr. Lorayne Marek (legal guardian) is actively working on this.

## 2019-01-22 NOTE — ED Notes (Signed)
Pt given cup of cranberry juice.  

## 2019-01-22 NOTE — Discharge Instructions (Addendum)
Follow up per behavioral health and primary doctor. With all your medications it is very important for you to get an appointment with a primary care doctor in the next week to review.

## 2019-01-22 NOTE — Progress Notes (Signed)
Patient was seen and assessed by [facility], Physician Extender, Reola Calkins, NP.  Patient is cleared psychaitrically.  CSW called and left message for patient's guardian, Magdalene River to advise of patient's status.  Mr. Lorayne Marek requested that and FL-2 needed to be completed by hospital to plcae patient.  CSW explained that patients were not typically placed in SNF, ALF's, [facility] or Group Homes from the Emergency Department.  Patients are placed if they are inpatient only.  Mr. Lorayne Marek advised that patient would need to return to his group home and Mr. Lorayne Marek would need to pursue placement while patient is still there.  CSW called [facility] [phone]) and left a HIPAA compliant vm requesting a call back.    Timmothy Euler. Kaylyn Lim, MSW, LCSWA Disposition Clinical Social Work [phone] (cell) [phone] (office)

## 2019-01-22 NOTE — Progress Notes (Signed)
Inpatient Diabetes Program Recommendations  AACE/ADA: New Consensus Statement on Inpatient Glycemic Control (2015)  Target Ranges:  Prepandial:   less than 140 mg/dL      Peak postprandial:   less than 180 mg/dL (1-2 hours)      Critically ill patients:  140 - 180 mg/dL   Lab Results  Component Value Date   GLUCAP 192 (H) 01/22/2019    Review of Glycemic Control Results for Jason [name], Jason OUELLETTE (MRN [id number]) as of 01/22/2019 10:12  Ref. Range 01/21/2019 18:00 01/21/2019 21:29 01/22/2019 09:02  Glucose-Capillary Latest Ref Range: 70 - 99 mg/dL 235 (H) 361 (H) 443 (H)   Diabetes history: Type 2 DM Outpatient Diabetes medications: Glipizide 5 mg QD, Tresiba 30 units QHS, Humalog 57 units TID, Metformin 1000 mg BID Current orders for Inpatient glycemic control: Metformin 1000 mg BID, Glipizide 5 mg QAM, Lantus 40 units QHS, Novolog 6 units TID, Novolog 0-20 units TID, Novolog 0-5 units QHS   Inpatient Diabetes Program Recommendations:    Consider increase in Novolog to 10 units tid meal coverage if eats 50%.  Thanks, Lujean Rave, MSN, RNC-OB Diabetes Coordinator [phone] (8a-5p)

## 2019-01-23 DIAGNOSIS — F333 Major depressive disorder, recurrent, severe with psychotic symptoms: Secondary | ICD-10-CM | POA: Diagnosis not present

## 2019-01-23 LAB — CBG MONITORING, ED
GLUCOSE-CAPILLARY: 287 mg/dL — AB (ref 70–99)
Glucose-Capillary: 137 mg/dL — ABNORMAL HIGH (ref 70–99)
Glucose-Capillary: 157 mg/dL — ABNORMAL HIGH (ref 70–99)
Glucose-Capillary: 204 mg/dL — ABNORMAL HIGH (ref 70–99)

## 2019-01-23 NOTE — ED Notes (Signed)
Pt requesting food. Gave pt saltine crackers and peanut butter. He became upset, yelling for [place] steak. Told pt crackers is all we have. Pt said "well now ya pissed me off!" Will continue to monitor.

## 2019-01-23 NOTE — ED Notes (Signed)
Pt given crackers/peanutbutter. Lights turned out pt in bed

## 2019-01-23 NOTE — ED Notes (Signed)
Pt ambulating to BR °

## 2019-01-24 DIAGNOSIS — F333 Major depressive disorder, recurrent, severe with psychotic symptoms: Secondary | ICD-10-CM | POA: Diagnosis not present

## 2019-01-24 LAB — BASIC METABOLIC PANEL
Anion gap: 16 — ABNORMAL HIGH (ref 5–15)
BUN: 33 mg/dL — ABNORMAL HIGH (ref 8–23)
CALCIUM: 9.3 mg/dL (ref 8.9–10.3)
CO2: 28 mmol/L (ref 22–32)
Chloride: 90 mmol/L — ABNORMAL LOW (ref 98–111)
Creatinine, Ser: 1.3 mg/dL — ABNORMAL HIGH (ref 0.61–1.24)
GFR calc Af Amer: >60 mL/min (ref >60)
GFR calc non Af Amer: 57 mL/min — ABNORMAL LOW (ref >60)
Glucose, Bld: 265 mg/dL — ABNORMAL HIGH (ref 70–99)
Potassium: 2.9 mmol/L — ABNORMAL LOW (ref 3.5–5.1)
Sodium: 134 mmol/L — ABNORMAL LOW (ref 135–145)

## 2019-01-24 LAB — CBG MONITORING, ED
GLUCOSE-CAPILLARY: 120 mg/dL — AB (ref 70–99)
GLUCOSE-CAPILLARY: 276 mg/dL — AB (ref 70–99)
Glucose-Capillary: 235 mg/dL — ABNORMAL HIGH (ref 70–99)
Glucose-Capillary: 184 mg/dL — ABNORMAL HIGH (ref 70–99)

## 2019-01-24 MED ORDER — MAGNESIUM OXIDE 400 (241.3 MG) MG PO TABS
400.0000 mg | ORAL_TABLET | Freq: Two times a day (BID) | ORAL | Status: AC
  Administered 2019-01-24 – 2019-01-25 (×2): 400 mg via ORAL
  Filled 2019-01-24 (×2): qty 1

## 2019-01-24 MED ORDER — POTASSIUM CHLORIDE CRYS ER 20 MEQ PO TBCR
40.0000 meq | EXTENDED_RELEASE_TABLET | Freq: Two times a day (BID) | ORAL | Status: AC
  Administered 2019-01-24 – 2019-01-25 (×2): 40 meq via ORAL
  Filled 2019-01-24 (×2): qty 2

## 2019-01-24 NOTE — ED Notes (Signed)
Gave pt cold water to drink per pt request

## 2019-01-24 NOTE — ED Notes (Signed)
Pt found on floor of room. Assisted by ED staff back into bed.   Call from telesitter stating "pt knew what he was doing. He slowly slid onto the floor from his chair."

## 2019-01-24 NOTE — ED Notes (Signed)
Given meal tray.

## 2019-01-24 NOTE — ED Notes (Signed)
Pt oob sitting in chair.  Calm and cooperative

## 2019-01-24 NOTE — ED Notes (Signed)
Called to room by security.  Pt found lying on RT side on floor.  Pt states he feels like he cant breath.  Denies any pain.  Assisted up from the floor with help of 3 other staff members. Edp called to bedside.

## 2019-01-25 DIAGNOSIS — F333 Major depressive disorder, recurrent, severe with psychotic symptoms: Secondary | ICD-10-CM | POA: Diagnosis not present

## 2019-01-25 LAB — CBG MONITORING, ED
GLUCOSE-CAPILLARY: 155 mg/dL — AB (ref 70–99)
Glucose-Capillary: 155 mg/dL — ABNORMAL HIGH (ref 70–99)
Glucose-Capillary: 122 mg/dL — ABNORMAL HIGH (ref 70–99)
Glucose-Capillary: 191 mg/dL — ABNORMAL HIGH (ref 70–99)

## 2019-01-25 LAB — POTASSIUM: Potassium: 3.3 mmol/L — ABNORMAL LOW (ref 3.5–5.1)

## 2019-01-25 MED ORDER — INSULIN ASPART 100 UNIT/ML ~~LOC~~ SOLN
10.0000 [IU] | Freq: Three times a day (TID) | SUBCUTANEOUS | Status: DC
  Administered 2019-01-25 – 2019-01-26 (×3): 10 [IU] via SUBCUTANEOUS
  Filled 2019-01-25 (×3): qty 1

## 2019-01-25 NOTE — ED Notes (Signed)
Patient was given a shower with assistance from nurse. Patient has three black round wounds to lower left leg with new quarter size blister area to lower left leg. Skin intact to this area at this time. New approximately 2 1/2 " abrasion noted to upper left leg. Patient states he scratched himself when he was upset. No bleeding or drainage noted to area. Bedding changed and new bedding applied to bed. Patient brought back to room and placed in bed.

## 2019-01-25 NOTE — ED Notes (Signed)
Patient lying in bed sleeping at this time. Equal rise and fall of chest noted. 

## 2019-01-25 NOTE — Clinical Social Work Note (Addendum)
LCSW spoke with legal guardian, Jason [name], who indicated that patient had referred to about 15 facilities this morning and that he was hopeful that a place in [place] would accept him. [name] stated that patient has not been physically harmful to others in about two years due to his continuing physical health decline.       Ardine Iacovelli, Juleen China, LCSW

## 2019-01-25 NOTE — ED Notes (Signed)
Patient eating meal tray at this time. 

## 2019-01-25 NOTE — ED Notes (Signed)
Spoke with pt's guardian (Mr. Lorayne Marek) who states he does not have an updated FL2 and he can refer him to a higher level of care if this is completed.

## 2019-01-25 NOTE — ED Notes (Signed)
Gave patient meal tray.

## 2019-01-25 NOTE — Clinical Social Work Note (Addendum)
FL2 (currently not signed by MD) with ALF level of care faxed to legal guardian at [phone]. Fax confirmation received.   LCSW will send signed FL2 upon ED MD signing.     Teryn Boerema, Juleen China, LCSW

## 2019-01-25 NOTE — ED Notes (Signed)
Patient ambulated to shower, has agreed to take a shower.

## 2019-01-25 NOTE — NC FL2 (Signed)
[place] MEDICAID FL2 LEVEL OF CARE SCREENING TOOL     IDENTIFICATION  Patient Name: Jason [name] Birthdate: Mar 14, 1951 Sex: male Admission Date (Current Location): 01/18/2019  [place] and [place] Number:  Aaron Edelman [id number] T Facility and Address:  [facility],  [address] [place] [address]      Provider Number: [id number]  Attending Physician Name and Address:  Default, Provider, MD  Relative Name and Phone Number:       Current Level of Care: Other (Comment)(Emergency Department) Recommended Level of Care: Assisted Living Facility Prior Approval Number:    Date Approved/Denied:   PASRR Number:    Discharge Plan: Domiciliary (Rest home)(ALF)    Current Diagnoses: Patient Active Problem List   Diagnosis Date Noted  . Community acquired pneumonia 10/14/2018  . Dizziness 08/25/2018  . Hypokalemia due to inadequate potassium intake 08/16/2018  . Hypertension 08/07/2018  . Malingering 08/07/2018  . Intellectual disability 08/07/2018  . Paranoid schizophrenia (HCC) 07/23/2018  . Diabetes mellitus without complication (HCC) 07/23/2018  . Tourette disorder 07/23/2018    Orientation RESPIRATION BLADDER Height & Weight     Self, Situation, Place  Normal Continent Weight: 290 lb (131.5 kg) Height:  6' (182.9 cm)  BEHAVIORAL SYMPTOMS/MOOD NEUROLOGICAL BOWEL NUTRITION STATUS      Continent Diet(Carb Modified)  AMBULATORY STATUS COMMUNICATION OF NEEDS Skin   Limited Assist Verbally Other (Comment)(Intact blister to left lower leg; multiple abrasions to left upper leg with yellow drainage)                       Personal Care Assistance Level of Assistance  Bathing, Feeding, Dressing Bathing Assistance: Limited assistance Feeding assistance: Independent(Set up assistance) Dressing Assistance: Limited assistance     Functional Limitations Info  Sight, Hearing, Speech Sight Info: Adequate Hearing Info: Adequate Speech Info: Adequate     SPECIAL CARE FACTORS FREQUENCY                       Contractures Contractures Info: Not present    Additional Factors Info  Insulin Sliding Scale Code Status Info: Full Code Allergies Info: Keflex Psychotropic Info: Cogentin, Depakote ER, Ativan, Risperdal         Current Medications (01/25/2019):  This is the current hospital active medication list Current Facility-Administered Medications  Medication Dose Route Frequency Provider Last Rate Last Dose  . amLODipine (NORVASC) tablet 10 mg  10 mg Oral Daily Gilda Crease, MD   10 mg at 01/25/19 1006  . aspirin EC tablet 325 mg  325 mg Oral Daily Gilda Crease, MD   325 mg at 01/25/19 1004  . atorvastatin (LIPITOR) tablet 80 mg  80 mg Oral QHS Gilda Crease, MD   80 mg at 01/24/19 2153  . benztropine (COGENTIN) tablet 0.5 mg  0.5 mg Oral BID Gilda Crease, MD   0.5 mg at 01/25/19 1005  . carvedilol (COREG) tablet 6.25 mg  6.25 mg Oral BID Gilda Crease, MD   6.25 mg at 01/25/19 1008  . clopidogrel (PLAVIX) tablet 75 mg  75 mg Oral Daily Gilda Crease, MD   75 mg at 01/25/19 1007  . divalproex (DEPAKOTE ER) 24 hr tablet 1,000 mg  1,000 mg Oral QAC supper Gilda Crease, MD   1,000 mg at 01/24/19 1827  . ezetimibe (ZETIA) tablet 10 mg  10 mg Oral Daily Gilda Crease, MD   10 mg at 01/25/19 1005  .  furosemide (LASIX) tablet 40 mg  40 mg Oral BID Gilda Crease, MD   40 mg at 01/25/19 0842  . gabapentin (NEURONTIN) capsule 300 mg  300 mg Oral TID Gilda Crease, MD   300 mg at 01/25/19 1608  . glipiZIDE (GLUCOTROL) tablet 5 mg  5 mg Oral Q breakfast Pollina, Canary Brim, MD   5 mg at 01/25/19 0843  . insulin aspart (novoLOG) injection 0-20 Units  0-20 Units Subcutaneous TID WC Gilda Crease, MD   4 Units at 01/25/19 1248  . insulin aspart (novoLOG) injection 0-5 Units  0-5 Units Subcutaneous QHS Gilda Crease, MD   2 Units at  01/23/19 2215  . insulin aspart (novoLOG) injection 10 Units  10 Units Subcutaneous TID WC Blane Ohara, MD   10 Units at 01/25/19 1248  . insulin glargine (LANTUS) injection 40 Units  40 Units Subcutaneous QHS Gilda Crease, MD   40 Units at 01/24/19 2150  . isosorbide mononitrate (IMDUR) 24 hr tablet 60 mg  60 mg Oral Daily Gilda Crease, MD   60 mg at 01/25/19 1008  . LORazepam (ATIVAN) tablet 0.5 mg  0.5 mg Oral TID Gilda Crease, MD   0.5 mg at 01/25/19 1608  . metFORMIN (GLUCOPHAGE) tablet 1,000 mg  1,000 mg Oral BID Gilda Crease, MD   1,000 mg at 01/25/19 1007  . metolazone (ZAROXOLYN) tablet 2.5 mg  2.5 mg Oral Daily Pollina, Canary Brim, MD   2.5 mg at 01/25/19 1005  . OLANZapine zydis (ZYPREXA) disintegrating tablet 10 mg  10 mg Oral Q8H PRN Pollina, Canary Brim, MD       And  . ziprasidone (GEODON) injection 20 mg  20 mg Intramuscular PRN Pollina, Canary Brim, MD      . Pimozide TABS 1 mg  1 mg Oral BID Gilda Crease, MD   Stopped at 01/20/19 9747  . potassium chloride SA (K-DUR,KLOR-CON) CR tablet 20 mEq  20 mEq Oral BID Gilda Crease, MD   20 mEq at 01/25/19 1008  . risperiDONE (RISPERDAL) tablet 1.5 mg  1.5 mg Oral BID Gilda Crease, MD   1.5 mg at 01/25/19 1006  . tamsulosin (FLOMAX) capsule 0.4 mg  0.4 mg Oral Daily Pollina, Canary Brim, MD   0.4 mg at 01/25/19 1004   Current Outpatient Medications  Medication Sig Dispense Refill  . acetaminophen (TYLENOL) 500 MG tablet Take 500 mg by mouth every 4 (four) hours as needed for mild pain or fever.     Marland Kitchen amLODipine (NORVASC) 10 MG tablet Take 10 mg by mouth daily.    Marland Kitchen aspirin EC 325 MG tablet Take 325 mg by mouth daily.    Marland Kitchen atorvastatin (LIPITOR) 80 MG tablet Take 80 mg by mouth at bedtime.     . benztropine (COGENTIN) 0.5 MG tablet Take 0.5 mg by mouth 2 (two) times daily.    . carvedilol (COREG) 6.25 MG tablet Take 6.25 mg by mouth 2 (two) times daily.      . clopidogrel (PLAVIX) 75 MG tablet Take 75 mg by mouth daily.    . divalproex (DEPAKOTE ER) 500 MG 24 hr tablet Take 1,000 mg by mouth daily before supper. At 1600    . ezetimibe (ZETIA) 10 MG tablet Take 10 mg by mouth daily.    . furosemide (LASIX) 40 MG tablet Take 1 tablet (40 mg total) by mouth 2 (two) times daily. 60 tablet 2  . gabapentin (NEURONTIN) 300  MG capsule Take 300 mg by mouth 3 (three) times daily.    Marland Kitchen glipiZIDE (GLUCOTROL) 5 MG tablet Take 5 mg by mouth daily.     . insulin degludec (TRESIBA) 100 UNIT/ML SOPN FlexTouch Pen Inject 30 Units into the skin at bedtime.    . insulin lispro (HUMALOG) 100 UNIT/ML injection Inject 57 Units into the skin 3 (three) times daily before meals.    . isosorbide mononitrate (IMDUR) 60 MG 24 hr tablet Take 60 mg by mouth daily.    Marland Kitchen LORazepam (ATIVAN) 0.5 MG tablet Take 0.5 mg by mouth 3 (three) times daily.     . metFORMIN (GLUCOPHAGE) 1000 MG tablet Take 1,000 mg by mouth 2 (two) times daily.     . metolazone (ZAROXOLYN) 2.5 MG tablet Take 2.5 mg by mouth daily.    . nitroGLYCERIN (NITROSTAT) 0.4 MG SL tablet Place 0.4 mg under the tongue every 5 (five) minutes as needed for chest pain.    . Pimozide 1 MG TABS Take 1 mg by mouth 2 (two) times daily.    . potassium chloride 20 MEQ TBCR Take 20 mEq by mouth 2 (two) times daily. (Patient taking differently: Take 40 mEq by mouth 2 (two) times daily. ) 60 tablet 2  . risperiDONE (RISPERDAL) 1 MG tablet Take 1.5 mg by mouth 2 (two) times daily.     . tamsulosin (FLOMAX) 0.4 MG CAPS capsule Take 0.4 mg by mouth daily.     . Vitamin D, Ergocalciferol, (DRISDOL) 50000 units CAPS capsule Take 50,000 Units by mouth every Friday.        Discharge Medications: Please see discharge summary for a list of discharge medications.  Relevant Imaging Results:  Relevant Lab Results:   Additional Information SSN [id number]  Annice Needy, LCSW

## 2019-01-25 NOTE — ED Notes (Signed)
Pt required significant assistance from myself to bathe along with shower chair.  Was unable to bathe himself other than a few areas due to physical limitations and unsteadiness.  Pt required moderate assistance from staff for dressing and was only able to put his own shirt on.  Cannot reach to put his pants or shoes on due to imbalance.  Pt was only able to stand for short period of time while holding onto grab bar in shower for nurse to assist with bathing and then had to be seated again.  Pt was cooperative in the shower.

## 2019-01-25 NOTE — ED Notes (Signed)
Patient ate 75% of meal tray.

## 2019-01-25 NOTE — ED Notes (Signed)
Patient given meal tray. Patient sitting on side of bed eating at this time.

## 2019-01-26 DIAGNOSIS — F333 Major depressive disorder, recurrent, severe with psychotic symptoms: Secondary | ICD-10-CM | POA: Diagnosis not present

## 2019-01-26 LAB — CBG MONITORING, ED
GLUCOSE-CAPILLARY: 95 mg/dL (ref 70–99)
Glucose-Capillary: 77 mg/dL (ref 70–99)
Glucose-Capillary: 148 mg/dL — ABNORMAL HIGH (ref 70–99)
Glucose-Capillary: 105 mg/dL — ABNORMAL HIGH (ref 70–99)
Glucose-Capillary: 225 mg/dL — ABNORMAL HIGH (ref 70–99)

## 2019-01-26 MED ORDER — INSULIN ASPART 100 UNIT/ML ~~LOC~~ SOLN
6.0000 [IU] | Freq: Three times a day (TID) | SUBCUTANEOUS | Status: DC
  Administered 2019-01-26 – 2019-02-01 (×14): 6 [IU] via SUBCUTANEOUS
  Filled 2019-01-26 (×15): qty 1

## 2019-01-26 MED ORDER — TUBERCULIN PPD 5 UNIT/0.1ML ID SOLN
5.0000 [IU] | Freq: Once | INTRADERMAL | Status: AC
  Administered 2019-01-27: 5 [IU] via INTRADERMAL
  Filled 2019-01-26: qty 0.1

## 2019-01-26 MED ORDER — TUBERCULIN PPD 5 UNIT/0.1ML ID SOLN
5.0000 [IU] | Freq: Once | INTRADERMAL | Status: DC
  Filled 2019-01-26: qty 0.1

## 2019-01-26 NOTE — Clinical Social Work Note (Signed)
Guardian, Jason [name], indicated that two providers ([facility] and [facility])  are scheduled to come and assess patient on tomorrow 01/27/2019. He stated that [facility] in [place] may also be interested. He stated that a lot of facilities were not coming accepting new residents due to coronovirus concerns and that some facilities did not want to assess patient's in the hospital due to the same concern.  [name] requested that patient have a TB test administered as a facility would request that.   Jason [name], Jason China, LCSW

## 2019-01-26 NOTE — Progress Notes (Signed)
Inpatient Diabetes Program Recommendations  AACE/ADA: New Consensus Statement on Inpatient Glycemic Control (2015)  Target Ranges:  Prepandial:   less than 140 mg/dL      Peak postprandial:   less than 180 mg/dL (1-2 hours)      Critically ill patients:  140 - 180 mg/dL   Lab Results  Component Value Date   GLUCAP 148 (H) 01/26/2019    Review of Glycemic Control  Inpatient Diabetes Program Recommendations:   Decrease Lantus to 30 units daily D/C hs correction scale  Thank you, Jason [name]. Jason Vanburen, RN, MSN, CDE  Diabetes Coordinator Inpatient Glycemic Control Team Team Pager [phone] (8am-5pm) 01/26/2019 11:08 AM

## 2019-01-27 DIAGNOSIS — F333 Major depressive disorder, recurrent, severe with psychotic symptoms: Secondary | ICD-10-CM | POA: Diagnosis not present

## 2019-01-27 LAB — CBG MONITORING, ED
GLUCOSE-CAPILLARY: 103 mg/dL — AB (ref 70–99)
Glucose-Capillary: 83 mg/dL (ref 70–99)
Glucose-Capillary: 119 mg/dL — ABNORMAL HIGH (ref 70–99)
Glucose-Capillary: 144 mg/dL — ABNORMAL HIGH (ref 70–99)

## 2019-01-27 MED ORDER — INSULIN GLARGINE 100 UNIT/ML ~~LOC~~ SOLN
30.0000 [IU] | Freq: Every day | SUBCUTANEOUS | Status: DC
  Administered 2019-01-27 – 2019-01-31 (×5): 30 [IU] via SUBCUTANEOUS
  Filled 2019-01-27 (×6): qty 0.3

## 2019-01-27 NOTE — ED Notes (Addendum)
Will give 9 units of insulin once meal tray gets here

## 2019-01-27 NOTE — ED Notes (Addendum)
Per telesitter, pt will not be monitored for 9-10 due to downtime

## 2019-01-27 NOTE — ED Notes (Signed)
Spoke with Herbert Seta, with social work and she states that there are 2 facilities that will be coming to see pt 01/26/19 for review of accepting at their facility. Herbert Seta states pt would need TB skin test. Spoke with Dr.Zammit and TB test will be ordered for pt.

## 2019-01-27 NOTE — ED Notes (Signed)
Spoke with AC-kathy about getting pt TB skin test. AC unable to obtain TB injection due to pharmacy not being here. [facility] advised to give TB test at 8 AM.

## 2019-01-27 NOTE — ED Notes (Signed)
Patient assisted to the restroom 

## 2019-01-27 NOTE — ED Notes (Signed)
Have paged pharmacy for TB test

## 2019-01-27 NOTE — ED Notes (Addendum)
TB test given in left forearm, bubble noted, RN circled area, will in be read in 48 hrs (Friday)

## 2019-01-27 NOTE — Progress Notes (Signed)
Inpatient Diabetes Program Recommendations  AACE/ADA: New Consensus Statement on Inpatient Glycemic Control (2015)  Target Ranges:  Prepandial:   less than 140 mg/dL      Peak postprandial:   less than 180 mg/dL (1-2 hours)      Critically ill patients:  140 - 180 mg/dL   Lab Results  Component Value Date   GLUCAP 119 (H) 01/27/2019    Review of Glycemic Control  Inpatient Diabetes Program Recommendations:   Decrease Lantus to 30 units daily D/C hs correction scale  Thank you, Billy Fischer. Izyk Marty, RN, MSN, CDE  Diabetes Coordinator Inpatient Glycemic Control Team Team Pager [phone] (8am-5pm) 01/27/2019 3:00 PM

## 2019-01-27 NOTE — Clinical Social Work Note (Signed)
LCSW notified legal guardian, [name], that facilities did not come to see patient. He stated that he would contact the facilities to identify barriers.   LCSW provided status update to patient. Patient advises that he is ready to leave the ED but does not want to go back to [facility]. LCSW advised that [facility] care was not an option for his return due to his behaviors so he did not have to be concerned about returning.     Jason [name], Jason China, LCSW

## 2019-01-27 NOTE — ED Notes (Signed)
Pt sleeping with equal rise and chest fall  

## 2019-01-28 DIAGNOSIS — F333 Major depressive disorder, recurrent, severe with psychotic symptoms: Secondary | ICD-10-CM | POA: Diagnosis not present

## 2019-01-28 LAB — CBG MONITORING, ED
GLUCOSE-CAPILLARY: 83 mg/dL (ref 70–99)
GLUCOSE-CAPILLARY: 182 mg/dL — AB (ref 70–99)
Glucose-Capillary: 115 mg/dL — ABNORMAL HIGH (ref 70–99)
Glucose-Capillary: 131 mg/dL — ABNORMAL HIGH (ref 70–99)
Glucose-Capillary: 119 mg/dL — ABNORMAL HIGH (ref 70–99)
Glucose-Capillary: 158 mg/dL — ABNORMAL HIGH (ref 70–99)

## 2019-01-28 NOTE — ED Notes (Signed)
Possible nursing home facility person at bedside

## 2019-01-28 NOTE — ED Notes (Signed)
Pt is stating it is hard to get his breath. Pt is oxygenating at 98% on RA. Non labored respirations. HR is 76.

## 2019-01-28 NOTE — ED Notes (Signed)
CBG at 0830 was 83. Did not cross over due to downtime.

## 2019-01-28 NOTE — Clinical Social Work Note (Signed)
Herma Carson, came to assess patient on behalf of [facility].  She was provided patient's FL2 and proof of TB skin test being administered. She stated that she would complete the assessment and send it back to [facility].     Clayborne Divis, Juleen China, LCSW

## 2019-01-28 NOTE — ED Notes (Signed)
[facility] coming to assess late evening or early in the morning to assess patient.

## 2019-01-28 NOTE — Clinical Social Work Note (Signed)
Guardian, [name], indicated that [facility] was interested in patient and would send someone to assess patient late this evening or tomorrow morning.      Evely Gainey, Juleen China, LCSW

## 2019-01-28 NOTE — ED Notes (Signed)
Pt ambulatory to restroom

## 2019-01-29 DIAGNOSIS — F333 Major depressive disorder, recurrent, severe with psychotic symptoms: Secondary | ICD-10-CM | POA: Diagnosis not present

## 2019-01-29 LAB — CBG MONITORING, ED
GLUCOSE-CAPILLARY: 97 mg/dL (ref 70–99)
GLUCOSE-CAPILLARY: 176 mg/dL — AB (ref 70–99)
Glucose-Capillary: 127 mg/dL — ABNORMAL HIGH (ref 70–99)
Glucose-Capillary: 251 mg/dL — ABNORMAL HIGH (ref 70–99)

## 2019-01-29 NOTE — Clinical Social Work Note (Signed)
LCSW followed up with legal guardian, Magdalene River, regarding [facility] placement. LCSW confirmed that he was assessed yesterday. [name] indicated that he would follow up with the facility.     Meldrick Buttery, Juleen China, LCSW

## 2019-01-29 NOTE — Clinical Social Work Note (Addendum)
LCSW met with patient and provided status update.  Legal guardian indicated that he is working on transportation for patient to go to [facility].   Korene Dula, Clydene Pugh, LCSW

## 2019-01-29 NOTE — Progress Notes (Signed)
Inpatient Diabetes Program Recommendations  AACE/ADA: New Consensus Statement on Inpatient Glycemic Control (2015)  Target Ranges:  Prepandial:   less than 140 mg/dL      Peak postprandial:   less than 180 mg/dL (1-2 hours)      Critically ill patients:  140 - 180 mg/dL   Lab Results  Component Value Date   GLUCAP 127 (H) 01/29/2019    In preparation for DC to SNF, consider the following as tis most closely reflects inpatient needs:  Continue Tresiba 30 units QHS, Metformin 1000 mg BID, Glipizide 5 mg QAM. Modify Novolog to 6 units TID and add Novolog 0-15 units TID correction scale.   Thanks, Lujean Rave, MSN, RNC-OB Diabetes Coordinator [phone] (8a-5p)

## 2019-01-29 NOTE — ED Notes (Signed)
Pt's TB skin test is negative

## 2019-01-29 NOTE — ED Notes (Signed)
Patient denies pain and is resting comfortably.  

## 2019-01-30 ENCOUNTER — Emergency Department (HOSPITAL_COMMUNITY): Payer: Medicare Other

## 2019-01-30 DIAGNOSIS — F333 Major depressive disorder, recurrent, severe with psychotic symptoms: Secondary | ICD-10-CM | POA: Diagnosis not present

## 2019-01-30 LAB — BASIC METABOLIC PANEL
Anion gap: 14 (ref 5–15)
BUN: 34 mg/dL — ABNORMAL HIGH (ref 8–23)
CO2: 29 mmol/L (ref 22–32)
Calcium: 9.1 mg/dL (ref 8.9–10.3)
Chloride: 91 mmol/L — ABNORMAL LOW (ref 98–111)
Creatinine, Ser: 1.35 mg/dL — ABNORMAL HIGH (ref 0.61–1.24)
GFR calc Af Amer: >60 mL/min (ref >60)
GFR calc non Af Amer: 55 mL/min — ABNORMAL LOW (ref >60)
Glucose, Bld: 209 mg/dL — ABNORMAL HIGH (ref 70–99)
POTASSIUM: 3.1 mmol/L — AB (ref 3.5–5.1)
Sodium: 134 mmol/L — ABNORMAL LOW (ref 135–145)

## 2019-01-30 LAB — CBC WITH DIFFERENTIAL/PLATELET
Abs Immature Granulocytes: 0.05 10*3/uL (ref 0.00–0.07)
Basophils Absolute: 0.1 10*3/uL (ref 0.0–0.1)
Basophils Relative: 1 %
Eosinophils Absolute: 0.5 10*3/uL (ref 0.0–0.5)
Eosinophils Relative: 6 %
HCT: 33.7 % — ABNORMAL LOW (ref 39.0–52.0)
Hemoglobin: 11.7 g/dL — ABNORMAL LOW (ref 13.0–17.0)
Immature Granulocytes: 1 %
Lymphocytes Relative: 29 %
Lymphs Abs: 2.1 10*3/uL (ref 0.7–4.0)
MCH: 29.4 pg (ref 26.0–34.0)
MCHC: 34.7 g/dL (ref 30.0–36.0)
MCV: 84.7 fL (ref 80.0–100.0)
Monocytes Absolute: 0.9 10*3/uL (ref 0.1–1.0)
Monocytes Relative: 13 %
Neutro Abs: 3.7 10*3/uL (ref 1.7–7.7)
Neutrophils Relative %: 50 %
Platelets: 178 10*3/uL (ref 150–400)
RBC: 3.98 MIL/uL — ABNORMAL LOW (ref 4.22–5.81)
RDW: 14.2 % (ref 11.5–15.5)
WBC: 7.3 10*3/uL (ref 4.0–10.5)
nRBC: 0 % (ref 0.0–0.2)

## 2019-01-30 LAB — URINALYSIS, ROUTINE W REFLEX MICROSCOPIC
Bacteria, UA: NONE SEEN
Bilirubin Urine: NEGATIVE
GLUCOSE, UA: NEGATIVE mg/dL
Ketones, ur: NEGATIVE mg/dL
Leukocytes,Ua: NEGATIVE
Nitrite: NEGATIVE
Protein, ur: NEGATIVE mg/dL
Specific Gravity, Urine: 1.009 (ref 1.005–1.030)
pH: 6 (ref 5.0–8.0)

## 2019-01-30 LAB — COMPREHENSIVE METABOLIC PANEL
ALK PHOS: 52 U/L (ref 38–126)
ALT: 16 U/L (ref 0–44)
AST: 20 U/L (ref 15–41)
Albumin: 3.6 g/dL (ref 3.5–5.0)
Anion gap: 17 — ABNORMAL HIGH (ref 5–15)
BUN: 44 mg/dL — ABNORMAL HIGH (ref 8–23)
CO2: 27 mmol/L (ref 22–32)
Calcium: 9.2 mg/dL (ref 8.9–10.3)
Chloride: 90 mmol/L — ABNORMAL LOW (ref 98–111)
Creatinine, Ser: 1.39 mg/dL — ABNORMAL HIGH (ref 0.61–1.24)
GFR calc Af Amer: >60 mL/min (ref >60)
GFR calc non Af Amer: 53 mL/min — ABNORMAL LOW (ref >60)
Glucose, Bld: 139 mg/dL — ABNORMAL HIGH (ref 70–99)
Potassium: 2.9 mmol/L — ABNORMAL LOW (ref 3.5–5.1)
Sodium: 134 mmol/L — ABNORMAL LOW (ref 135–145)
Total Bilirubin: 0.5 mg/dL (ref 0.3–1.2)
Total Protein: 6.6 g/dL (ref 6.5–8.1)

## 2019-01-30 LAB — TROPONIN I: Troponin I: <0.03 ng/mL (ref <0.03)

## 2019-01-30 LAB — CBG MONITORING, ED
GLUCOSE-CAPILLARY: 124 mg/dL — AB (ref 70–99)
Glucose-Capillary: 89 mg/dL (ref 70–99)
Glucose-Capillary: 182 mg/dL — ABNORMAL HIGH (ref 70–99)
Glucose-Capillary: 201 mg/dL — ABNORMAL HIGH (ref 70–99)
Glucose-Capillary: 210 mg/dL — ABNORMAL HIGH (ref 70–99)

## 2019-01-30 LAB — MAGNESIUM: Magnesium: 1.8 mg/dL (ref 1.7–2.4)

## 2019-01-30 MED ORDER — POTASSIUM CHLORIDE CRYS ER 20 MEQ PO TBCR
20.0000 meq | EXTENDED_RELEASE_TABLET | Freq: Two times a day (BID) | ORAL | Status: DC
  Administered 2019-01-30 – 2019-01-31 (×2): 20 meq via ORAL
  Filled 2019-01-30 (×6): qty 1

## 2019-01-30 MED ORDER — MAGNESIUM SULFATE 2 GM/50ML IV SOLN
2.0000 g | Freq: Once | INTRAVENOUS | Status: AC
  Administered 2019-01-30: 2 g via INTRAVENOUS
  Filled 2019-01-30: qty 50

## 2019-01-30 MED ORDER — ACETAMINOPHEN 325 MG PO TABS
650.0000 mg | ORAL_TABLET | Freq: Once | ORAL | Status: AC
  Administered 2019-01-30: 650 mg via ORAL
  Filled 2019-01-30: qty 2

## 2019-01-30 MED ORDER — POTASSIUM CHLORIDE 10 MEQ/100ML IV SOLN
10.0000 meq | INTRAVENOUS | Status: AC
  Administered 2019-01-30 (×3): 10 meq via INTRAVENOUS
  Filled 2019-01-30 (×3): qty 100

## 2019-01-30 MED ORDER — ACETAMINOPHEN 325 MG PO TABS
ORAL_TABLET | ORAL | Status: AC
  Filled 2019-01-30: qty 1

## 2019-01-30 NOTE — ED Notes (Addendum)
Pt has been up frequently through the night, sitting in the chair watching TV, eating and ambulating back and forth to restroom. He ambulated to the restroom this time and on return he sat in chair in the hallway, required w/c assistance back to bed, said he "feels weak" and appears more lethargic, CBG 127, reports he "hurts all over". VSS on assessment, placed on monitor and EDP notified to bedside for reeval, new orders obtained.

## 2019-01-30 NOTE — ED Notes (Signed)
Pt assisted to the restroom and back to room. Once pt was back in room pt sat in the recliner and became "unresponsive." Dr. Pilar Plate notified. CBG and vitals updated all within normal limits. EDP examined pt back to baseline.

## 2019-01-30 NOTE — ED Notes (Addendum)
Received call from [employer] (social work) stating that she was able to get in touch with pt's legal guardian. Guardian stated they had been in touch with facility and the facility does not do admissions on the weekend. Per Herbert Seta, guardian will be working with the facility to have facility come pick up patient on Monday. Consulting civil engineer notified. Pt will need prescription from EDP for 30 days worth of his medications.

## 2019-01-30 NOTE — ED Notes (Signed)
Patient transported to CT 

## 2019-01-30 NOTE — ED Notes (Signed)
Pt refusing to keep on blood pressure cuff, pulse ox, and cardiac leads.

## 2019-01-30 NOTE — ED Notes (Signed)
Called [facility] to get on-call SW.  Clydie Braun should be returning call to [place].

## 2019-01-30 NOTE — ED Notes (Signed)
Pt given breakfast meal tray. 

## 2019-01-30 NOTE — ED Notes (Signed)
Lab states because patient refused blood work twice they have to discontinue order and bloodwork must be reordered by nursing staff.

## 2019-01-30 NOTE — ED Notes (Signed)
Patient aware that we need urine sample for testing, unable at this time. Pt given instruction on providing urine sample when able to do so.   

## 2019-01-30 NOTE — ED Notes (Signed)
Patient using portable phone to speak with Thurmond Butts, patient's brother. Gave pt crackers and sprite.

## 2019-01-30 NOTE — ED Provider Notes (Signed)
Patient had some nonspecific chest pain earlier in the evening for which EKG was done and was unchanged from previous.  Patient was reported on the way to the bathroom and had a sudden onset of generalized weakness.  His blood sugar was checked it was in the 120s.  His vital signs were within normal limits with a normal temperature.  Examination patient is answering questions and awake and alert with intermittent bradycardia in the 50s with what Appears to be type I Mobitz. Will continue to monitor. check a troponin, head CT, CBC and CMP as he recently was having hypokalemia.  Also get a magnesium.     Nithin Demeo, Barbara Cower, MD 01/31/19 [phone]

## 2019-01-30 NOTE — ED Notes (Signed)
Encouraged patient to take daily medications. Stressed importance of taking medications to control BP, HR, sugar, and pain. Used therapeutic communication, patient still refused. Patient did allow me to give insulin shot.

## 2019-01-30 NOTE — ED Notes (Signed)
Patient's sitter stated pt wanted to speak with me regarding medications. Upon entrance into room patient was sleeping. Woke pt up to speak about medications, patient stated he did not want to take meds at this time.

## 2019-01-30 NOTE — ED Notes (Signed)
Pt upset that he has not left ED for ALF in [place]. Attempting to get in touch with Social Work. Pt states he is upset that Social Work is not answering phone, so he refuses to take his medication.

## 2019-01-30 NOTE — ED Notes (Signed)
Pt out to nurses station stating he has 8/10 chest pain and wants tylenol. "its not a heart attack, I just need tylenol". EDP notified, ekg completed and given to provider. Pt given PB and graham crackers.

## 2019-01-30 NOTE — ED Notes (Signed)
Pt removing monitoring equipment, refusing to wear cardiac monitor/bp cuff

## 2019-01-31 DIAGNOSIS — F333 Major depressive disorder, recurrent, severe with psychotic symptoms: Secondary | ICD-10-CM | POA: Diagnosis not present

## 2019-01-31 LAB — CBG MONITORING, ED
Glucose-Capillary: 101 mg/dL — ABNORMAL HIGH (ref 70–99)
Glucose-Capillary: 175 mg/dL — ABNORMAL HIGH (ref 70–99)
Glucose-Capillary: 171 mg/dL — ABNORMAL HIGH (ref 70–99)
Glucose-Capillary: 176 mg/dL — ABNORMAL HIGH (ref 70–99)

## 2019-02-01 DIAGNOSIS — F333 Major depressive disorder, recurrent, severe with psychotic symptoms: Secondary | ICD-10-CM | POA: Diagnosis not present

## 2019-02-01 LAB — CBG MONITORING, ED: Glucose-Capillary: 136 mg/dL — ABNORMAL HIGH (ref 70–99)

## 2019-02-01 MED ORDER — INSULIN DEGLUDEC 100 UNIT/ML ~~LOC~~ SOPN: 30.0000 [IU] | PEN_INJECTOR | Freq: Every day | SUBCUTANEOUS | 0 refills | Status: DC

## 2019-02-01 MED ORDER — ASPIRIN EC 325 MG PO TBEC: 325.0000 mg | DELAYED_RELEASE_TABLET | Freq: Every day | ORAL | 0 refills | Status: AC

## 2019-02-01 MED ORDER — ATORVASTATIN CALCIUM 80 MG PO TABS: 80.0000 mg | ORAL_TABLET | Freq: Every day | ORAL | 0 refills | Status: AC

## 2019-02-01 MED ORDER — AMLODIPINE BESYLATE 10 MG PO TABS: 10.0000 mg | ORAL_TABLET | Freq: Every day | ORAL | 0 refills | Status: DC

## 2019-02-01 MED ORDER — INSULIN ASPART 100 UNIT/ML ~~LOC~~ SOLN: 0.0000 [IU] | Freq: Three times a day (TID) | SUBCUTANEOUS | 1 refills | Status: DC

## 2019-02-01 MED ORDER — LORAZEPAM 0.5 MG PO TABS: 0.5000 mg | ORAL_TABLET | Freq: Three times a day (TID) | ORAL | 0 refills | Status: DC

## 2019-02-01 MED ORDER — VITAMIN D (ERGOCALCIFEROL) 1.25 MG (50000 UNIT) PO CAPS: 50000.0000 [IU] | ORAL_CAPSULE | ORAL | 0 refills | Status: DC

## 2019-02-01 MED ORDER — FUROSEMIDE 40 MG PO TABS: 40.0000 mg | ORAL_TABLET | Freq: Two times a day (BID) | ORAL | 1 refills | Status: DC

## 2019-02-01 MED ORDER — NITROGLYCERIN 0.4 MG SL SUBL: 0.4000 mg | SUBLINGUAL_TABLET | SUBLINGUAL | 0 refills | Status: AC | PRN

## 2019-02-01 MED ORDER — INFLUENZA VAC SPLIT HIGH-DOSE 0.5 ML IM SUSY
0.5000 mL | PREFILLED_SYRINGE | Freq: Once | INTRAMUSCULAR | Status: AC
  Administered 2019-02-01: 0.5 mL via INTRAMUSCULAR
  Filled 2019-02-01: qty 0.5

## 2019-02-01 MED ORDER — PIMOZIDE 1 MG PO TABS: 1.0000 mg | ORAL_TABLET | Freq: Two times a day (BID) | ORAL | 0 refills | Status: AC

## 2019-02-01 MED ORDER — EZETIMIBE 10 MG PO TABS: 10.0000 mg | ORAL_TABLET | Freq: Every day | ORAL | 0 refills | Status: DC

## 2019-02-01 MED ORDER — INSULIN LISPRO 100 UNIT/ML ~~LOC~~ SOLN: 6.0000 [IU] | Freq: Three times a day (TID) | SUBCUTANEOUS | 0 refills | Status: DC

## 2019-02-01 MED ORDER — TAMSULOSIN HCL 0.4 MG PO CAPS: 0.4000 mg | ORAL_CAPSULE | Freq: Every day | ORAL | 0 refills | Status: AC

## 2019-02-01 MED ORDER — GABAPENTIN 300 MG PO CAPS: 300.0000 mg | ORAL_CAPSULE | Freq: Three times a day (TID) | ORAL | 0 refills | Status: AC

## 2019-02-01 MED ORDER — CLOPIDOGREL BISULFATE 75 MG PO TABS: 75.0000 mg | ORAL_TABLET | Freq: Every day | ORAL | 0 refills | Status: AC

## 2019-02-01 MED ORDER — METFORMIN HCL 1000 MG PO TABS: 1000.0000 mg | ORAL_TABLET | Freq: Two times a day (BID) | ORAL | 0 refills | Status: AC

## 2019-02-01 MED ORDER — GLIPIZIDE 5 MG PO TABS: 5.0000 mg | ORAL_TABLET | Freq: Every day | ORAL | 0 refills | Status: DC

## 2019-02-01 MED ORDER — DIVALPROEX SODIUM ER 500 MG PO TB24: 1000.0000 mg | ORAL_TABLET | Freq: Every day | ORAL | 0 refills | Status: DC

## 2019-02-01 MED ORDER — ISOSORBIDE MONONITRATE ER 60 MG PO TB24: 60.0000 mg | ORAL_TABLET | Freq: Every day | ORAL | 0 refills | Status: DC

## 2019-02-01 MED ORDER — RISPERIDONE 1 MG PO TABS: 1.5000 mg | ORAL_TABLET | Freq: Two times a day (BID) | ORAL | 0 refills | Status: DC

## 2019-02-01 MED ORDER — CARVEDILOL 6.25 MG PO TABS: 6.2500 mg | ORAL_TABLET | Freq: Two times a day (BID) | ORAL | 0 refills | Status: DC

## 2019-02-01 MED ORDER — BENZTROPINE MESYLATE 0.5 MG PO TABS: 0.5000 mg | ORAL_TABLET | Freq: Two times a day (BID) | ORAL | 0 refills | Status: DC

## 2019-02-01 MED ORDER — POTASSIUM CHLORIDE ER 20 MEQ PO TBCR: 20.0000 meq | EXTENDED_RELEASE_TABLET | Freq: Two times a day (BID) | ORAL | 0 refills | Status: DC

## 2019-02-01 MED ORDER — METOLAZONE 2.5 MG PO TABS: 2.5000 mg | ORAL_TABLET | Freq: Every day | ORAL | 0 refills | Status: DC

## 2019-02-01 NOTE — Clinical Social Work Note (Addendum)
Patient's guardian, Mr. Lorayne Marek, will meet [employer] Transport at 11:15 to transport patient to placement. ED staff made aware as well as the fact that patient will need 30 days of prescriptions.  LCSW signing off.   Chameka Mcmullen, Juleen China, LCSW

## 2019-02-01 NOTE — [place] FL2 (Signed)
[place] MEDICAID FL2 LEVEL OF CARE SCREENING TOOL     IDENTIFICATION  Patient Name: Jason [name] Birthdate: 1951/11/04 Sex: male Admission Date (Current Location): 01/18/2019  [place] and [place] Number:  Aaron Edelman [id number] T Facility and Address:  [facility],  [address] [place] [address]      Provider Number: [id number]  Attending Physician Name and Address:  Default, Provider, MD  Relative Name and Phone Number:       Current Level of Care: Other (Comment) Recommended Level of Care: Assisted Living Facility Prior Approval Number:    Date Approved/Denied:   PASRR Number:    Discharge Plan: Domiciliary (Rest home)(ALF)    Current Diagnoses: Patient Active Problem List   Diagnosis Date Noted  . Community acquired pneumonia 10/14/2018  . Dizziness 08/25/2018  . Hypokalemia due to inadequate potassium intake 08/16/2018  . Hypertension 08/07/2018  . Malingering 08/07/2018  . Intellectual disability 08/07/2018  . Paranoid schizophrenia (HCC) 07/23/2018  . Diabetes mellitus without complication (HCC) 07/23/2018  . Tourette disorder 07/23/2018    Orientation RESPIRATION BLADDER Height & Weight     Self, Situation, Place  Normal Continent Weight: 290 lb (131.5 kg) Height:  6' (182.9 cm)  BEHAVIORAL SYMPTOMS/MOOD NEUROLOGICAL BOWEL NUTRITION STATUS      Continent Diet(Carb Modified)  AMBULATORY STATUS COMMUNICATION OF NEEDS Skin   Limited Assist Verbally Other (Comment)(Intact blister to left lower leg; multiple abrasions to left upper leg with yellow drainage)                       Personal Care Assistance Level of Assistance  Bathing, Feeding, Dressing Bathing Assistance: Limited assistance Feeding assistance: Independent(Set up assistance) Dressing Assistance: Limited assistance     Functional Limitations Info  Sight, Hearing, Speech Sight Info: Adequate Hearing Info: Impaired(Hard of hearing) Speech Info: Adequate     SPECIAL CARE FACTORS FREQUENCY                       Contractures Contractures Info: Not present    Additional Factors Info  Insulin Sliding Scale Code Status Info: Full Code Allergies Info: Keflex Psychotropic Info: Cogentin, Depakote ER, Ativan, Risperdal         Current Medications (02/01/2019):  This is the current hospital active medication list Current Facility-Administered Medications  Medication Dose Route Frequency Provider Last Rate Last Dose  . amLODipine (NORVASC) tablet 10 mg  10 mg Oral Daily Gilda Crease, MD   10 mg at 02/01/19 0923  . aspirin EC tablet 325 mg  325 mg Oral Daily Gilda Crease, MD   325 mg at 02/01/19 5427  . atorvastatin (LIPITOR) tablet 80 mg  80 mg Oral QHS Gilda Crease, MD   80 mg at 01/31/19 2204  . benztropine (COGENTIN) tablet 0.5 mg  0.5 mg Oral BID Gilda Crease, MD   0.5 mg at 02/01/19 0623  . carvedilol (COREG) tablet 6.25 mg  6.25 mg Oral BID Gilda Crease, MD   6.25 mg at 02/01/19 7628  . clopidogrel (PLAVIX) tablet 75 mg  75 mg Oral Daily Gilda Crease, MD   75 mg at 02/01/19 3151  . divalproex (DEPAKOTE ER) 24 hr tablet 1,000 mg  1,000 mg Oral QAC supper Gilda Crease, MD   1,000 mg at 01/31/19 1834  . ezetimibe (ZETIA) tablet 10 mg  10 mg Oral Daily Gilda Crease, MD   10 mg at 02/01/19 7616  .  furosemide (LASIX) tablet 40 mg  40 mg Oral BID Gilda Crease, MD   40 mg at 02/01/19 9767  . gabapentin (NEURONTIN) capsule 300 mg  300 mg Oral TID Gilda Crease, MD   300 mg at 02/01/19 3419  . glipiZIDE (GLUCOTROL) tablet 5 mg  5 mg Oral Q breakfast Pollina, Canary Brim, MD   5 mg at 02/01/19 3790  . Influenza vac split quadrivalent PF (FLUZONE HIGH-DOSE) injection 0.5 mL  0.5 mL Intramuscular Once Blane Ohara, MD      . insulin aspart (novoLOG) injection 0-20 Units  0-20 Units Subcutaneous TID WC Gilda Crease, MD   3 Units at  02/01/19 0920  . insulin aspart (novoLOG) injection 6 Units  6 Units Subcutaneous TID WC Bethann Berkshire, MD   6 Units at 02/01/19 0920  . insulin glargine (LANTUS) injection 30 Units  30 Units Subcutaneous QHS Samuel Jester, DO   30 Units at 01/31/19 2207  . isosorbide mononitrate (IMDUR) 24 hr tablet 60 mg  60 mg Oral Daily Gilda Crease, MD   60 mg at 02/01/19 2409  . LORazepam (ATIVAN) tablet 0.5 mg  0.5 mg Oral TID Gilda Crease, MD   0.5 mg at 02/01/19 7353  . metFORMIN (GLUCOPHAGE) tablet 1,000 mg  1,000 mg Oral BID Gilda Crease, MD   1,000 mg at 02/01/19 2992  . metolazone (ZAROXOLYN) tablet 2.5 mg  2.5 mg Oral Daily Gilda Crease, MD   2.5 mg at 02/01/19 4268  . OLANZapine zydis (ZYPREXA) disintegrating tablet 10 mg  10 mg Oral Q8H PRN Gilda Crease, MD   10 mg at 01/25/19 2241   And  . ziprasidone (GEODON) injection 20 mg  20 mg Intramuscular PRN Gilda Crease, MD      . Pimozide TABS 1 mg  1 mg Oral BID Gilda Crease, MD   Stopped at 01/20/19 3419  . potassium chloride SA (K-DUR,KLOR-CON) CR tablet 20 mEq  20 mEq Oral BID Mesner, Barbara Cower, MD   20 mEq at 01/31/19 2204  . risperiDONE (RISPERDAL) tablet 1.5 mg  1.5 mg Oral BID Gilda Crease, MD   1.5 mg at 02/01/19 6222  . tamsulosin (FLOMAX) capsule 0.4 mg  0.4 mg Oral Daily Gilda Crease, MD   0.4 mg at 02/01/19 9798   Current Outpatient Medications  Medication Sig Dispense Refill  . acetaminophen (TYLENOL) 500 MG tablet Take 500 mg by mouth every 4 (four) hours as needed for mild pain or fever.     Marland Kitchen amLODipine (NORVASC) 10 MG tablet Take 1 tablet (10 mg total) by mouth daily. 30 tablet 0  . aspirin EC 325 MG tablet Take 1 tablet (325 mg total) by mouth daily. 30 tablet 0  . atorvastatin (LIPITOR) 80 MG tablet Take 1 tablet (80 mg total) by mouth at bedtime. 30 tablet 0  . benztropine (COGENTIN) 0.5 MG tablet Take 1 tablet (0.5 mg total) by mouth 2  (two) times daily. 60 tablet 0  . carvedilol (COREG) 6.25 MG tablet Take 1 tablet (6.25 mg total) by mouth 2 (two) times daily. 60 tablet 0  . clopidogrel (PLAVIX) 75 MG tablet Take 1 tablet (75 mg total) by mouth daily. 30 tablet 0  . divalproex (DEPAKOTE ER) 500 MG 24 hr tablet Take 2 tablets (1,000 mg total) by mouth daily before supper. At 1600 60 tablet 0  . ezetimibe (ZETIA) 10 MG tablet Take 1 tablet (10 mg total) by mouth  daily. 30 tablet 0  . furosemide (LASIX) 40 MG tablet Take 1 tablet (40 mg total) by mouth 2 (two) times daily. 60 tablet 1  . gabapentin (NEURONTIN) 300 MG capsule Take 1 capsule (300 mg total) by mouth 3 (three) times daily. 90 capsule 0  . glipiZIDE (GLUCOTROL) 5 MG tablet Take 1 tablet (5 mg total) by mouth daily. 30 tablet 0  . insulin aspart (NOVOLOG) 100 UNIT/ML injection Inject 0-20 Units into the skin 3 (three) times daily with meals. Question Answer Comment Correction coverage: Resistant (obese, steroids)  CBG < 70: implement hypoglycemia protocol  CBG 70 - 120: 0 units  CBG 121 - 150: 3 units  CBG 151 - 200: 4 units  CBG 201 - 250: 7 units  CBG 251 - 300: 11 units  CBG 301 - 350: 15 units  CBG 351 - 400: 20 units  CBG > 400 call MD 10 mL 1  . insulin degludec (TRESIBA) 100 UNIT/ML SOPN FlexTouch Pen Inject 0.3 mLs (30 Units total) into the skin at bedtime. 9 mL 0  . insulin lispro (HUMALOG) 100 UNIT/ML injection Inject 0.06 mLs (6 Units total) into the skin 3 (three) times daily with meals. 6 mL 0  . isosorbide mononitrate (IMDUR) 60 MG 24 hr tablet Take 1 tablet (60 mg total) by mouth daily. 30 tablet 0  . LORazepam (ATIVAN) 0.5 MG tablet Take 1 tablet (0.5 mg total) by mouth 3 (three) times daily. 90 tablet 0  . metFORMIN (GLUCOPHAGE) 1000 MG tablet Take 1 tablet (1,000 mg total) by mouth 2 (two) times daily. 30 tablet 0  . metolazone (ZAROXOLYN) 2.5 MG tablet Take 1 tablet (2.5 mg total) by mouth daily. 30 tablet 0  . nitroGLYCERIN (NITROSTAT) 0.4 MG  SL tablet Place 1 tablet (0.4 mg total) under the tongue every 5 (five) minutes as needed for chest pain. 10 tablet 0  . Pimozide 1 MG TABS Take 1 tablet (1 mg total) by mouth 2 (two) times daily. 60 tablet 0  . Potassium Chloride ER 20 MEQ TBCR Take 20 mEq by mouth 2 (two) times daily. 60 tablet 0  . risperiDONE (RISPERDAL) 1 MG tablet Take 1.5 tablets (1.5 mg total) by mouth 2 (two) times daily. 90 tablet 0  . tamsulosin (FLOMAX) 0.4 MG CAPS capsule Take 1 capsule (0.4 mg total) by mouth daily. 30 capsule 0  . [START ON 02/05/2019] Vitamin D, Ergocalciferol, (DRISDOL) 1.25 MG (50000 UT) CAPS capsule Take 1 capsule (50,000 Units total) by mouth every Friday. 30 capsule 0     Discharge Medications: Please see discharge summary for a list of discharge medications.  Relevant Imaging Results:  Relevant Lab Results:   Additional Information SSN [id number]. TB shot given on 01/27/2019 and read as negative on 01/29/2019. Flu Shot given on 02/01/2019.   Glynna Failla, Juleen China, LCSW

## 2019-02-01 NOTE — ED Notes (Signed)
Pt given flu shot in right deltoid.

## 2019-02-01 NOTE — ED Notes (Signed)
Went to give pt insulin, guardian asked to not give as he will not be stopping for lunch and does not want his sugar to drop on the way to [place].

## 2019-02-01 NOTE — ED Provider Notes (Signed)
Patient has outpatient arrangements set this morning.  Patient currently stable on multiple medications.  Social worker/behavioral health requested 1 month of medications prescriptions.  I reviewed his medications and printed one prescription for each.  Patient will need close follow-up with a primary doctor to review and to reassess.  Kenton Kingfisher, MD 02/01/19 1045

## 2021-02-25 ENCOUNTER — Other Ambulatory Visit: Payer: Self-pay

## 2021-02-25 ENCOUNTER — Emergency Department (HOSPITAL_COMMUNITY)
Admission: EM | Admit: 2021-02-25 | Discharge: 2021-02-26 | Disposition: A | Discharge status: Home / Self Care | Payer: Medicare Other | Attending: Emergency Medicine | Admitting: Emergency Medicine

## 2021-02-25 ENCOUNTER — Encounter (HOSPITAL_COMMUNITY): Payer: Self-pay | Admitting: Emergency Medicine

## 2021-02-25 DIAGNOSIS — R739 Hyperglycemia, unspecified: Secondary | ICD-10-CM

## 2021-02-25 DIAGNOSIS — Z794 Long term (current) use of insulin: Secondary | ICD-10-CM | POA: Insufficient documentation

## 2021-02-25 DIAGNOSIS — E1165 Type 2 diabetes mellitus with hyperglycemia: Secondary | ICD-10-CM | POA: Insufficient documentation

## 2021-02-25 DIAGNOSIS — I10 Essential (primary) hypertension: Secondary | ICD-10-CM | POA: Diagnosis not present

## 2021-02-25 DIAGNOSIS — Z951 Presence of aortocoronary bypass graft: Secondary | ICD-10-CM | POA: Diagnosis not present

## 2021-02-25 DIAGNOSIS — Z79899 Other long term (current) drug therapy: Secondary | ICD-10-CM | POA: Diagnosis not present

## 2021-02-25 DIAGNOSIS — Z7902 Long term (current) use of antithrombotics/antiplatelets: Secondary | ICD-10-CM | POA: Insufficient documentation

## 2021-02-25 DIAGNOSIS — Z7982 Long term (current) use of aspirin: Secondary | ICD-10-CM | POA: Insufficient documentation

## 2021-02-25 DIAGNOSIS — Z7984 Long term (current) use of oral hypoglycemic drugs: Secondary | ICD-10-CM | POA: Insufficient documentation

## 2021-02-25 LAB — BASIC METABOLIC PANEL
Anion gap: 11 (ref 5–15)
BUN: 19 mg/dL (ref 8–23)
CO2: 27 mmol/L (ref 22–32)
Calcium: 8.7 mg/dL — ABNORMAL LOW (ref 8.9–10.3)
Chloride: 96 mmol/L — ABNORMAL LOW (ref 98–111)
Creatinine, Ser: 1.17 mg/dL (ref 0.61–1.24)
GFR, Estimated: >60 mL/min (ref >60)
Glucose, Bld: 303 mg/dL — ABNORMAL HIGH (ref 70–99)
Potassium: 3.7 mmol/L (ref 3.5–5.1)
Sodium: 134 mmol/L — ABNORMAL LOW (ref 135–145)

## 2021-02-25 LAB — BLOOD GAS, VENOUS
Acid-Base Excess: 3.4 mmol/L — ABNORMAL HIGH (ref 0.0–2.0)
Bicarbonate: 27.1 mmol/L (ref 20.0–28.0)
FIO2: 21
O2 Saturation: 89 %
Patient temperature: 36.7
pCO2, Ven: 43.3 mmHg — ABNORMAL LOW (ref 44.0–60.0)
pH, Ven: 7.42 (ref 7.250–7.430)
pO2, Ven: 57 mmHg — ABNORMAL HIGH (ref 32.0–45.0)

## 2021-02-25 LAB — URINALYSIS, ROUTINE W REFLEX MICROSCOPIC
Bilirubin Urine: NEGATIVE
Glucose, UA: >=500 mg/dL — AB
Ketones, ur: NEGATIVE mg/dL
Leukocytes,Ua: NEGATIVE
Nitrite: NEGATIVE
Specific Gravity, Urine: 1.01 (ref 1.005–1.030)
pH: 5.5 (ref 5.0–8.0)

## 2021-02-25 LAB — CBC
HCT: 38.4 % — ABNORMAL LOW (ref 39.0–52.0)
Hemoglobin: 12.2 g/dL — ABNORMAL LOW (ref 13.0–17.0)
MCH: 25.6 pg — ABNORMAL LOW (ref 26.0–34.0)
MCHC: 31.8 g/dL (ref 30.0–36.0)
MCV: 80.7 fL (ref 80.0–100.0)
Platelets: 229 10*3/uL (ref 150–400)
RBC: 4.76 MIL/uL (ref 4.22–5.81)
RDW: 15.9 % — ABNORMAL HIGH (ref 11.5–15.5)
WBC: 8.8 10*3/uL (ref 4.0–10.5)
nRBC: 0 % (ref 0.0–0.2)

## 2021-02-25 LAB — URINALYSIS, MICROSCOPIC (REFLEX): Bacteria, UA: NONE SEEN

## 2021-02-25 LAB — CBG MONITORING, ED: Glucose-Capillary: 384 mg/dL — ABNORMAL HIGH (ref 70–99)

## 2021-02-25 MED ORDER — SODIUM CHLORIDE 0.9 % IV BOLUS
1000.0000 mL | Freq: Once | INTRAVENOUS | Status: AC
  Administered 2021-02-25: 1000 mL via INTRAVENOUS

## 2021-02-25 NOTE — ED Notes (Signed)
Pt refusing blood work, requesting to go home, md notified.

## 2021-02-25 NOTE — ED Triage Notes (Signed)
Pt arrived by [facility] from [facility] group home for hyperglycemia. Ems states cbg enroute was 425. Group home states cbg was 450 and was given 2 units of humalog

## 2021-02-26 ENCOUNTER — Encounter (HOSPITAL_COMMUNITY): Payer: Self-pay

## 2021-02-26 DIAGNOSIS — I252 Old myocardial infarction: Secondary | ICD-10-CM | POA: Insufficient documentation

## 2021-02-26 DIAGNOSIS — E1165 Type 2 diabetes mellitus with hyperglycemia: Secondary | ICD-10-CM | POA: Diagnosis not present

## 2021-02-26 DIAGNOSIS — Z7409 Other reduced mobility: Secondary | ICD-10-CM | POA: Insufficient documentation

## 2021-02-26 NOTE — ED Notes (Signed)
This RN spoke with Jason [name], Legal Guardian regarding discharge and transportation home. This RN explained pt has been medically cleared and needs transportation home. [name] stated, " he cant be discharged without speaking to me, his legal guardian and if he got back to [facility] and he was lethargic, then we are sending him right back".   RN made [facility] aware again of ED recommendations for discharge.  Jason [name] said she will get in touch with [facility] and arrange transportation.

## 2021-02-26 NOTE — ED Notes (Signed)
Pt refused v/s..

## 2021-02-26 NOTE — ED Notes (Signed)
Pt called nurse to room asking " when can I go home, can the sheriffs dept come pick me up and take me home." pt upright sitting in chair in no distress. This nurse told pt we are trying to contact [facility] to pick him up and as soon as I get in touch with them I will notify him.

## 2021-02-26 NOTE — ED Notes (Signed)
Made numerous attempts to contact [facility], unable to contact the facility. Requested [place] [employer] to go to home and attempt to get someone to call ED, officer reports that they went to both of the [facility] and was unable to get someone to come to the door. Charge Nurse notified.

## 2021-02-26 NOTE — ED Notes (Signed)
Pt walked up to nursing desk. Pt asking" ma'am did you call me a ride, im ready to go home." RN stated to pt that we have spoken to [facility], legal guardian and she is going to call [facility] to arrange pick up.

## 2021-02-26 NOTE — Discharge Instructions (Signed)
Continue to monitor your blood sugar at home.

## 2021-02-26 NOTE — ED Notes (Signed)
Spoke with director of [facility], stating she wants and update on pt. notifed her that night shift nurse has attempted several time to contact them about picking up pt because he was d/c. Director states he wasn't sent up here for hyperglycemia he was sent over for TIAs, and that he was just d/c from [facility] for same. I notified director he isnt having any neuro deficits at this time. Director states he needs to be kept and checked out and they they will get a doctors order to keep him and they are going to notify Maxine Glenn the legal guardian to call and speak with this nurse.

## 2021-02-26 NOTE — ED Provider Notes (Signed)
[facility] EMERGENCY DEPARTMENT Provider Note   CSN: [id number] Arrival date & time: 02/25/21  1841   History Chief Complaint  Patient presents with  . Hyperglycemia    Jason [name] is a 70 y.o. male.  The history is provided by the EMS personnel. The history is limited by the condition of the patient (Psychiatric disorder).  Hyperglycemia He has history of hypertension, diabetes, stroke, paranoid schizophrenia and was sent here from a group home because of elevated blood sugar.  Glucose is was reported to be 450 at the group home, 425 in the ambulance coming to the ED.  He reportedly had been given 2 units of Humalog.  Patient has no complaints.  Past Medical History:  Diagnosis Date  . Diabetes mellitus without complication (HCC)   . GERD (gastroesophageal reflux disease)   . Hypertension   . Paranoid schizophrenia (HCC)   . Stroke (HCC)   . Tourette disorder     Patient Active Problem List   Diagnosis Date Noted  . Community acquired pneumonia 10/14/2018  . Dizziness 08/25/2018  . Hypokalemia due to inadequate potassium intake 08/16/2018  . Hypertension 08/07/2018  . Malingering 08/07/2018  . Intellectual disability 08/07/2018  . Paranoid schizophrenia (HCC) 07/23/2018  . Diabetes mellitus without complication (HCC) 07/23/2018  . Tourette disorder 07/23/2018    Past Surgical History:  Procedure Laterality Date  . CARDIAC SURGERY    . CORONARY ARTERY BYPASS GRAFT    . HAND SURGERY    . TONSILLECTOMY         History reviewed. No pertinent family history.  Social History   Tobacco Use  . Smoking status: Never Smoker  . Smokeless tobacco: Never Used  Vaping Use  . Vaping Use: Never used  Substance Use Topics  . Alcohol use: Not Currently  . Drug use: Not Currently    Home Medications Prior to Admission medications   Medication Sig Start Date End Date Taking? Authorizing Provider  acetaminophen (TYLENOL) 500 MG tablet Take 500 mg by mouth every  4 (four) hours as needed for mild pain or fever.     [provider]  amLODipine (NORVASC) 10 MG tablet Take 1 tablet (10 mg total) by mouth daily. 02/01/19   Blane Ohara, MD  aspirin EC 325 MG tablet Take 1 tablet (325 mg total) by mouth daily. 02/01/19   Blane Ohara, MD  atorvastatin (LIPITOR) 80 MG tablet Take 1 tablet (80 mg total) by mouth at bedtime. 02/01/19   Blane Ohara, MD  benztropine (COGENTIN) 0.5 MG tablet Take 1 tablet (0.5 mg total) by mouth 2 (two) times daily. 02/01/19   Blane Ohara, MD  carvedilol (COREG) 6.25 MG tablet Take 1 tablet (6.25 mg total) by mouth 2 (two) times daily. 02/01/19   Blane Ohara, MD  clopidogrel (PLAVIX) 75 MG tablet Take 1 tablet (75 mg total) by mouth daily. 02/01/19   Blane Ohara, MD  divalproex (DEPAKOTE ER) 500 MG 24 hr tablet Take 2 tablets (1,000 mg total) by mouth daily before supper. At 1600 02/01/19   Blane Ohara, MD  ezetimibe (ZETIA) 10 MG tablet Take 1 tablet (10 mg total) by mouth daily. 02/01/19   Blane Ohara, MD  furosemide (LASIX) 40 MG tablet Take 1 tablet (40 mg total) by mouth 2 (two) times daily. 02/01/19   Blane Ohara, MD  gabapentin (NEURONTIN) 300 MG capsule Take 1 capsule (300 mg total) by mouth 3 (three) times daily. 02/01/19   Blane Ohara, MD  glipiZIDE (GLUCOTROL) 5  MG tablet Take 1 tablet (5 mg total) by mouth daily. 02/01/19   Blane Ohara, MD  insulin aspart (NOVOLOG) 100 UNIT/ML injection Inject 0-20 Units into the skin 3 (three) times daily with meals. Question Answer Comment Correction coverage: Resistant (obese, steroids)  CBG < 70: implement hypoglycemia protocol  CBG 70 - 120: 0 units  CBG 121 - 150: 3 units  CBG 151 - 200: 4 units  CBG 201 - 250: 7 units  CBG 251 - 300: 11 units  CBG 301 - 350: 15 units  CBG 351 - 400: 20 units  CBG > 400 call MD 02/01/19   Blane Ohara, MD  insulin degludec (TRESIBA) 100 UNIT/ML SOPN FlexTouch Pen Inject 0.3 mLs (30 Units total) into the skin at  bedtime. 02/01/19   Blane Ohara, MD  insulin lispro (HUMALOG) 100 UNIT/ML injection Inject 0.06 mLs (6 Units total) into the skin 3 (three) times daily with meals. 02/01/19   Blane Ohara, MD  isosorbide mononitrate (IMDUR) 60 MG 24 hr tablet Take 1 tablet (60 mg total) by mouth daily. 02/01/19   Blane Ohara, MD  LORazepam (ATIVAN) 0.5 MG tablet Take 1 tablet (0.5 mg total) by mouth 3 (three) times daily. 02/01/19   Blane Ohara, MD  metFORMIN (GLUCOPHAGE) 1000 MG tablet Take 1 tablet (1,000 mg total) by mouth 2 (two) times daily. 02/01/19   Blane Ohara, MD  metolazone (ZAROXOLYN) 2.5 MG tablet Take 1 tablet (2.5 mg total) by mouth daily. 02/01/19   Blane Ohara, MD  nitroGLYCERIN (NITROSTAT) 0.4 MG SL tablet Place 1 tablet (0.4 mg total) under the tongue every 5 (five) minutes as needed for chest pain. 02/01/19   Blane Ohara, MD  Pimozide 1 MG TABS Take 1 tablet (1 mg total) by mouth 2 (two) times daily. 02/01/19   Blane Ohara, MD  Potassium Chloride ER 20 MEQ TBCR Take 20 mEq by mouth 2 (two) times daily. 02/01/19   Blane Ohara, MD  risperiDONE (RISPERDAL) 1 MG tablet Take 1.5 tablets (1.5 mg total) by mouth 2 (two) times daily. 02/01/19   Blane Ohara, MD  tamsulosin (FLOMAX) 0.4 MG CAPS capsule Take 1 capsule (0.4 mg total) by mouth daily. 02/01/19   Blane Ohara, MD  Vitamin D, Ergocalciferol, (DRISDOL) 1.25 MG (50000 UT) CAPS capsule Take 1 capsule (50,000 Units total) by mouth every Friday. 02/05/19   Blane Ohara, MD    Allergies    Keflex [cephalexin]  Review of Systems   Review of Systems  Unable to perform ROS: Psychiatric disorder    Physical Exam Updated Vital Signs BP 138/78   Pulse 60   Temp 97.9 F (36.6 C) (Oral)   Resp 16   Ht 6' (1.829 m)   Wt 136.1 kg   SpO2 99%   BMI 40.69 kg/m   Physical Exam Vitals and nursing note reviewed.   70 year old male, resting comfortably and in no acute distress. Vital signs are normal. Oxygen saturation is 99%,  which is normal. Head is normocephalic and atraumatic. PERRLA, EOMI. Oropharynx is clear. Neck is nontender and supple without adenopathy or JVD. Back is nontender and there is no CVA tenderness. Lungs are clear without rales, wheezes, or rhonchi. Chest is nontender. Heart has regular rate and rhythm without murmur. Abdomen is soft, flat, nontender without masses or hepatosplenomegaly and peristalsis is normoactive. Extremities have no cyanosis or edema, full range of motion is present. Skin is warm and dry without rash. Neurologic: Awake and alert, able to answer  simple questions, no focal motor deficits.  ED Results / Procedures / Treatments   Labs (all labs ordered are listed, but only abnormal results are displayed) Labs Reviewed  BASIC METABOLIC PANEL - Abnormal; Notable for the following components:      Result Value   Sodium 134 (*)    Chloride 96 (*)    Glucose, Bld 303 (*)    Calcium 8.7 (*)    All other components within normal limits  CBC - Abnormal; Notable for the following components:   Hemoglobin 12.2 (*)    HCT 38.4 (*)    MCH 25.6 (*)    RDW 15.9 (*)    All other components within normal limits  URINALYSIS, ROUTINE W REFLEX MICROSCOPIC - Abnormal; Notable for the following components:   Glucose, UA >=500 (*)    Hgb urine dipstick TRACE (*)    Protein, ur TRACE (*)    All other components within normal limits  BLOOD GAS, VENOUS - Abnormal; Notable for the following components:   pCO2, Ven 43.3 (*)    pO2, Ven 57.0 (*)    Acid-Base Excess 3.4 (*)    All other components within normal limits  CBG MONITORING, ED - Abnormal; Notable for the following components:   Glucose-Capillary 384 (*)    All other components within normal limits  URINALYSIS, MICROSCOPIC (REFLEX)   Procedures Procedures   Medications Ordered in ED Medications  sodium chloride 0.9 % bolus 1,000 mL (1,000 mLs Intravenous New Bag/Given 02/25/21 2146)    ED Course  I have reviewed the  triage vital signs and the nursing notes.  Pertinent lab results that were available during my care of the patient were reviewed by me and considered in my medical decision making (see chart for details).  MDM Rules/Calculators/A&P Hyperglycemia at group home.  Glucose here had apparently responded to the insulin he had received prior to arrival.  CBG on arrival was 384, when metabolic panel was obtained, glucose was down to 303.  Urinalysis showed no evidence of ketones.  Venous blood gas showed normal pH.  No need for further treatment, patient is returned to the group home to continue routine glucose monitoring.  Old records were reviewed, and he has no prior ED visits or hospitalizations related to hyperglycemia.  Final Clinical Impression(s) / ED Diagnoses Final diagnoses:  Hyperglycemia    Rx / DC Orders ED Discharge Orders    None       Dione Booze, MD 02/26/21 0010

## 2021-02-26 NOTE — ED Notes (Signed)
Made numerous attempts to contact [facility] and contact numbers listed in pt chart, unable to get in contact with anyone regarding pt.

## 2021-03-19 DIAGNOSIS — R159 Full incontinence of feces: Secondary | ICD-10-CM | POA: Diagnosis not present

## 2021-03-19 DIAGNOSIS — I6789 Other cerebrovascular disease: Secondary | ICD-10-CM | POA: Diagnosis not present

## 2021-03-19 DIAGNOSIS — E119 Type 2 diabetes mellitus without complications: Secondary | ICD-10-CM | POA: Diagnosis not present

## 2021-03-19 DIAGNOSIS — R32 Unspecified urinary incontinence: Secondary | ICD-10-CM | POA: Diagnosis not present

## 2021-03-19 DIAGNOSIS — Z794 Long term (current) use of insulin: Secondary | ICD-10-CM | POA: Diagnosis not present

## 2021-03-26 DIAGNOSIS — F200 Paranoid schizophrenia: Secondary | ICD-10-CM | POA: Diagnosis not present

## 2021-03-26 DIAGNOSIS — F331 Major depressive disorder, recurrent, moderate: Secondary | ICD-10-CM | POA: Diagnosis not present

## 2021-03-26 DIAGNOSIS — F4312 Post-traumatic stress disorder, chronic: Secondary | ICD-10-CM | POA: Diagnosis not present

## 2021-04-02 ENCOUNTER — Emergency Department (HOSPITAL_COMMUNITY)
Admission: EM | Admit: 2021-04-02 | Discharge: 2021-04-02 | Disposition: A | Discharge status: Home / Self Care | Payer: Medicare HMO | Attending: Emergency Medicine | Admitting: Emergency Medicine

## 2021-04-02 ENCOUNTER — Emergency Department (HOSPITAL_COMMUNITY): Payer: Medicare HMO

## 2021-04-02 ENCOUNTER — Encounter (HOSPITAL_COMMUNITY): Payer: Self-pay | Admitting: Emergency Medicine

## 2021-04-02 ENCOUNTER — Other Ambulatory Visit: Payer: Self-pay

## 2021-04-02 DIAGNOSIS — Z951 Presence of aortocoronary bypass graft: Secondary | ICD-10-CM | POA: Insufficient documentation

## 2021-04-02 DIAGNOSIS — R531 Weakness: Secondary | ICD-10-CM | POA: Diagnosis not present

## 2021-04-02 DIAGNOSIS — R4781 Slurred speech: Secondary | ICD-10-CM | POA: Diagnosis not present

## 2021-04-02 DIAGNOSIS — R29818 Other symptoms and signs involving the nervous system: Secondary | ICD-10-CM | POA: Diagnosis not present

## 2021-04-02 DIAGNOSIS — Z7982 Long term (current) use of aspirin: Secondary | ICD-10-CM | POA: Diagnosis not present

## 2021-04-02 DIAGNOSIS — R5381 Other malaise: Secondary | ICD-10-CM | POA: Diagnosis not present

## 2021-04-02 DIAGNOSIS — Z794 Long term (current) use of insulin: Secondary | ICD-10-CM | POA: Insufficient documentation

## 2021-04-02 DIAGNOSIS — E119 Type 2 diabetes mellitus without complications: Secondary | ICD-10-CM | POA: Diagnosis not present

## 2021-04-02 DIAGNOSIS — W19XXXA Unspecified fall, initial encounter: Secondary | ICD-10-CM | POA: Diagnosis not present

## 2021-04-02 DIAGNOSIS — Z79899 Other long term (current) drug therapy: Secondary | ICD-10-CM | POA: Insufficient documentation

## 2021-04-02 DIAGNOSIS — H538 Other visual disturbances: Secondary | ICD-10-CM | POA: Diagnosis not present

## 2021-04-02 DIAGNOSIS — I10 Essential (primary) hypertension: Secondary | ICD-10-CM | POA: Diagnosis not present

## 2021-04-02 DIAGNOSIS — R262 Difficulty in walking, not elsewhere classified: Secondary | ICD-10-CM | POA: Diagnosis not present

## 2021-04-02 DIAGNOSIS — Z7401 Bed confinement status: Secondary | ICD-10-CM | POA: Diagnosis not present

## 2021-04-02 DIAGNOSIS — R2990 Unspecified symptoms and signs involving the nervous system: Secondary | ICD-10-CM

## 2021-04-02 DIAGNOSIS — I639 Cerebral infarction, unspecified: Secondary | ICD-10-CM | POA: Insufficient documentation

## 2021-04-02 DIAGNOSIS — Z7984 Long term (current) use of oral hypoglycemic drugs: Secondary | ICD-10-CM | POA: Diagnosis not present

## 2021-04-02 DIAGNOSIS — R42 Dizziness and giddiness: Secondary | ICD-10-CM | POA: Diagnosis not present

## 2021-04-02 DIAGNOSIS — R0902 Hypoxemia: Secondary | ICD-10-CM | POA: Diagnosis not present

## 2021-04-02 LAB — URINALYSIS, ROUTINE W REFLEX MICROSCOPIC
Bilirubin Urine: NEGATIVE
Glucose, UA: NEGATIVE mg/dL
Hgb urine dipstick: NEGATIVE
Ketones, ur: NEGATIVE mg/dL
Leukocytes,Ua: NEGATIVE
Nitrite: NEGATIVE
Protein, ur: NEGATIVE mg/dL
Specific Gravity, Urine: 1.01 (ref 1.005–1.030)
pH: 5 (ref 5.0–8.0)

## 2021-04-02 LAB — CBC WITH DIFFERENTIAL/PLATELET
Abs Immature Granulocytes: 0.05 10*3/uL (ref 0.00–0.07)
Basophils Absolute: 0 10*3/uL (ref 0.0–0.1)
Basophils Relative: 0 %
Eosinophils Absolute: 0.3 10*3/uL (ref 0.0–0.5)
Eosinophils Relative: 4 %
HCT: 36.6 % — ABNORMAL LOW (ref 39.0–52.0)
Hemoglobin: 11.8 g/dL — ABNORMAL LOW (ref 13.0–17.0)
Immature Granulocytes: 1 %
Lymphocytes Relative: 16 %
Lymphs Abs: 1.5 10*3/uL (ref 0.7–4.0)
MCH: 26.7 pg (ref 26.0–34.0)
MCHC: 32.2 g/dL (ref 30.0–36.0)
MCV: 82.8 fL (ref 80.0–100.0)
Monocytes Absolute: 0.7 10*3/uL (ref 0.1–1.0)
Monocytes Relative: 8 %
Neutro Abs: 6.9 10*3/uL (ref 1.7–7.7)
Neutrophils Relative %: 71 %
Platelets: 218 10*3/uL (ref 150–400)
RBC: 4.42 MIL/uL (ref 4.22–5.81)
RDW: 16.3 % — ABNORMAL HIGH (ref 11.5–15.5)
WBC: 9.5 10*3/uL (ref 4.0–10.5)
nRBC: 0 % (ref 0.0–0.2)

## 2021-04-02 LAB — BASIC METABOLIC PANEL
Anion gap: 10 (ref 5–15)
BUN: 21 mg/dL (ref 8–23)
CO2: 27 mmol/L (ref 22–32)
Calcium: 9.6 mg/dL (ref 8.9–10.3)
Chloride: 101 mmol/L (ref 98–111)
Creatinine, Ser: 1.13 mg/dL (ref 0.61–1.24)
GFR, Estimated: >60 mL/min (ref >60)
Glucose, Bld: 136 mg/dL — ABNORMAL HIGH (ref 70–99)
Potassium: 3.5 mmol/L (ref 3.5–5.1)
Sodium: 138 mmol/L (ref 135–145)

## 2021-04-02 NOTE — ED Notes (Signed)
Pt given urinal for urine sample 

## 2021-04-02 NOTE — ED Notes (Signed)
After unsuccessful attempts to reach someone at [facility], [facility] was called to transport Pt back home.

## 2021-04-02 NOTE — Discharge Instructions (Addendum)
Call your primary care doctor or specialist as discussed in the next 2-3 days.   Return immediately back to the ER if:  Your symptoms worsen within the next 12-24 hours. You develop new symptoms such as new fevers, persistent vomiting, new pain, shortness of breath, or new weakness or numbness, or if you have any other concerns.  

## 2021-04-02 NOTE — ED Provider Notes (Addendum)
[facility] EMERGENCY DEPARTMENT Provider Note   CSN: [id number] Arrival date & time: 04/02/21  1414     History Chief Complaint  Patient presents with  . Transient Ischemic Attack    Jason [name] is a 70 y.o. male.  Patient with history of prior stroke and prior left-sided arm and leg weakness, presents again with increased weakness in the left side again starting this morning.  He states he got up and was walking fine but after breakfast had more difficulty walking and presents to the ER.  Denies fall or trauma.  Denies fevers cough vomiting or diarrhea.  He states his left leg feels heavier than normal.        Past Medical History:  Diagnosis Date  . Diabetes mellitus without complication (HCC)   . GERD (gastroesophageal reflux disease)   . Hypertension   . Paranoid schizophrenia (HCC)   . Stroke (HCC)   . Tourette disorder     Patient Active Problem List   Diagnosis Date Noted  . Hx of myocardial infarction 02/26/2021  . Impaired mobility 02/26/2021  . Community acquired pneumonia 10/14/2018  . Hypokalemia due to inadequate potassium intake 08/16/2018  . Malingering 08/07/2018  . Intellectual disability 08/07/2018  . Paranoid schizophrenia (HCC) 07/23/2018  . Severe episode of recurrent major depressive disorder, without psychotic features (HCC) 10/16/2017  . Postural dizziness with presyncope 10/01/2017  . Patient noncompliance 08/19/2017  . Stasis ulcer (HCC) 08/19/2017  . Venous stasis ulcer of right calf limited to breakdown of skin with varicose veins (HCC) 08/19/2017  . Benign prostatic hyperplasia without lower urinary tract symptoms 08/12/2017  . Closed fracture of anterior column of acetabulum with routine healing, left 03/28/2017  . Disorder of bone density and structure, unspecified 03/27/2017  . Hypogonadism in male 03/27/2017  . At high risk for falls 03/04/2017  . Cognitive communication deficit 03/04/2017  . Senile osteoporosis 03/04/2017   . Left arm pain 02/16/2017  . Diastolic heart failure (HCC) 02/06/2017  . HLD (hyperlipidemia) 02/06/2017  . Nondisplaced fracture of anterior wall of left acetabulum (HCC) 02/06/2017  . Syncope 02/06/2017  . Developmental delay 12/26/2016  . Obesity (BMI 30-39.9) 12/26/2016  . Cellulitis of left lower extremity 08/29/2016  . Slow transit constipation 06/24/2016  . Skin lesion of left lower limb 06/13/2016  . Episode of recurrent major depressive disorder (HCC) 06/12/2016  . Bulging of cervical intervertebral disc 12/14/2015  . Hx of CABG 12/14/2015  . OSA (obstructive sleep apnea) 12/14/2015  . Type 2 diabetes mellitus with hyperglycemia (HCC) 12/14/2015  . Elevated troponin 03/14/2015  . Positive blood culture 03/14/2015  . Chronic anticoagulation 03/13/2015  . Morbid obesity due to excess calories (HCC) 03/13/2015  . Paroxysmal atrial fibrillation (HCC) 03/13/2015  . Pleural effusion on left 03/13/2015  . Schizoaffective disorder (HCC) 03/13/2015  . SOB (shortness of breath) 03/13/2015  . Borderline personality disorder (HCC) 04/22/2014  . Anxiety 02/26/2014  . Transient ischemic attack (TIA) 02/04/2014  . Resting tremor 02/04/2014  . Stroke-like symptoms 06/24/2013  . Benign paroxysmal vertigo, unspecified ear 04/30/2013  . Chest pain, unspecified 04/12/2013  . Vitamin D deficiency, unspecified 06/03/2011  . Disease of thyroid gland 03/04/2011  . Essential hypertension 03/04/2011  . Dysphonia of [place] Tourette's syndrome 09/10/2010  . Generalized anxiety disorder 09/10/2010  . Hearing loss 09/10/2010    Past Surgical History:  Procedure Laterality Date  . CARDIAC SURGERY    . CORONARY ARTERY BYPASS GRAFT    . HAND SURGERY    .  TONSILLECTOMY         History reviewed. No pertinent family history.  Social History   Tobacco Use  . Smoking status: Never Smoker  . Smokeless tobacco: Never Used  Vaping Use  . Vaping Use: Never used  Substance Use Topics   . Alcohol use: Not Currently  . Drug use: Not Currently    Home Medications Prior to Admission medications   Medication Sig Start Date End Date Taking? Authorizing Provider  acetaminophen (TYLENOL) 500 MG tablet Take 500 mg by mouth every 4 (four) hours as needed for mild pain or fever.    Yes [provider]  albuterol (VENTOLIN HFA) 108 (90 Base) MCG/ACT inhaler Inhale into the lungs. 01/01/21  Yes [provider]  ARIPiprazole (ABILIFY) 5 MG tablet Take 5 mg by mouth daily. 02/05/21  Yes [provider]  aspirin EC 325 MG tablet Take 1 tablet (325 mg total) by mouth daily. 02/01/19  Yes Blane Ohara, MD  atorvastatin (LIPITOR) 80 MG tablet Take 1 tablet (80 mg total) by mouth at bedtime. 02/01/19  Yes Blane Ohara, MD  benztropine (COGENTIN) 0.5 MG tablet Take 1 tablet (0.5 mg total) by mouth 2 (two) times daily. 02/01/19  Yes Blane Ohara, MD  carbidopa-levodopa (SINEMET IR) 25-100 MG tablet Take 1 tablet by mouth in the morning and at bedtime. 01/31/21  Yes [provider]  carvedilol (COREG) 6.25 MG tablet Take 1 tablet (6.25 mg total) by mouth 2 (two) times daily. 02/01/19  Yes Blane Ohara, MD  clopidogrel (PLAVIX) 75 MG tablet Take 1 tablet (75 mg total) by mouth daily. 02/01/19  Yes Blane Ohara, MD  furosemide (LASIX) 40 MG tablet Take 1 tablet (40 mg total) by mouth 2 (two) times daily. Patient taking differently: Take 30 mg by mouth daily. 02/01/19  Yes Blane Ohara, MD  gabapentin (NEURONTIN) 300 MG capsule Take 1 capsule (300 mg total) by mouth 3 (three) times daily. 02/01/19  Yes Blane Ohara, MD  insulin aspart (NOVOLOG) 100 UNIT/ML injection Inject 0-20 Units into the skin 3 (three) times daily with meals. Question Answer Comment Correction coverage: Resistant (obese, steroids)  CBG < 70: implement hypoglycemia protocol  CBG 70 - 120: 0 units  CBG 121 - 150: 3 units  CBG 151 - 200: 4 units  CBG 201 - 250: 7 units  CBG 251 - 300: 11  units  CBG 301 - 350: 15 units  CBG 351 - 400: 20 units  CBG > 400 call MD Patient taking differently: Inject 0-20 Units into the skin 3 (three) times daily with meals. CBG < 70: implement hypoglycemia protocol  CBG 70 - 150: 0 units  CBG 151 - 200: 2 units  CBG 201 - 250: 4 units  CBG 251 - 300: 6 units  CBG 301 - 350: 8 units  CBG 351 - 400: 11 units  CBG > 400= 13U 02/01/19  Yes Blane Ohara, MD  Insulin Glargine (BASAGLAR KWIKPEN) 100 UNIT/ML Inject 80 Units into the skin daily. 02/05/21  Yes [provider]  isosorbide mononitrate (IMDUR) 60 MG 24 hr tablet Take 1 tablet (60 mg total) by mouth daily. 02/01/19  Yes Blane Ohara, MD  metFORMIN (GLUCOPHAGE) 1000 MG tablet Take 1 tablet (1,000 mg total) by mouth 2 (two) times daily. 02/01/19  Yes Blane Ohara, MD  omeprazole (PRILOSEC) 20 MG capsule Take 1 capsule by mouth daily. 01/31/21  Yes [provider]  Pimozide 1 MG TABS Take 1 tablet (1 mg total)  by mouth 2 (two) times daily. 02/01/19  Yes Blane Ohara, MD  tamsulosin (FLOMAX) 0.4 MG CAPS capsule Take 1 capsule (0.4 mg total) by mouth daily. 02/01/19  Yes Blane Ohara, MD  VIMPAT 50 MG TABS tablet Take 100 mg by mouth 2 (two) times daily as needed. 02/06/21  Yes [provider]  amLODipine (NORVASC) 10 MG tablet Take 1 tablet (10 mg total) by mouth daily. Patient not taking: Reported on 04/02/2021 02/01/19   Blane Ohara, MD  divalproex (DEPAKOTE ER) 500 MG 24 hr tablet Take 2 tablets (1,000 mg total) by mouth daily before supper. At 1600 Patient not taking: Reported on 04/02/2021 02/01/19   Blane Ohara, MD  ezetimibe (ZETIA) 10 MG tablet Take 1 tablet (10 mg total) by mouth daily. Patient not taking: Reported on 04/02/2021 02/01/19   Blane Ohara, MD  glipiZIDE (GLUCOTROL) 5 MG tablet Take 1 tablet (5 mg total) by mouth daily. Patient not taking: No sig reported 02/01/19   Blane Ohara, MD  insulin degludec (TRESIBA) 100 UNIT/ML SOPN FlexTouch Pen  Inject 0.3 mLs (30 Units total) into the skin at bedtime. Patient not taking: No sig reported 02/01/19   Blane Ohara, MD  insulin lispro (HUMALOG) 100 UNIT/ML injection Inject 0.06 mLs (6 Units total) into the skin 3 (three) times daily with meals. Patient not taking: No sig reported 02/01/19   Blane Ohara, MD  LORazepam (ATIVAN) 0.5 MG tablet Take 1 tablet (0.5 mg total) by mouth 3 (three) times daily. Patient not taking: Reported on 04/02/2021 02/01/19   Blane Ohara, MD  metolazone (ZAROXOLYN) 2.5 MG tablet Take 1 tablet (2.5 mg total) by mouth daily. Patient not taking: Reported on 04/02/2021 02/01/19   Blane Ohara, MD  nitroGLYCERIN (NITROSTAT) 0.4 MG SL tablet Place 1 tablet (0.4 mg total) under the tongue every 5 (five) minutes as needed for chest pain. 02/01/19   Blane Ohara, MD  Potassium Chloride ER 20 MEQ TBCR Take 20 mEq by mouth 2 (two) times daily. Patient not taking: Reported on 04/02/2021 02/01/19   Blane Ohara, MD  risperiDONE (RISPERDAL) 1 MG tablet Take 1.5 tablets (1.5 mg total) by mouth 2 (two) times daily. Patient not taking: Reported on 04/02/2021 02/01/19   Blane Ohara, MD  Vitamin D, Ergocalciferol, (DRISDOL) 1.25 MG (50000 UT) CAPS capsule Take 1 capsule (50,000 Units total) by mouth every Friday. Patient not taking: Reported on 04/02/2021 02/05/19   Blane Ohara, MD    Allergies    Keflex [cephalexin]  Review of Systems   Review of Systems  Constitutional: Negative for fever.  HENT: Negative for ear pain and sore throat.   Eyes: Negative for pain.  Respiratory: Negative for cough.   Cardiovascular: Negative for chest pain.  Gastrointestinal: Negative for abdominal pain.  Genitourinary: Negative for flank pain.  Musculoskeletal: Negative for back pain.  Skin: Negative for color change and rash.  Neurological: Negative for syncope.  All other systems reviewed and are negative.   Physical Exam Updated Vital Signs BP 116/61   Pulse 62   Temp 98.2  F (36.8 C) (Oral)   Resp 11   Ht 6' (1.829 m)   Wt 136 kg   SpO2 99%   BMI 40.66 kg/m   Physical Exam Constitutional:      General: He is not in acute distress.    Appearance: He is well-developed.  HENT:     Head: Normocephalic.     Nose: Nose normal.  Eyes:     Extraocular Movements:  Extraocular movements intact.  Cardiovascular:     Rate and Rhythm: Normal rate.  Pulmonary:     Effort: Pulmonary effort is normal.  Skin:    Coloration: Skin is not jaundiced.  Neurological:     Mental Status: He is alert.     Comments: Patient with baseline left upper and left lower extremity weakness presents with increased left upper and left lower extremity weakness.  He has a persistent left-sided facial droop unchanged from past.     ED Results / Procedures / Treatments   Labs (all labs ordered are listed, but only abnormal results are displayed) Labs Reviewed  CBC WITH DIFFERENTIAL/PLATELET - Abnormal; Notable for the following components:      Result Value   Hemoglobin 11.8 (*)    HCT 36.6 (*)    RDW 16.3 (*)    All other components within normal limits  BASIC METABOLIC PANEL - Abnormal; Notable for the following components:   Glucose, Bld 136 (*)    All other components within normal limits  URINALYSIS, ROUTINE W REFLEX MICROSCOPIC    EKG EKG Interpretation  Date/Time:  Monday Apr 02 2021 14:32:23 EDT Ventricular Rate:  67 PR Interval:  153 QRS Duration: 157 QT Interval:  448 QTC Calculation: 473 R Axis:   226 Text Interpretation: Sinus or ectopic atrial rhythm IVCD, consider atypical RBBB Anterolateral infarct, old Confirmed by Norman Clay ([id number]) on 04/02/2021 3:06:26 PM   Radiology CT Head Wo Contrast  Result Date: 04/02/2021 CLINICAL DATA:  Neuro deficit, acute, stroke suspected. Left leg weakness. Dizziness and blurred vision for 1 week. EXAM: CT HEAD WITHOUT CONTRAST TECHNIQUE: Contiguous axial images were obtained from the base of the skull through the  vertex without intravenous contrast. COMPARISON:  02/24/2021 FINDINGS: Brain: There is no evidence of an acute infarct, intracranial hemorrhage, mass, midline shift, or extra-axial fluid collection. There is mild cerebral atrophy. Vascular: Calcified atherosclerosis at the skull base. No hyperdense vessel. Skull: No fracture or suspicious osseous lesion. Sinuses/Orbits: Visualized paranasal sinuses and mastoid air cells are clear. Unremarkable orbits. Other: None. IMPRESSION: No evidence of acute intracranial abnormality. Electronically Signed   By: Sebastian Ache M.D.   On: 04/02/2021 15:24    Procedures Procedures   Medications Ordered in ED Medications - No data to display  ED Course  I have reviewed the triage vital signs and the nursing notes.  Pertinent labs & imaging results that were available during my care of the patient were reviewed by me and considered in my medical decision making (see chart for details).    MDM Rules/Calculators/A&P                          Unclear if this is redemonstration of prior stroke or new acute stroke. tPA was not initiated given symptoms appear similar to prior stroke and acuity is unclear. Labs and CT imaging ordered, MRI of the brain pursued as well.  MRI technician states that the patient's pacemaker is not compatible for MRI here.  Case discussed with on-call neurology Dr.Linzen, recommending admission and transfer to [facility] for intracranial Doppler versus MRI of the brain at [facility].  Addendum: I spoke with the patient and discussed the plan with the patient.  I explained to him that I am not sure if this is old stroke that is kind of reactivated or if it is a new stroke and needs further work-up.  I explained to him that the neurologist  recommends transfer to [facility] for further imaging.  Patient expressed understanding asked all questions which were answered and declines transfer.  Patient is now ambulatory back to his normal gait,  he has been using a walker and able to weight-bear and walk across the ER.  He states he feels much better and declines transfer and prefers to follow-up on an outpatient basis.  Risk and benefits of missed stroke discussed with patient, appears to have decision-making capacity.  Refusing transfer and will prefer to follow-up on an outpatient basis given his symptoms have resolved.   Final Clinical Impression(s) / ED Diagnoses Final diagnoses:  Stroke-like episode    Rx / DC Orders ED Discharge Orders    None       Cheryll Cockayne, MD 04/02/21 1457    Cheryll Cockayne, MD 04/02/21 1549    Cheryll Cockayne, MD 04/02/21 1558

## 2021-04-02 NOTE — ED Notes (Signed)
Attempted several times to call report to facility.  Report given to [facility].

## 2021-04-02 NOTE — ED Notes (Signed)
Ambulated around nurses station very well.  Gait steady.  Pt is asking for walker on discharge back to facility.

## 2021-04-02 NOTE — ED Notes (Signed)
Patient transported to CT 

## 2021-04-02 NOTE — ED Triage Notes (Signed)
Pt is from [facility]).  Pt reports having numbness to left leg from knee down.  Reports falling on yesterday from numbness to left leg and left hand.  LKW around 0800 but pt is not really sure  Pt has long history of strokes.  Dr Audley Hose at bedside.

## 2021-04-03 DIAGNOSIS — R42 Dizziness and giddiness: Secondary | ICD-10-CM | POA: Diagnosis not present

## 2021-04-03 DIAGNOSIS — R262 Difficulty in walking, not elsewhere classified: Secondary | ICD-10-CM | POA: Diagnosis not present

## 2021-04-03 DIAGNOSIS — R4182 Altered mental status, unspecified: Secondary | ICD-10-CM | POA: Diagnosis not present

## 2021-04-03 DIAGNOSIS — N400 Enlarged prostate without lower urinary tract symptoms: Secondary | ICD-10-CM | POA: Diagnosis not present

## 2021-04-03 DIAGNOSIS — I509 Heart failure, unspecified: Secondary | ICD-10-CM | POA: Diagnosis not present

## 2021-04-03 DIAGNOSIS — R531 Weakness: Secondary | ICD-10-CM | POA: Diagnosis not present

## 2021-04-03 DIAGNOSIS — G319 Degenerative disease of nervous system, unspecified: Secondary | ICD-10-CM | POA: Diagnosis not present

## 2021-04-03 DIAGNOSIS — G9389 Other specified disorders of brain: Secondary | ICD-10-CM | POA: Diagnosis not present

## 2021-04-03 DIAGNOSIS — I700 Atherosclerosis of aorta: Secondary | ICD-10-CM | POA: Diagnosis not present

## 2021-04-03 DIAGNOSIS — I110 Hypertensive heart disease with heart failure: Secondary | ICD-10-CM | POA: Diagnosis not present

## 2021-04-03 DIAGNOSIS — R55 Syncope and collapse: Secondary | ICD-10-CM | POA: Diagnosis not present

## 2021-04-06 ENCOUNTER — Encounter (HOSPITAL_COMMUNITY): Payer: Self-pay | Admitting: Emergency Medicine

## 2021-04-06 ENCOUNTER — Emergency Department (HOSPITAL_COMMUNITY)
Admission: EM | Admit: 2021-04-06 | Discharge: 2021-04-06 | Disposition: A | Discharge status: Nursing Home (Includes ICF) | Payer: Medicare HMO | Attending: Emergency Medicine | Admitting: Emergency Medicine

## 2021-04-06 ENCOUNTER — Other Ambulatory Visit: Payer: Self-pay

## 2021-04-06 ENCOUNTER — Emergency Department (HOSPITAL_COMMUNITY): Payer: Medicare HMO

## 2021-04-06 ENCOUNTER — Emergency Department (HOSPITAL_COMMUNITY)
Admission: EM | Admit: 2021-04-06 | Discharge: 2021-04-07 | Disposition: A | Discharge status: Home / Self Care | Payer: Medicare HMO | Source: Home / Self Care | Attending: Emergency Medicine | Admitting: Emergency Medicine

## 2021-04-06 DIAGNOSIS — Y92009 Unspecified place in unspecified non-institutional (private) residence as the place of occurrence of the external cause: Secondary | ICD-10-CM | POA: Diagnosis not present

## 2021-04-06 DIAGNOSIS — Z7984 Long term (current) use of oral hypoglycemic drugs: Secondary | ICD-10-CM | POA: Insufficient documentation

## 2021-04-06 DIAGNOSIS — S0590XA Unspecified injury of unspecified eye and orbit, initial encounter: Secondary | ICD-10-CM | POA: Diagnosis not present

## 2021-04-06 DIAGNOSIS — Z951 Presence of aortocoronary bypass graft: Secondary | ICD-10-CM | POA: Insufficient documentation

## 2021-04-06 DIAGNOSIS — R52 Pain, unspecified: Secondary | ICD-10-CM | POA: Diagnosis not present

## 2021-04-06 DIAGNOSIS — E119 Type 2 diabetes mellitus without complications: Secondary | ICD-10-CM | POA: Insufficient documentation

## 2021-04-06 DIAGNOSIS — I5030 Unspecified diastolic (congestive) heart failure: Secondary | ICD-10-CM | POA: Insufficient documentation

## 2021-04-06 DIAGNOSIS — H209 Unspecified iridocyclitis: Secondary | ICD-10-CM | POA: Diagnosis not present

## 2021-04-06 DIAGNOSIS — Z7982 Long term (current) use of aspirin: Secondary | ICD-10-CM | POA: Insufficient documentation

## 2021-04-06 DIAGNOSIS — Z79899 Other long term (current) drug therapy: Secondary | ICD-10-CM | POA: Insufficient documentation

## 2021-04-06 DIAGNOSIS — H20011 Primary iridocyclitis, right eye: Secondary | ICD-10-CM | POA: Insufficient documentation

## 2021-04-06 DIAGNOSIS — Z7902 Long term (current) use of antithrombotics/antiplatelets: Secondary | ICD-10-CM | POA: Insufficient documentation

## 2021-04-06 DIAGNOSIS — S0591XD Unspecified injury of right eye and orbit, subsequent encounter: Secondary | ICD-10-CM | POA: Insufficient documentation

## 2021-04-06 DIAGNOSIS — I110 Hypertensive heart disease with heart failure: Secondary | ICD-10-CM | POA: Diagnosis not present

## 2021-04-06 DIAGNOSIS — I10 Essential (primary) hypertension: Secondary | ICD-10-CM | POA: Insufficient documentation

## 2021-04-06 DIAGNOSIS — S0990XA Unspecified injury of head, initial encounter: Secondary | ICD-10-CM | POA: Diagnosis not present

## 2021-04-06 DIAGNOSIS — Z794 Long term (current) use of insulin: Secondary | ICD-10-CM | POA: Insufficient documentation

## 2021-04-06 DIAGNOSIS — I4891 Unspecified atrial fibrillation: Secondary | ICD-10-CM | POA: Insufficient documentation

## 2021-04-06 DIAGNOSIS — S0993XA Unspecified injury of face, initial encounter: Secondary | ICD-10-CM | POA: Diagnosis not present

## 2021-04-06 DIAGNOSIS — S01511A Laceration without foreign body of lip, initial encounter: Secondary | ICD-10-CM | POA: Insufficient documentation

## 2021-04-06 DIAGNOSIS — S0591XA Unspecified injury of right eye and orbit, initial encounter: Secondary | ICD-10-CM | POA: Diagnosis not present

## 2021-04-06 MED ORDER — ACETAMINOPHEN 325 MG PO TABS
650.0000 mg | ORAL_TABLET | Freq: Once | ORAL | Status: AC
  Administered 2021-04-06: 650 mg via ORAL
  Filled 2021-04-06: qty 2

## 2021-04-06 MED ORDER — TETRACAINE HCL 0.5 % OP SOLN
2.0000 [drp] | Freq: Once | OPHTHALMIC | Status: DC
  Filled 2021-04-06: qty 4

## 2021-04-06 MED ORDER — FLUORESCEIN SODIUM 1 MG OP STRP
1.0000 | ORAL_STRIP | Freq: Once | OPHTHALMIC | Status: DC
  Filled 2021-04-06: qty 1
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
## 2021-04-06 NOTE — ED Notes (Signed)
Multiple attempts made to contact [facility] to inform them the patient is going to an opthamologist.
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
## 2021-04-06 NOTE — ED Triage Notes (Signed)
Patient was seen at [facility] today, was sent to the eye doctor, went back to his group home who said that he needed to get in touch with his guardian before he would return

## 2021-04-07 DIAGNOSIS — F29 Unspecified psychosis not due to a substance or known physiological condition: Secondary | ICD-10-CM | POA: Diagnosis not present

## 2021-04-07 DIAGNOSIS — L0390 Cellulitis, unspecified: Secondary | ICD-10-CM | POA: Diagnosis not present

## 2021-04-07 DIAGNOSIS — R609 Edema, unspecified: Secondary | ICD-10-CM | POA: Diagnosis not present

## 2021-04-07 DIAGNOSIS — S0590XA Unspecified injury of unspecified eye and orbit, initial encounter: Secondary | ICD-10-CM | POA: Diagnosis not present

## 2021-04-07 DIAGNOSIS — I499 Cardiac arrhythmia, unspecified: Secondary | ICD-10-CM | POA: Diagnosis not present

## 2021-04-07 MED ORDER — ACETAMINOPHEN 325 MG PO TABS
650.0000 mg | ORAL_TABLET | Freq: Once | ORAL | Status: AC
  Administered 2021-04-07: 650 mg via ORAL
  Filled 2021-04-07: qty 2

## 2021-04-07 NOTE — Discharge Instructions (Addendum)
Please follow instructions by eye doctor - use your eye drops morning, afternoon and night  Return for worsening eye pain, redness, warmth, drainage, worsening vision

## 2021-04-07 NOTE — TOC Transition Note (Signed)
Transition of Care [facility]) - CM/SW Discharge Note   Patient Details  Name: Jason [name] MRN: [id number] Date of Birth: [birth year]  [facility] CM/SW Contact:  Annalee Genta, LCSW Phone Number: 04/07/2021, 12:26 PM   Clinical Narrative:    [facility] consult for difficult social situation. Patient has a legal guardian with [employer], Dessie Coma and is residing at an [place] assisted living facility/group home, [facility], [phone]). Essentially, the group home has a discharge notice that has handwritten on it patient is an immediate discharge in agreement with guardian while guardian is reporting she never agreed to an immediate discharge. The form is signed with an illegible signature the group home reports was a Child psychotherapist at [facility] DSS who cannot be reached as it is the weekend. Ultimately, as it is unable to be verified patient had been immediately discharged that was supported by [facility] or DSS, guardian is denying she agreed to discharge and no documentation suggesting otherwise, and there is no emergent medical needs patient is being discharged back to facility after discussing with provider team. CSW additionally requested the PA to contact Syn[facility] Fail to confirm DC. CSW was contacted again by group home owner who continued to express her concerns and re-affirmed patient is being discharged as no medical reason. CSW acknowledges the concerns and discussed following up with the proper agencies to address concerns with guardianship agency. CSW noted group home owner reported patient will be sent right back to the hospital and under these circumstances CSW would follow-up with [employer]. CSW affirmed discharge plan and noted group home owner reported she was contact [facility]. CSW looped in [facility] leadership regarding discharge.   When patient is ready for discharge please contact [facility] for transport.     Final next level of  care: Group Home Barriers to Discharge: Other (must enter comment) (Disagreement between facility and guardian)   Patient Goals and CMS Choice Patient states their goals for this hospitalization and ongoing recovery are:: "I just want to go back there."      Discharge Placement                       Discharge Plan and Services                                     Social Determinants of Health (SDOH) Interventions     Readmission Risk Interventions No flowsheet data found.

## 2021-04-07 NOTE — ED Notes (Signed)
Pt ambulated to the bathroom with no issues. Will continue to monitor.
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
## 2021-04-07 NOTE — Progress Notes (Signed)
CSW left voicemail with on call line for [facility] (guardianship agency) at [phone]. CSW attempted to reach facility to clarify and noted phone disconnected. CSW contacted authorized individual/owner per NPI records and noted phone number disconnected. CSW will continue to follow.

## 2021-04-07 NOTE — ED Notes (Signed)
Pt requesting something to eat at this time. Placed diet order and order pt a breakfast tray. Will continue to monitor.

## 2021-04-08 ENCOUNTER — Emergency Department (HOSPITAL_COMMUNITY): Payer: Medicare HMO

## 2021-04-08 ENCOUNTER — Emergency Department (HOSPITAL_COMMUNITY)
Admission: EM | Admit: 2021-04-08 | Discharge: 2021-04-09 | Disposition: A | Discharge status: Home / Self Care | Payer: Medicare HMO | Attending: Emergency Medicine | Admitting: Emergency Medicine

## 2021-04-08 DIAGNOSIS — E860 Dehydration: Secondary | ICD-10-CM | POA: Diagnosis not present

## 2021-04-08 DIAGNOSIS — Z043 Encounter for examination and observation following other accident: Secondary | ICD-10-CM | POA: Diagnosis not present

## 2021-04-08 DIAGNOSIS — Z79899 Other long term (current) drug therapy: Secondary | ICD-10-CM | POA: Insufficient documentation

## 2021-04-08 DIAGNOSIS — W19XXXA Unspecified fall, initial encounter: Secondary | ICD-10-CM | POA: Insufficient documentation

## 2021-04-08 DIAGNOSIS — Z7902 Long term (current) use of antithrombotics/antiplatelets: Secondary | ICD-10-CM | POA: Insufficient documentation

## 2021-04-08 DIAGNOSIS — S59912A Unspecified injury of left forearm, initial encounter: Secondary | ICD-10-CM | POA: Diagnosis present

## 2021-04-08 DIAGNOSIS — Z794 Long term (current) use of insulin: Secondary | ICD-10-CM | POA: Diagnosis not present

## 2021-04-08 DIAGNOSIS — Z951 Presence of aortocoronary bypass graft: Secondary | ICD-10-CM | POA: Diagnosis not present

## 2021-04-08 DIAGNOSIS — J90 Pleural effusion, not elsewhere classified: Secondary | ICD-10-CM | POA: Diagnosis not present

## 2021-04-08 DIAGNOSIS — R4020 Unspecified coma: Secondary | ICD-10-CM | POA: Diagnosis not present

## 2021-04-08 DIAGNOSIS — Z7984 Long term (current) use of oral hypoglycemic drugs: Secondary | ICD-10-CM | POA: Diagnosis not present

## 2021-04-08 DIAGNOSIS — E119 Type 2 diabetes mellitus without complications: Secondary | ICD-10-CM | POA: Insufficient documentation

## 2021-04-08 DIAGNOSIS — I110 Hypertensive heart disease with heart failure: Secondary | ICD-10-CM | POA: Insufficient documentation

## 2021-04-08 DIAGNOSIS — S5012XA Contusion of left forearm, initial encounter: Secondary | ICD-10-CM | POA: Diagnosis not present

## 2021-04-08 DIAGNOSIS — I5030 Unspecified diastolic (congestive) heart failure: Secondary | ICD-10-CM | POA: Diagnosis not present

## 2021-04-08 DIAGNOSIS — I959 Hypotension, unspecified: Secondary | ICD-10-CM | POA: Diagnosis not present

## 2021-04-08 DIAGNOSIS — R0902 Hypoxemia: Secondary | ICD-10-CM | POA: Diagnosis not present

## 2021-04-08 DIAGNOSIS — Z7982 Long term (current) use of aspirin: Secondary | ICD-10-CM | POA: Diagnosis not present

## 2021-04-08 DIAGNOSIS — R0600 Dyspnea, unspecified: Secondary | ICD-10-CM | POA: Diagnosis not present

## 2021-04-08 DIAGNOSIS — R42 Dizziness and giddiness: Secondary | ICD-10-CM | POA: Insufficient documentation

## 2021-04-08 DIAGNOSIS — I517 Cardiomegaly: Secondary | ICD-10-CM | POA: Diagnosis not present

## 2021-04-08 LAB — CBC WITH DIFFERENTIAL/PLATELET
Abs Immature Granulocytes: 0.04 10*3/uL (ref 0.00–0.07)
Basophils Absolute: 0 10*3/uL (ref 0.0–0.1)
Basophils Relative: 0 %
Eosinophils Absolute: 0.2 10*3/uL (ref 0.0–0.5)
Eosinophils Relative: 2 %
HCT: 35.5 % — ABNORMAL LOW (ref 39.0–52.0)
Hemoglobin: 11.2 g/dL — ABNORMAL LOW (ref 13.0–17.0)
Immature Granulocytes: 1 %
Lymphocytes Relative: 12 %
Lymphs Abs: 1.1 10*3/uL (ref 0.7–4.0)
MCH: 26.5 pg (ref 26.0–34.0)
MCHC: 31.5 g/dL (ref 30.0–36.0)
MCV: 83.9 fL (ref 80.0–100.0)
Monocytes Absolute: 0.8 10*3/uL (ref 0.1–1.0)
Monocytes Relative: 9 %
Neutro Abs: 6.7 10*3/uL (ref 1.7–7.7)
Neutrophils Relative %: 76 %
Platelets: 207 10*3/uL (ref 150–400)
RBC: 4.23 MIL/uL (ref 4.22–5.81)
RDW: 16.6 % — ABNORMAL HIGH (ref 11.5–15.5)
WBC: 8.8 10*3/uL (ref 4.0–10.5)
nRBC: 0 % (ref 0.0–0.2)

## 2021-04-08 LAB — TROPONIN I (HIGH SENSITIVITY)
Troponin I (High Sensitivity): 16 ng/L (ref <18)
Troponin I (High Sensitivity): 15 ng/L (ref <18)

## 2021-04-08 LAB — COMPREHENSIVE METABOLIC PANEL
ALT: 13 U/L (ref 0–44)
AST: 22 U/L (ref 15–41)
Albumin: 3.6 g/dL (ref 3.5–5.0)
Alkaline Phosphatase: 76 U/L (ref 38–126)
Anion gap: 9 (ref 5–15)
BUN: 28 mg/dL — ABNORMAL HIGH (ref 8–23)
CO2: 28 mmol/L (ref 22–32)
Calcium: 8.8 mg/dL — ABNORMAL LOW (ref 8.9–10.3)
Chloride: 97 mmol/L — ABNORMAL LOW (ref 98–111)
Creatinine, Ser: 1.71 mg/dL — ABNORMAL HIGH (ref 0.61–1.24)
GFR, Estimated: 43 mL/min — ABNORMAL LOW (ref >60)
Glucose, Bld: 239 mg/dL — ABNORMAL HIGH (ref 70–99)
Potassium: 3.7 mmol/L (ref 3.5–5.1)
Sodium: 134 mmol/L — ABNORMAL LOW (ref 135–145)
Total Bilirubin: 0.7 mg/dL (ref 0.3–1.2)
Total Protein: 6.6 g/dL (ref 6.5–8.1)

## 2021-04-08 MED ORDER — SODIUM CHLORIDE 0.9 % IV BOLUS
1000.0000 mL | Freq: Once | INTRAVENOUS | Status: AC
  Administered 2021-04-08: 1000 mL via INTRAVENOUS

## 2021-04-08 MED ORDER — SODIUM CHLORIDE 0.9 % IV BOLUS
500.0000 mL | Freq: Once | INTRAVENOUS | Status: AC
  Administered 2021-04-08: 500 mL via INTRAVENOUS

## 2021-04-08 NOTE — ED Notes (Signed)
Pt states he would like assistance to the restroom. Upon ambulating back pt states he feels dizzy and is unsteady. Complaints of dizziness all day.

## 2021-04-08 NOTE — ED Provider Notes (Signed)
[facility] EMERGENCY DEPARTMENT Provider Note   CSN: [id number] Arrival date & time: 04/08/21  1836     History Chief Complaint  Patient presents with  . Dizziness    Pt  . Fall    Jason [name] is a 70 y.o. male.  Patient has been dizzy and passing out and hitting his head.  He states that he feels lightheaded when he stands up.  No fever no chills no cough  The history is provided by the patient and medical records. No language interpreter was used.  Dizziness Quality:  Lightheadedness Severity:  Moderate Onset quality:  Sudden Timing:  Constant Progression:  Unable to specify Chronicity:  New Context: not when bending over   Associated symptoms: no chest pain, no diarrhea and no headaches   Fall Pertinent negatives include no chest pain, no abdominal pain and no headaches.       Past Medical History:  Diagnosis Date  . Diabetes mellitus without complication (HCC)   . GERD (gastroesophageal reflux disease)   . Hypertension   . Paranoid schizophrenia (HCC)   . Stroke (HCC)   . Tourette disorder     Patient Active Problem List   Diagnosis Date Noted  . Hx of myocardial infarction 02/26/2021  . Impaired mobility 02/26/2021  . Community acquired pneumonia 10/14/2018  . Hypokalemia due to inadequate potassium intake 08/16/2018  . Malingering 08/07/2018  . Intellectual disability 08/07/2018  . Paranoid schizophrenia (HCC) 07/23/2018  . Severe episode of recurrent major depressive disorder, without psychotic features (HCC) 10/16/2017  . Postural dizziness with presyncope 10/01/2017  . Patient noncompliance 08/19/2017  . Stasis ulcer (HCC) 08/19/2017  . Venous stasis ulcer of right calf limited to breakdown of skin with varicose veins (HCC) 08/19/2017  . Benign prostatic hyperplasia without lower urinary tract symptoms 08/12/2017  . Closed fracture of anterior column of acetabulum with routine healing, left 03/28/2017  . Disorder of bone density and  structure, unspecified 03/27/2017  . Hypogonadism in male 03/27/2017  . At high risk for falls 03/04/2017  . Cognitive communication deficit 03/04/2017  . Senile osteoporosis 03/04/2017  . Left arm pain 02/16/2017  . Diastolic heart failure (HCC) 02/06/2017  . HLD (hyperlipidemia) 02/06/2017  . Nondisplaced fracture of anterior wall of left acetabulum (HCC) 02/06/2017  . Syncope 02/06/2017  . Developmental delay 12/26/2016  . Obesity (BMI 30-39.9) 12/26/2016  . Cellulitis of left lower extremity 08/29/2016  . Slow transit constipation 06/24/2016  . Skin lesion of left lower limb 06/13/2016  . Episode of recurrent major depressive disorder (HCC) 06/12/2016  . Bulging of cervical intervertebral disc 12/14/2015  . Hx of CABG 12/14/2015  . OSA (obstructive sleep apnea) 12/14/2015  . Type 2 diabetes mellitus with hyperglycemia (HCC) 12/14/2015  . Elevated troponin 03/14/2015  . Positive blood culture 03/14/2015  . Chronic anticoagulation 03/13/2015  . Morbid obesity due to excess calories (HCC) 03/13/2015  . Paroxysmal atrial fibrillation (HCC) 03/13/2015  . Pleural effusion on left 03/13/2015  . Schizoaffective disorder (HCC) 03/13/2015  . SOB (shortness of breath) 03/13/2015  . Borderline personality disorder (HCC) 04/22/2014  . Anxiety 02/26/2014  . Transient ischemic attack (TIA) 02/04/2014  . Resting tremor 02/04/2014  . Stroke-like symptoms 06/24/2013  . Benign paroxysmal vertigo, unspecified ear 04/30/2013  . Chest pain, unspecified 04/12/2013  . Vitamin D deficiency, unspecified 06/03/2011  . Disease of thyroid gland 03/04/2011  . Essential hypertension 03/04/2011  . Dysphonia of [place] de [place] Tourette's syndrome 09/10/2010  . Generalized anxiety disorder  09/10/2010  . Hearing loss 09/10/2010    Past Surgical History:  Procedure Laterality Date  . CARDIAC SURGERY    . CORONARY ARTERY BYPASS GRAFT    . HAND SURGERY    . TONSILLECTOMY         No family history on  file.  Social History   Tobacco Use  . Smoking status: Never Smoker  . Smokeless tobacco: Never Used  Vaping Use  . Vaping Use: Never used  Substance Use Topics  . Alcohol use: Not Currently  . Drug use: Not Currently    Home Medications Prior to Admission medications   Medication Sig Start Date End Date Taking? Authorizing Provider  acetaminophen (TYLENOL) 500 MG tablet Take 500 mg by mouth every 4 (four) hours as needed for mild pain or fever.     [provider]  albuterol (VENTOLIN HFA) 108 (90 Base) MCG/ACT inhaler Inhale into the lungs. 01/01/21   [provider]  amLODipine (NORVASC) 10 MG tablet Take 1 tablet (10 mg total) by mouth daily. Patient not taking: Reported on 04/02/2021 02/01/19   Blane OharaZavitz, Joshua, MD  ARIPiprazole (ABILIFY) 5 MG tablet Take 5 mg by mouth daily. 02/05/21   [provider]  aspirin EC 325 MG tablet Take 1 tablet (325 mg total) by mouth daily. 02/01/19   Blane OharaZavitz, Joshua, MD  atorvastatin (LIPITOR) 80 MG tablet Take 1 tablet (80 mg total) by mouth at bedtime. 02/01/19   Blane OharaZavitz, Joshua, MD  benztropine (COGENTIN) 0.5 MG tablet Take 1 tablet (0.5 mg total) by mouth 2 (two) times daily. 02/01/19   Blane OharaZavitz, Joshua, MD  carbidopa-levodopa (SINEMET IR) 25-100 MG tablet Take 1 tablet by mouth in the morning and at bedtime. 01/31/21   [provider]  carvedilol (COREG) 6.25 MG tablet Take 1 tablet (6.25 mg total) by mouth 2 (two) times daily. 02/01/19   Blane OharaZavitz, Joshua, MD  clopidogrel (PLAVIX) 75 MG tablet Take 1 tablet (75 mg total) by mouth daily. 02/01/19   Blane OharaZavitz, Joshua, MD  divalproex (DEPAKOTE ER) 500 MG 24 hr tablet Take 2 tablets (1,000 mg total) by mouth daily before supper. At 1600 Patient not taking: Reported on 04/02/2021 02/01/19   Blane OharaZavitz, Joshua, MD  ezetimibe (ZETIA) 10 MG tablet Take 1 tablet (10 mg total) by mouth daily. Patient not taking: Reported on 04/02/2021 02/01/19   Blane OharaZavitz, Joshua, MD  furosemide (LASIX) 40 MG  tablet Take 1 tablet (40 mg total) by mouth 2 (two) times daily. Patient taking differently: Take 30 mg by mouth daily. 02/01/19   Blane OharaZavitz, Joshua, MD  gabapentin (NEURONTIN) 300 MG capsule Take 1 capsule (300 mg total) by mouth 3 (three) times daily. 02/01/19   Blane OharaZavitz, Joshua, MD  glipiZIDE (GLUCOTROL) 5 MG tablet Take 1 tablet (5 mg total) by mouth daily. Patient not taking: No sig reported 02/01/19   Blane OharaZavitz, Joshua, MD  insulin aspart (NOVOLOG) 100 UNIT/ML injection Inject 0-20 Units into the skin 3 (three) times daily with meals. Question Answer Comment Correction coverage: Resistant (obese, steroids)  CBG < 70: implement hypoglycemia protocol  CBG 70 - 120: 0 units  CBG 121 - 150: 3 units  CBG 151 - 200: 4 units  CBG 201 - 250: 7 units  CBG 251 - 300: 11 units  CBG 301 - 350: 15 units  CBG 351 - 400: 20 units  CBG > 400 call MD Patient taking differently: Inject 0-20 Units into the skin 3 (three) times daily with meals. CBG < 70: implement  hypoglycemia protocol  CBG 70 - 150: 0 units  CBG 151 - 200: 2 units  CBG 201 - 250: 4 units  CBG 251 - 300: 6 units  CBG 301 - 350: 8 units  CBG 351 - 400: 11 units  CBG > 400= 13U 02/01/19   Blane Ohara, MD  insulin degludec (TRESIBA) 100 UNIT/ML SOPN FlexTouch Pen Inject 0.3 mLs (30 Units total) into the skin at bedtime. Patient not taking: No sig reported 02/01/19   Blane Ohara, MD  Insulin Glargine [facility]) 100 UNIT/ML Inject 80 Units into the skin daily. 02/05/21   [provider]  insulin lispro (HUMALOG) 100 UNIT/ML injection Inject 0.06 mLs (6 Units total) into the skin 3 (three) times daily with meals. Patient not taking: No sig reported 02/01/19   Blane Ohara, MD  isosorbide mononitrate (IMDUR) 60 MG 24 hr tablet Take 1 tablet (60 mg total) by mouth daily. 02/01/19   Blane Ohara, MD  LORazepam (ATIVAN) 0.5 MG tablet Take 1 tablet (0.5 mg total) by mouth 3 (three) times daily. Patient not taking: Reported on  04/02/2021 02/01/19   Blane Ohara, MD  metFORMIN (GLUCOPHAGE) 1000 MG tablet Take 1 tablet (1,000 mg total) by mouth 2 (two) times daily. 02/01/19   Blane Ohara, MD  metolazone (ZAROXOLYN) 2.5 MG tablet Take 1 tablet (2.5 mg total) by mouth daily. Patient not taking: Reported on 04/02/2021 02/01/19   Blane Ohara, MD  nitroGLYCERIN (NITROSTAT) 0.4 MG SL tablet Place 1 tablet (0.4 mg total) under the tongue every 5 (five) minutes as needed for chest pain. 02/01/19   Blane Ohara, MD  omeprazole (PRILOSEC) 20 MG capsule Take 1 capsule by mouth daily. 01/31/21   [provider]  Pimozide 1 MG TABS Take 1 tablet (1 mg total) by mouth 2 (two) times daily. 02/01/19   Blane Ohara, MD  Potassium Chloride ER 20 MEQ TBCR Take 20 mEq by mouth 2 (two) times daily. Patient not taking: Reported on 04/02/2021 02/01/19   Blane Ohara, MD  risperiDONE (RISPERDAL) 1 MG tablet Take 1.5 tablets (1.5 mg total) by mouth 2 (two) times daily. Patient not taking: Reported on 04/02/2021 02/01/19   Blane Ohara, MD  tamsulosin (FLOMAX) 0.4 MG CAPS capsule Take 1 capsule (0.4 mg total) by mouth daily. 02/01/19   Blane Ohara, MD  VIMPAT 50 MG TABS tablet Take 100 mg by mouth 2 (two) times daily as needed. 02/06/21   [provider]  Vitamin D, Ergocalciferol, (DRISDOL) 1.25 MG (50000 UT) CAPS capsule Take 1 capsule (50,000 Units total) by mouth every Friday. Patient not taking: Reported on 04/02/2021 02/05/19   Blane Ohara, MD    Allergies    Keflex [cephalexin]  Review of Systems   Review of Systems  Constitutional: Negative for appetite change and fatigue.  HENT: Negative for congestion, ear discharge and sinus pressure.   Eyes: Negative for discharge.  Respiratory: Negative for cough.   Cardiovascular: Negative for chest pain.  Gastrointestinal: Negative for abdominal pain and diarrhea.  Genitourinary: Negative for frequency and hematuria.  Musculoskeletal: Negative for back pain.  Skin:  Negative for rash.  Neurological: Positive for dizziness. Negative for seizures and headaches.  Psychiatric/Behavioral: Negative for hallucinations.    Physical Exam Updated Vital Signs BP (!) 116/99   Pulse 66   Temp 98.5 F (36.9 C) (Oral)   Resp 13   Ht 6' (1.829 m)   Wt 122.5 kg   SpO2 98%   BMI 36.62 kg/m  Physical Exam Constitutional:      Appearance: He is well-developed.  HENT:     Head: Normocephalic.     Comments: Bruising left forearm    Nose: Nose normal.  Eyes:     General: No scleral icterus.    Conjunctiva/sclera: Conjunctivae normal.  Neck:     Thyroid: No thyromegaly.  Cardiovascular:     Rate and Rhythm: Normal rate and regular rhythm.     Heart sounds: No murmur heard. No friction rub. No gallop.   Pulmonary:     Breath sounds: No stridor. No wheezing or rales.  Chest:     Chest wall: No tenderness.  Abdominal:     General: There is no distension.     Tenderness: There is no abdominal tenderness. There is no rebound.  Musculoskeletal:        General: Normal range of motion.     Cervical back: Neck supple.  Lymphadenopathy:     Cervical: No cervical adenopathy.  Skin:    Findings: No erythema or rash.  Neurological:     Mental Status: He is alert and oriented to person, place, and time.     Motor: No abnormal muscle tone.     Coordination: Coordination normal.  Psychiatric:        Behavior: Behavior normal.     ED Results / Procedures / Treatments   Labs (all labs ordered are listed, but only abnormal results are displayed) Labs Reviewed  CBC WITH DIFFERENTIAL/PLATELET - Abnormal; Notable for the following components:      Result Value   Hemoglobin 11.2 (*)    HCT 35.5 (*)    RDW 16.6 (*)    All other components within normal limits  COMPREHENSIVE METABOLIC PANEL - Abnormal; Notable for the following components:   Sodium 134 (*)    Chloride 97 (*)    Glucose, Bld 239 (*)    BUN 28 (*)    Creatinine, Ser 1.71 (*)    Calcium  8.8 (*)    GFR, Estimated 43 (*)    All other components within normal limits  TROPONIN I (HIGH SENSITIVITY)  TROPONIN I (HIGH SENSITIVITY)    EKG None  Radiology CT Head Wo Contrast  Result Date: 04/08/2021 CLINICAL DATA:  Fall EXAM: CT HEAD WITHOUT CONTRAST TECHNIQUE: Contiguous axial images were obtained from the base of the skull through the vertex without intravenous contrast. COMPARISON:  None. FINDINGS: Brain: There is no mass, hemorrhage or extra-axial collection. The size and configuration of the ventricles and extra-axial CSF spaces are normal. The brain parenchyma is normal, without acute or chronic infarction. Vascular: No abnormal hyperdensity of the major intracranial arteries or dural venous sinuses. No intracranial atherosclerosis. Skull: The visualized skull base, calvarium and extracranial soft tissues are normal. Sinuses/Orbits: No fluid levels or advanced mucosal thickening of the visualized paranasal sinuses. No mastoid or middle ear effusion. The orbits are normal. IMPRESSION: Normal head CT. Electronically Signed   By: Deatra Robinson M.D.   On: 04/08/2021 20:42   DG Chest Port 1 View  Result Date: 04/08/2021 CLINICAL DATA:  Dyspnea EXAM: PORTABLE CHEST 1 VIEW COMPARISON:  02/01/2021 FINDINGS: The lungs are symmetrically well expanded. There is left basilar opacification which is likely artifactual and related to overlying soft tissue, similar to that seen on prior examination of 10/14/2018, though a small left pleural effusion could appear similarly. The lungs are otherwise clear. No pneumothorax. No pleural effusion on the right. Right subclavian dual lead pacemaker is unchanged.  Median sternotomy has been performed. Mild cardiomegaly is stable. Pulmonary vascularity is normal. No acute bone abnormality. IMPRESSION: Left basilar opacification is likely artifactual, though a small left pleural effusion could appear similarly. No radiographic evidence of acute cardiopulmonary  disease. Electronically Signed   By: Helyn Numbers MD   On: 04/08/2021 20:42    Procedures Procedures   Medications Ordered in ED Medications  sodium chloride 0.9 % bolus 1,000 mL (0 mLs Intravenous Stopped 04/08/21 2238)  sodium chloride 0.9 % bolus 1,000 mL (0 mLs Intravenous Stopped 04/08/21 2238)  sodium chloride 0.9 % bolus 500 mL (500 mLs Intravenous New Bag/Given 04/08/21 2256)    ED Course  I have reviewed the triage vital signs and the nursing notes.  Pertinent labs & imaging results that were available during my care of the patient were reviewed by me and considered in my medical decision making (see chart for details).    MDM Rules/Calculators/A&P                          Patient with significant dehydration and was orthostatic.  Once he was given 2 L of fluids he felt much better and was just minimally orthostatic.  Patient was given another 500 cc and was discharged home and his Lasix has been held for now.  Patient will follow up with his doctor this week Final Clinical Impression(s) / ED Diagnoses Final diagnoses:  Dehydration    Rx / DC Orders ED Discharge Orders    None       Bethann Berkshire, MD 04/10/21 [phone]

## 2021-04-08 NOTE — ED Notes (Signed)
Spoke to [employer] from the [facility]. She is aware of patients discharge. Continuing to make contact with other staff being Synetta Fail is not at the home during this hour.

## 2021-04-08 NOTE — Discharge Instructions (Addendum)
Stop taking the Lasix until you see your doctor later this week for recheck.  drink plenty of fluids

## 2021-04-08 NOTE — ED Triage Notes (Signed)
Pt c/o weakness and fall. EMS reports positive ortho's.

## 2021-04-09 DIAGNOSIS — H2513 Age-related nuclear cataract, bilateral: Secondary | ICD-10-CM | POA: Diagnosis not present

## 2021-04-09 DIAGNOSIS — H20041 Secondary noninfectious iridocyclitis, right eye: Secondary | ICD-10-CM | POA: Diagnosis not present

## 2021-04-09 DIAGNOSIS — Z743 Need for continuous supervision: Secondary | ICD-10-CM | POA: Diagnosis not present

## 2021-04-09 DIAGNOSIS — I10 Essential (primary) hypertension: Secondary | ICD-10-CM | POA: Diagnosis not present

## 2021-04-09 DIAGNOSIS — R531 Weakness: Secondary | ICD-10-CM | POA: Diagnosis not present

## 2021-04-09 NOTE — ED Notes (Signed)
Patient given meal tray at this time and ate 100%

## 2021-04-09 NOTE — ED Notes (Signed)
Ambulatory to bathroom without difficulty.   

## 2021-04-09 NOTE — ED Notes (Signed)
Patient speaking to legal guardian at this time

## 2021-04-09 NOTE — ED Notes (Signed)
Report received from night shift RN. Patient lying on left side with eyes closed. Respirations even and unlabored. NAD noted. Currently awaiting EMS transport back to [facility].

## 2021-04-10 DIAGNOSIS — H2000 Unspecified acute and subacute iridocyclitis: Secondary | ICD-10-CM | POA: Diagnosis not present

## 2021-04-17 DIAGNOSIS — E1140 Type 2 diabetes mellitus with diabetic neuropathy, unspecified: Secondary | ICD-10-CM | POA: Diagnosis not present

## 2021-04-17 DIAGNOSIS — Z950 Presence of cardiac pacemaker: Secondary | ICD-10-CM | POA: Diagnosis not present

## 2021-04-17 DIAGNOSIS — F445 Conversion disorder with seizures or convulsions: Secondary | ICD-10-CM | POA: Diagnosis not present

## 2021-04-17 DIAGNOSIS — I509 Heart failure, unspecified: Secondary | ICD-10-CM | POA: Diagnosis not present

## 2021-04-23 DIAGNOSIS — F331 Major depressive disorder, recurrent, moderate: Secondary | ICD-10-CM | POA: Diagnosis not present

## 2021-04-23 DIAGNOSIS — F200 Paranoid schizophrenia: Secondary | ICD-10-CM | POA: Diagnosis not present

## 2021-04-23 DIAGNOSIS — F4312 Post-traumatic stress disorder, chronic: Secondary | ICD-10-CM | POA: Diagnosis not present

## 2021-04-24 DIAGNOSIS — E119 Type 2 diabetes mellitus without complications: Secondary | ICD-10-CM | POA: Diagnosis not present

## 2021-04-24 DIAGNOSIS — H2513 Age-related nuclear cataract, bilateral: Secondary | ICD-10-CM | POA: Diagnosis not present

## 2021-04-24 DIAGNOSIS — H2000 Unspecified acute and subacute iridocyclitis: Secondary | ICD-10-CM | POA: Diagnosis not present

## 2021-04-24 DIAGNOSIS — H524 Presbyopia: Secondary | ICD-10-CM | POA: Diagnosis not present

## 2021-05-15 DIAGNOSIS — F445 Conversion disorder with seizures or convulsions: Secondary | ICD-10-CM | POA: Diagnosis not present

## 2021-05-15 DIAGNOSIS — Z950 Presence of cardiac pacemaker: Secondary | ICD-10-CM | POA: Diagnosis not present

## 2021-05-15 DIAGNOSIS — I10 Essential (primary) hypertension: Secondary | ICD-10-CM | POA: Diagnosis not present

## 2021-05-15 DIAGNOSIS — I509 Heart failure, unspecified: Secondary | ICD-10-CM | POA: Diagnosis not present

## 2021-05-22 DIAGNOSIS — F331 Major depressive disorder, recurrent, moderate: Secondary | ICD-10-CM | POA: Diagnosis not present

## 2021-05-22 DIAGNOSIS — F200 Paranoid schizophrenia: Secondary | ICD-10-CM | POA: Diagnosis not present

## 2021-05-22 DIAGNOSIS — F4312 Post-traumatic stress disorder, chronic: Secondary | ICD-10-CM | POA: Diagnosis not present

## 2021-06-01 DIAGNOSIS — L0390 Cellulitis, unspecified: Secondary | ICD-10-CM | POA: Diagnosis not present

## 2021-06-03 ENCOUNTER — Emergency Department (HOSPITAL_COMMUNITY)
Admission: EM | Admit: 2021-06-03 | Discharge: 2021-06-03 | Disposition: A | Discharge status: Home / Self Care | Payer: Medicare HMO | Attending: Emergency Medicine | Admitting: Emergency Medicine

## 2021-06-03 ENCOUNTER — Encounter (HOSPITAL_COMMUNITY): Payer: Self-pay | Admitting: Emergency Medicine

## 2021-06-03 ENCOUNTER — Emergency Department (HOSPITAL_COMMUNITY): Payer: Medicare HMO

## 2021-06-03 ENCOUNTER — Other Ambulatory Visit: Payer: Self-pay

## 2021-06-03 ENCOUNTER — Emergency Department (HOSPITAL_COMMUNITY)
Admission: EM | Admit: 2021-06-03 | Discharge: 2021-06-03 | Disposition: A | Discharge status: Home / Self Care | Payer: Medicare HMO | Source: Home / Self Care | Attending: Emergency Medicine | Admitting: Emergency Medicine

## 2021-06-03 DIAGNOSIS — Z7982 Long term (current) use of aspirin: Secondary | ICD-10-CM | POA: Insufficient documentation

## 2021-06-03 DIAGNOSIS — Z7902 Long term (current) use of antithrombotics/antiplatelets: Secondary | ICD-10-CM | POA: Insufficient documentation

## 2021-06-03 DIAGNOSIS — I10 Essential (primary) hypertension: Secondary | ICD-10-CM | POA: Insufficient documentation

## 2021-06-03 DIAGNOSIS — Z951 Presence of aortocoronary bypass graft: Secondary | ICD-10-CM | POA: Insufficient documentation

## 2021-06-03 DIAGNOSIS — Z955 Presence of coronary angioplasty implant and graft: Secondary | ICD-10-CM | POA: Insufficient documentation

## 2021-06-03 DIAGNOSIS — Z79899 Other long term (current) drug therapy: Secondary | ICD-10-CM | POA: Insufficient documentation

## 2021-06-03 DIAGNOSIS — I110 Hypertensive heart disease with heart failure: Secondary | ICD-10-CM | POA: Diagnosis not present

## 2021-06-03 DIAGNOSIS — Z794 Long term (current) use of insulin: Secondary | ICD-10-CM | POA: Insufficient documentation

## 2021-06-03 DIAGNOSIS — R531 Weakness: Secondary | ICD-10-CM | POA: Diagnosis not present

## 2021-06-03 DIAGNOSIS — E119 Type 2 diabetes mellitus without complications: Secondary | ICD-10-CM | POA: Insufficient documentation

## 2021-06-03 DIAGNOSIS — R55 Syncope and collapse: Secondary | ICD-10-CM | POA: Insufficient documentation

## 2021-06-03 DIAGNOSIS — R42 Dizziness and giddiness: Secondary | ICD-10-CM | POA: Insufficient documentation

## 2021-06-03 DIAGNOSIS — Z8679 Personal history of other diseases of the circulatory system: Secondary | ICD-10-CM | POA: Insufficient documentation

## 2021-06-03 DIAGNOSIS — G20 Parkinson's disease: Secondary | ICD-10-CM | POA: Insufficient documentation

## 2021-06-03 DIAGNOSIS — Z743 Need for continuous supervision: Secondary | ICD-10-CM | POA: Diagnosis not present

## 2021-06-03 DIAGNOSIS — I5030 Unspecified diastolic (congestive) heart failure: Secondary | ICD-10-CM | POA: Diagnosis not present

## 2021-06-03 DIAGNOSIS — Z7984 Long term (current) use of oral hypoglycemic drugs: Secondary | ICD-10-CM | POA: Insufficient documentation

## 2021-06-03 DIAGNOSIS — I5032 Chronic diastolic (congestive) heart failure: Secondary | ICD-10-CM | POA: Diagnosis not present

## 2021-06-03 DIAGNOSIS — R739 Hyperglycemia, unspecified: Secondary | ICD-10-CM | POA: Diagnosis not present

## 2021-06-03 DIAGNOSIS — R279 Unspecified lack of coordination: Secondary | ICD-10-CM | POA: Diagnosis not present

## 2021-06-03 HISTORY — DX: Acute myocardial infarction, unspecified: I21.9

## 2021-06-03 LAB — CBC WITH DIFFERENTIAL/PLATELET
Abs Immature Granulocytes: 0.06 10*3/uL (ref 0.00–0.07)
Basophils Absolute: 0 10*3/uL (ref 0.0–0.1)
Basophils Relative: 0 %
Eosinophils Absolute: 0.3 10*3/uL (ref 0.0–0.5)
Eosinophils Relative: 3 %
HCT: 34.1 % — ABNORMAL LOW (ref 39.0–52.0)
Hemoglobin: 11.1 g/dL — ABNORMAL LOW (ref 13.0–17.0)
Immature Granulocytes: 1 %
Lymphocytes Relative: 10 %
Lymphs Abs: 1 10*3/uL (ref 0.7–4.0)
MCH: 28.5 pg (ref 26.0–34.0)
MCHC: 32.6 g/dL (ref 30.0–36.0)
MCV: 87.4 fL (ref 80.0–100.0)
Monocytes Absolute: 0.7 10*3/uL (ref 0.1–1.0)
Monocytes Relative: 7 %
Neutro Abs: 7.9 10*3/uL — ABNORMAL HIGH (ref 1.7–7.7)
Neutrophils Relative %: 79 %
Platelets: 204 10*3/uL (ref 150–400)
RBC: 3.9 MIL/uL — ABNORMAL LOW (ref 4.22–5.81)
RDW: 14.8 % (ref 11.5–15.5)
WBC: 9.9 10*3/uL (ref 4.0–10.5)
nRBC: 0 % (ref 0.0–0.2)

## 2021-06-03 LAB — COMPREHENSIVE METABOLIC PANEL
ALT: 8 U/L (ref 0–44)
ALT: 20 U/L (ref 0–44)
AST: 15 U/L (ref 15–41)
AST: 17 U/L (ref 15–41)
Albumin: 3.6 g/dL (ref 3.5–5.0)
Albumin: 3.8 g/dL (ref 3.5–5.0)
Alkaline Phosphatase: 110 U/L (ref 38–126)
Alkaline Phosphatase: 112 U/L (ref 38–126)
Anion gap: 5 (ref 5–15)
Anion gap: 6 (ref 5–15)
BUN: 16 mg/dL (ref 8–23)
BUN: 14 mg/dL (ref 8–23)
CO2: 32 mmol/L (ref 22–32)
CO2: 29 mmol/L (ref 22–32)
Calcium: 8.4 mg/dL — ABNORMAL LOW (ref 8.9–10.3)
Calcium: 8.7 mg/dL — ABNORMAL LOW (ref 8.9–10.3)
Chloride: 97 mmol/L — ABNORMAL LOW (ref 98–111)
Chloride: 98 mmol/L (ref 98–111)
Creatinine, Ser: 1.03 mg/dL (ref 0.61–1.24)
Creatinine, Ser: 0.89 mg/dL (ref 0.61–1.24)
GFR, Estimated: >60 mL/min (ref >60)
GFR, Estimated: >60 mL/min (ref >60)
Glucose, Bld: 253 mg/dL — ABNORMAL HIGH (ref 70–99)
Glucose, Bld: 206 mg/dL — ABNORMAL HIGH (ref 70–99)
Potassium: 4.2 mmol/L (ref 3.5–5.1)
Potassium: 3.9 mmol/L (ref 3.5–5.1)
Sodium: 134 mmol/L — ABNORMAL LOW (ref 135–145)
Sodium: 133 mmol/L — ABNORMAL LOW (ref 135–145)
Total Bilirubin: 0.4 mg/dL (ref 0.3–1.2)
Total Bilirubin: 0.8 mg/dL (ref 0.3–1.2)
Total Protein: 6.8 g/dL (ref 6.5–8.1)
Total Protein: 7.2 g/dL (ref 6.5–8.1)

## 2021-06-03 LAB — CBC
HCT: 34.3 % — ABNORMAL LOW (ref 39.0–52.0)
Hemoglobin: 11.4 g/dL — ABNORMAL LOW (ref 13.0–17.0)
MCH: 28.4 pg (ref 26.0–34.0)
MCHC: 33.2 g/dL (ref 30.0–36.0)
MCV: 85.5 fL (ref 80.0–100.0)
Platelets: 228 10*3/uL (ref 150–400)
RBC: 4.01 MIL/uL — ABNORMAL LOW (ref 4.22–5.81)
RDW: 14.6 % (ref 11.5–15.5)
WBC: 12.5 10*3/uL — ABNORMAL HIGH (ref 4.0–10.5)
nRBC: 0 % (ref 0.0–0.2)

## 2021-06-03 LAB — TROPONIN I (HIGH SENSITIVITY)
Troponin I (High Sensitivity): 10 ng/L (ref <18)
Troponin I (High Sensitivity): 14 ng/L (ref <18)

## 2021-06-03 LAB — VALPROIC ACID LEVEL: Valproic Acid Lvl: <10 ug/mL — ABNORMAL LOW (ref 50.0–100.0)

## 2021-06-03 LAB — CBG MONITORING, ED: Glucose-Capillary: 188 mg/dL — ABNORMAL HIGH (ref 70–99)

## 2021-06-03 NOTE — ED Notes (Signed)
Care giver from [facility] picked pt. Up.

## 2021-06-03 NOTE — ED Notes (Signed)
This nurse has attempted to call facility twice. Facility's number listed in pts. Chart is no longer in service and the number online is no longer in service.

## 2021-06-03 NOTE — ED Provider Notes (Addendum)
[facility] Provider Note   CSN: [id number] Arrival date & time: 06/03/21  1884     History Chief Complaint  Patient presents with   Loss of Consciousness    Jason [name] is a 70 y.o. male.   Loss of Consciousness  This patient is a 70 year old male with a known history of diabetes acid reflux hypertension paranoid schizophrenia prior stroke Tourette disorder and recent diagnosis of Parkinson's disease as well.  Of note the patient does have an implantable defibrillator which she states was placed in the last couple of years at a hospital in [place] though he cannot remember where nor what type of defibrillator or pacemaker he has.  He tells me that the pacemaker was placed because he was having some difficulty with passing out.  The patient states he passes out fairly frequently, he is in the process of being referred to neurology for this.  When I reviewed the medical record I can see that he has had multiple prior visits for both syncope, seizure-like activity as well as a myriad of other complaints.  He presents today after having a syncopal event that lasted several minutes at the nursing facility where he currently is, this is actually a group home that was witnessed by other people, staff members at the group home asked him to come to the hospital to be evaluated despite his refusal.  He initially said no but then eventually said that he was willing to be seen.  At this time he has absolutely no symptoms, no headache no chest pain no coughing no shortness of breath.  He states he feels a little swimmy headed before it happens but never has any chest pain or palpitations.  No fever, cough, sob, nausea, vomiting, diarrhea, rectal bleeding or other c/o - no new medicines.  Past Medical History:  Diagnosis Date   Diabetes mellitus without complication (HCC)    GERD (gastroesophageal reflux disease)    Hypertension    MI (myocardial infarction) ([place])     Paranoid schizophrenia ([place])    Stroke ([place])    Tourette disorder     Patient Active Problem List   Diagnosis Date Noted   Hx of myocardial infarction 02/26/2021   Impaired mobility 02/26/2021   Community acquired pneumonia 10/14/2018   Hypokalemia due to inadequate potassium intake 08/16/2018   Malingering 08/07/2018   Intellectual disability 08/07/2018   Paranoid schizophrenia ([place]) 07/23/2018   Severe episode of recurrent major depressive disorder, without psychotic features ([place]) 10/16/2017   Postural dizziness with presyncope 10/01/2017   Patient noncompliance 08/19/2017   Stasis ulcer ([place]) 08/19/2017   Venous stasis ulcer of right calf limited to breakdown of skin with varicose veins ([place]) 08/19/2017   Benign prostatic hyperplasia without lower urinary tract symptoms 08/12/2017   Closed fracture of anterior column of acetabulum with routine healing, left 03/28/2017   Disorder of bone density and structure, unspecified 03/27/2017   Hypogonadism in male 03/27/2017   At high risk for falls 03/04/2017   Cognitive communication deficit 03/04/2017   Senile osteoporosis 03/04/2017   Left arm pain 16/60/6301   Diastolic heart failure ([place]) 02/06/2017   HLD (hyperlipidemia) 02/06/2017   Nondisplaced fracture of anterior wall of left acetabulum ([place]) 02/06/2017   Syncope 02/06/2017   Developmental delay 12/26/2016   Obesity (BMI 30-39.9) 12/26/2016   Cellulitis of left lower extremity 08/29/2016   Slow transit constipation 06/24/2016   Skin lesion of left lower limb 06/13/2016   Episode of recurrent  major depressive disorder ([place]) 06/12/2016   Bulging of cervical intervertebral disc 12/14/2015   Hx of CABG 12/14/2015   OSA (obstructive sleep apnea) 12/14/2015   Type 2 diabetes mellitus with hyperglycemia ([place]) 12/14/2015   Elevated troponin 03/14/2015   Positive blood culture 03/14/2015   Chronic anticoagulation 03/13/2015   Morbid obesity due to excess calories ([place])  03/13/2015   Paroxysmal atrial fibrillation ([place]) 03/13/2015   Pleural effusion on left 03/13/2015   Schizoaffective disorder ([place]) 03/13/2015   SOB (shortness of breath) 03/13/2015   Borderline personality disorder ([place]) 04/22/2014   Anxiety 02/26/2014   Transient ischemic attack (TIA) 02/04/2014   Resting tremor 02/04/2014   Stroke-like symptoms 06/24/2013   Benign paroxysmal vertigo, unspecified ear 04/30/2013   Chest pain, unspecified 04/12/2013   Vitamin D deficiency, unspecified 06/03/2011   Disease of thyroid gland 03/04/2011   Essential hypertension 03/04/2011   Dysphonia of [place] Tourette's syndrome 09/10/2010   Generalized anxiety disorder 09/10/2010   Hearing loss 09/10/2010    Past Surgical History:  Procedure Laterality Date   CARDIAC SURGERY     CORONARY ARTERY BYPASS GRAFT     HAND SURGERY     TONSILLECTOMY         History reviewed. No pertinent family history.  Social History   Tobacco Use   Smoking status: Never   Smokeless tobacco: Never  Vaping Use   Vaping Use: Never used  Substance Use Topics   Alcohol use: Not Currently   Drug use: Not Currently    Home Medications Prior to Admission medications   Medication Sig Start Date End Date Taking? Authorizing Provider  acetaminophen (TYLENOL) 500 MG tablet Take 500 mg by mouth every 4 (four) hours as needed for mild pain or fever.     [provider]  albuterol (VENTOLIN HFA) 108 (90 Base) MCG/ACT inhaler Inhale into the lungs. 01/01/21   [provider]  amLODipine (NORVASC) 10 MG tablet Take 1 tablet (10 mg total) by mouth daily. Patient not taking: Reported on 04/02/2021 02/01/19   Elnora Morrison, MD  ARIPiprazole (ABILIFY) 5 MG tablet Take 5 mg by mouth daily. 02/05/21   [provider]  aspirin EC 325 MG tablet Take 1 tablet (325 mg total) by mouth daily. 02/01/19   Elnora Morrison, MD  atorvastatin (LIPITOR) 80 MG tablet Take 1 tablet (80 mg total) by mouth at  bedtime. 02/01/19   Elnora Morrison, MD  benztropine (COGENTIN) 0.5 MG tablet Take 1 tablet (0.5 mg total) by mouth 2 (two) times daily. 02/01/19   Elnora Morrison, MD  carbidopa-levodopa (SINEMET IR) 25-100 MG tablet Take 1 tablet by mouth in the morning and at bedtime. 01/31/21   [provider]  carvedilol (COREG) 6.25 MG tablet Take 1 tablet (6.25 mg total) by mouth 2 (two) times daily. 02/01/19   Elnora Morrison, MD  clopidogrel (PLAVIX) 75 MG tablet Take 1 tablet (75 mg total) by mouth daily. 02/01/19   Elnora Morrison, MD  divalproex (DEPAKOTE ER) 500 MG 24 hr tablet Take 2 tablets (1,000 mg total) by mouth daily before supper. At 1600 Patient not taking: Reported on 04/02/2021 02/01/19   Elnora Morrison, MD  ezetimibe (ZETIA) 10 MG tablet Take 1 tablet (10 mg total) by mouth daily. Patient not taking: Reported on 04/02/2021 02/01/19   Elnora Morrison, MD  furosemide (LASIX) 40 MG tablet Take 1 tablet (40 mg total) by mouth 2 (two) times daily. Patient taking differently: Take 30 mg by mouth daily. 02/01/19  Elnora Morrison, MD  gabapentin (NEURONTIN) 300 MG capsule Take 1 capsule (300 mg total) by mouth 3 (three) times daily. 02/01/19   Elnora Morrison, MD  glipiZIDE (GLUCOTROL) 5 MG tablet Take 1 tablet (5 mg total) by mouth daily. Patient not taking: No sig reported 02/01/19   Elnora Morrison, MD  insulin aspart (NOVOLOG) 100 UNIT/ML injection Inject 0-20 Units into the skin 3 (three) times daily with meals. Question Answer Comment Correction coverage: Resistant (obese, steroids)  CBG < 70: implement hypoglycemia protocol  CBG 70 - 120: 0 units  CBG 121 - 150: 3 units  CBG 151 - 200: 4 units  CBG 201 - 250: 7 units  CBG 251 - 300: 11 units  CBG 301 - 350: 15 units  CBG 351 - 400: 20 units  CBG > 400 call MD Patient taking differently: Inject 0-20 Units into the skin 3 (three) times daily with meals. CBG < 70: implement hypoglycemia protocol  CBG 70 - 150: 0 units  CBG 151 - 200: 2  units  CBG 201 - 250: 4 units  CBG 251 - 300: 6 units  CBG 301 - 350: 8 units  CBG 351 - 400: 11 units  CBG > 400= 13U 02/01/19   Elnora Morrison, MD  insulin degludec (TRESIBA) 100 UNIT/ML SOPN FlexTouch Pen Inject 0.3 mLs (30 Units total) into the skin at bedtime. Patient not taking: No sig reported 02/01/19   Elnora Morrison, MD  Insulin Glargine [facility]) 100 UNIT/ML Inject 80 Units into the skin daily. 02/05/21   [provider]  insulin lispro (HUMALOG) 100 UNIT/ML injection Inject 0.06 mLs (6 Units total) into the skin 3 (three) times daily with meals. Patient not taking: No sig reported 02/01/19   Elnora Morrison, MD  isosorbide mononitrate (IMDUR) 60 MG 24 hr tablet Take 1 tablet (60 mg total) by mouth daily. 02/01/19   Elnora Morrison, MD  LORazepam (ATIVAN) 0.5 MG tablet Take 1 tablet (0.5 mg total) by mouth 3 (three) times daily. Patient not taking: Reported on 04/02/2021 02/01/19   Elnora Morrison, MD  metFORMIN (GLUCOPHAGE) 1000 MG tablet Take 1 tablet (1,000 mg total) by mouth 2 (two) times daily. 02/01/19   Elnora Morrison, MD  metolazone (ZAROXOLYN) 2.5 MG tablet Take 1 tablet (2.5 mg total) by mouth daily. Patient not taking: Reported on 04/02/2021 02/01/19   Elnora Morrison, MD  nitroGLYCERIN (NITROSTAT) 0.4 MG SL tablet Place 1 tablet (0.4 mg total) under the tongue every 5 (five) minutes as needed for chest pain. 02/01/19   Elnora Morrison, MD  omeprazole (PRILOSEC) 20 MG capsule Take 1 capsule by mouth daily. 01/31/21   [provider]  Pimozide 1 MG TABS Take 1 tablet (1 mg total) by mouth 2 (two) times daily. 02/01/19   Elnora Morrison, MD  Potassium Chloride ER 20 MEQ TBCR Take 20 mEq by mouth 2 (two) times daily. Patient not taking: Reported on 04/02/2021 02/01/19   Elnora Morrison, MD  risperiDONE (RISPERDAL) 1 MG tablet Take 1.5 tablets (1.5 mg total) by mouth 2 (two) times daily. Patient not taking: Reported on 04/02/2021 02/01/19   Elnora Morrison, MD  tamsulosin  (FLOMAX) 0.4 MG CAPS capsule Take 1 capsule (0.4 mg total) by mouth daily. 02/01/19   Elnora Morrison, MD  VIMPAT 50 MG TABS tablet Take 100 mg by mouth 2 (two) times daily as needed. 02/06/21   [provider]  Vitamin D, Ergocalciferol, (DRISDOL) 1.25 MG (50000 UT) CAPS capsule Take 1 capsule (50,000  Units total) by mouth every Friday. Patient not taking: Reported on 04/02/2021 02/05/19   Elnora Morrison, MD    Allergies    Keflex [cephalexin]  Review of Systems   Review of Systems  Cardiovascular:  Positive for syncope.  All other systems reviewed and are negative.  Physical Exam Updated Vital Signs BP 127/70   Pulse 65   Temp 97.8 F (36.6 C) (Oral)   Resp 18   Ht 1.829 m (6')   Wt 122.5 kg   SpO2 100%   BMI 36.62 kg/m   Physical Exam Vitals and nursing note reviewed.  Constitutional:      General: He is not in acute distress.    Appearance: He is well-developed.  HENT:     Head: Normocephalic and atraumatic.     Mouth/Throat:     Pharynx: No oropharyngeal exudate.  Eyes:     General: No scleral icterus.       Right eye: No discharge.        Left eye: No discharge.     Conjunctiva/sclera: Conjunctivae normal.     Pupils: Pupils are equal, round, and reactive to light.  Neck:     Thyroid: No thyromegaly.     Vascular: No JVD.  Cardiovascular:     Rate and Rhythm: Normal rate and regular rhythm.     Heart sounds: Normal heart sounds. No murmur heard.   No friction rub. No gallop.  Pulmonary:     Effort: Pulmonary effort is normal. No respiratory distress.     Breath sounds: Normal breath sounds. No wheezing or rales.  Abdominal:     General: Bowel sounds are normal. There is no distension.     Palpations: Abdomen is soft. There is no mass.     Tenderness: There is no abdominal tenderness.  Musculoskeletal:        General: No tenderness. Normal range of motion.     Cervical back: Normal range of motion and neck supple.     Right lower leg: Edema present.      Left lower leg: Edema present.  Lymphadenopathy:     Cervical: No cervical adenopathy.  Skin:    General: Skin is warm and dry.     Findings: No erythema or rash.  Neurological:     Mental Status: He is alert.     Coordination: Coordination normal.     Comments: Wake alert and able to follow commands, he is able to grip with both hands, subtle left-sided facial droop which she states is chronic, speech is normal, he does have tremor bilateral upper extremities.  Psychiatric:        Behavior: Behavior normal.    ED Results / Procedures / Treatments   Labs (all labs ordered are listed, but only abnormal results are displayed) Labs Reviewed  VALPROIC ACID LEVEL - Abnormal; Notable for the following components:      Result Value   Valproic Acid Lvl <10 (*)    All other components within normal limits  CBC WITH DIFFERENTIAL/PLATELET - Abnormal; Notable for the following components:   RBC 3.90 (*)    Hemoglobin 11.1 (*)    HCT 34.1 (*)    Neutro Abs 7.9 (*)    All other components within normal limits  COMPREHENSIVE METABOLIC PANEL - Abnormal; Notable for the following components:   Sodium 134 (*)    Chloride 97 (*)    Glucose, Bld 253 (*)    Calcium 8.4 (*)    All  other components within normal limits  TROPONIN I (HIGH SENSITIVITY)    EKG EKG Interpretation  Date/Time:  Sunday June 03 2021 10:11:17 EDT Ventricular Rate:  67 PR Interval:    QRS Duration: 148 QT Interval:  440 QTC Calculation: 465 R Axis:   256 Text Interpretation: Paced rhythm  . IVCD, consider atypical RBBB Anterolateral infarct, age indeterminate Confirmed by Noemi Chapel [phone]) on 06/03/2021 11:02:09 AM  Radiology DG Chest 1 View  Result Date: 06/03/2021 CLINICAL DATA:  Pt in ED for multiple syncopal episodes. Hx of diabetes, htn, MI. Nonsmoker. EXAM: CHEST  1 VIEW COMPARISON:  04/08/2021 FINDINGS: Left subclavian pacemaker stable. Sternotomy wires. Lungs are clear. Heart size and mediastinal  contours are within normal limits. No effusion.  No pneumothorax. Visualized bones unremarkable. IMPRESSION: No acute cardiopulmonary disease. Electronically Signed   By: Lucrezia Europe M.D.   On: 06/03/2021 11:19   CT Head Wo Contrast  Result Date: 06/03/2021 CLINICAL DATA:  Syncopal episode. EXAM: CT HEAD WITHOUT CONTRAST TECHNIQUE: Contiguous axial images were obtained from the base of the skull through the vertex without intravenous contrast. COMPARISON:  Apr 08, 2021 FINDINGS: Brain: No evidence of acute infarction, hemorrhage, hydrocephalus, extra-axial collection or mass lesion/mass effect. Vascular: No hyperdense vessel or unexpected calcification. Skull: Normal. Negative for fracture or focal lesion. Sinuses/Orbits: No acute finding. Other: None. IMPRESSION: No acute intracranial abnormality. Electronically Signed   By: Fidela Salisbury M.D.   On: 06/03/2021 11:04    Procedures Procedures   Medications Ordered in ED Medications - No data to display  ED Course  I have reviewed the triage vital signs and the nursing notes.  Pertinent labs & imaging results that were available during my care of the patient were reviewed by me and considered in my medical decision making (see chart for details).    MDM Rules/Calculators/A&P                          This patient is otherwise well-appearing at this time with normal vital signs, will get a chest x-ray to try to figure out what kind of pacemaker he has, make sure it is working.  Based on EKG the pacemaker is working correctly, heart rate of 67 and a paced rhythm.  The patient is neurologically intact has had this happen multiple times, there is no definite etiology he is in the process of being referred to neurology.  The patient has had his pacemaker for less than a couple years and it is working appropriately today.  He has no symptoms at this time, he is not tachycardic and has no arrhythmia.  I have reviewed the medical record and the  patient has been seen at multiple facilities over time ultimately requiring MRI, the patient refuses transfer for MRI, he appears otherwise stable and wants to follow-up outpatient, he has medical decision-making capacity and appears stable at this time.  There is been no recurrent syncopal episodes here.  I discussed the patient with the crisis worker -they state that the patient has been sent back to the facility and the facility is not comfortable because they think that he may pass out again.  They are worried that he did not have the right test.  I in great detail in length went over all of the findings with the crisis worker, there is no signs of stroke, no signs of cardiac event, totally normal pacemaker function, the patient is well-appearing ambulated well across the  department many times with a stable gait which I personally visualized with my own eyes many times repeatedly and appears very well.  The crisis worker will take this to the care home and the patient will be able to be discharged   Final Clinical Impression(s) / ED Diagnoses Final diagnoses:  Syncope, unspecified syncope type    Rx / DC Orders ED Discharge Orders     None        Noemi Chapel, MD 06/03/21 Johnney Ou    Noemi Chapel, MD 06/03/21 1506

## 2021-06-03 NOTE — ED Triage Notes (Signed)
Per [facility] pt has had multiple syncopal episodes

## 2021-06-03 NOTE — ED Notes (Signed)
Pt. Ambulated with steady gait to restroom.

## 2021-06-03 NOTE — ED Notes (Signed)
Pt. Provided with drink.

## 2021-06-03 NOTE — ED Notes (Signed)
Pt. Requested to speak to someone at the facility. Provided phone for pt.

## 2021-06-03 NOTE — ED Notes (Addendum)
Updated pts. Contact information to represent a working phone number for [facility] facility.

## 2021-06-03 NOTE — ED Notes (Signed)
Pt. Ambulated to restroom with steady gait.  

## 2021-06-03 NOTE — ED Notes (Signed)
[name] is refusing to accept pt. Notified Dr. Hyacinth Meeker. Facility states that pt. Is having "stroke like symptoms". This nurse notified them that pt. Came in with syncopal episodes. This nurse notified them that our medical director, Dr. Hyacinth Meeker has medically cleared this pt. This nurse transferred the phone to Dr. Hyacinth Meeker.

## 2021-06-03 NOTE — ED Notes (Signed)
Pt. Ambulated from EMS truck into the hospital. Pt. Ambulated 300 ft to hallway bed.

## 2021-06-03 NOTE — ED Notes (Signed)
Pt. Provided with snack.

## 2021-06-03 NOTE — ED Notes (Signed)
Pt. Ambulating in room with steady gait.

## 2021-06-03 NOTE — ED Notes (Signed)
Pt. Is sitting in room chair waiting for EMS arrival.

## 2021-06-03 NOTE — ED Notes (Signed)
Called [facility] and spoke with Kathlene November. Kathlene November preceded to yell at this nurse and asking why nothing has been done. This nurse attempted to educate Kathlene November what has been done during the stay and that our medical director Dr. Hyacinth Meeker has medically cleared this pt. Kathlene November continued to yell at this nurse and than began speaking on another phone to someone else.

## 2021-06-03 NOTE — ED Triage Notes (Signed)
Pt in with co syncopal episodes states states for weeks, today has episode this am that last 8 min per care taker. Pt was seen at [facility] for the same and discharged home. Pt brought him directly from [facility], caretaker wants a concrete diagnosis.

## 2021-06-03 NOTE — ED Notes (Signed)
Per dr. Molli Knock for patient to have something to drink.  Patient given soft drink

## 2021-06-03 NOTE — Discharge Instructions (Addendum)
Your testing today did not show any specific abnormalities.  I would encourage you to follow-up with the neurologist that you have an appointment with.  Return to the emergency department for severe worsening symptoms

## 2021-06-03 NOTE — ED Provider Notes (Signed)
[facility] EMERGENCY DEPARTMENT Provider Note  CSN: [id number] Arrival date & time: 06/03/21 1510    History Chief Complaint  Patient presents with   See EDP notes    Jason [name] is a 70 y.o. male with history of recurrent syncope sent from group home to the ED earlier today for same. Had a negative workup and was discharged back to his facility. They refused to accept him and told EMT to bring him back to the ED. Dr. Hyacinth Meeker, who saw the patient earlier today, spoke with his Crisis Worker and explained that the patient did not have any abnormal findings or indication for admission. Patient is without complaint and does not want to be here.    Past Medical History:  Diagnosis Date   Diabetes mellitus without complication (HCC)    GERD (gastroesophageal reflux disease)    Hypertension    MI (myocardial infarction) (HCC)    Paranoid schizophrenia (HCC)    Stroke (HCC)    Tourette disorder     Past Surgical History:  Procedure Laterality Date   CARDIAC SURGERY     CORONARY ARTERY BYPASS GRAFT     HAND SURGERY     TONSILLECTOMY      History reviewed. No pertinent family history.  Social History   Tobacco Use   Smoking status: Never   Smokeless tobacco: Never  Vaping Use   Vaping Use: Never used  Substance Use Topics   Alcohol use: Not Currently   Drug use: Not Currently     Home Medications Prior to Admission medications   Medication Sig Start Date End Date Taking? Authorizing Provider  acetaminophen (TYLENOL) 500 MG tablet Take 500 mg by mouth every 4 (four) hours as needed for mild pain or fever.     [provider]  albuterol (VENTOLIN HFA) 108 (90 Base) MCG/ACT inhaler Inhale into the lungs. 01/01/21   [provider]  amLODipine (NORVASC) 10 MG tablet Take 1 tablet (10 mg total) by mouth daily. Patient not taking: Reported on 04/02/2021 02/01/19   Blane Ohara, MD  ARIPiprazole (ABILIFY) 5 MG tablet Take 5 mg by mouth daily. 02/05/21    [provider]  aspirin EC 325 MG tablet Take 1 tablet (325 mg total) by mouth daily. 02/01/19   Blane Ohara, MD  atorvastatin (LIPITOR) 80 MG tablet Take 1 tablet (80 mg total) by mouth at bedtime. 02/01/19   Blane Ohara, MD  benztropine (COGENTIN) 0.5 MG tablet Take 1 tablet (0.5 mg total) by mouth 2 (two) times daily. 02/01/19   Blane Ohara, MD  carbidopa-levodopa (SINEMET IR) 25-100 MG tablet Take 1 tablet by mouth in the morning and at bedtime. 01/31/21   [provider]  carvedilol (COREG) 6.25 MG tablet Take 1 tablet (6.25 mg total) by mouth 2 (two) times daily. 02/01/19   Blane Ohara, MD  clopidogrel (PLAVIX) 75 MG tablet Take 1 tablet (75 mg total) by mouth daily. 02/01/19   Blane Ohara, MD  divalproex (DEPAKOTE ER) 500 MG 24 hr tablet Take 2 tablets (1,000 mg total) by mouth daily before supper. At 1600 Patient not taking: Reported on 04/02/2021 02/01/19   Blane Ohara, MD  ezetimibe (ZETIA) 10 MG tablet Take 1 tablet (10 mg total) by mouth daily. Patient not taking: Reported on 04/02/2021 02/01/19   Blane Ohara, MD  furosemide (LASIX) 40 MG tablet Take 1 tablet (40 mg total) by mouth 2 (two) times daily. Patient taking differently: Take 30 mg by mouth daily. 02/01/19  Blane Ohara, MD  gabapentin (NEURONTIN) 300 MG capsule Take 1 capsule (300 mg total) by mouth 3 (three) times daily. 02/01/19   Blane Ohara, MD  glipiZIDE (GLUCOTROL) 5 MG tablet Take 1 tablet (5 mg total) by mouth daily. Patient not taking: No sig reported 02/01/19   Blane Ohara, MD  insulin aspart (NOVOLOG) 100 UNIT/ML injection Inject 0-20 Units into the skin 3 (three) times daily with meals. Question Answer Comment Correction coverage: Resistant (obese, steroids)  CBG < 70: implement hypoglycemia protocol  CBG 70 - 120: 0 units  CBG 121 - 150: 3 units  CBG 151 - 200: 4 units  CBG 201 - 250: 7 units  CBG 251 - 300: 11 units  CBG 301 - 350: 15 units  CBG 351 - 400: 20 units  CBG  > 400 call MD Patient taking differently: Inject 0-20 Units into the skin 3 (three) times daily with meals. CBG < 70: implement hypoglycemia protocol  CBG 70 - 150: 0 units  CBG 151 - 200: 2 units  CBG 201 - 250: 4 units  CBG 251 - 300: 6 units  CBG 301 - 350: 8 units  CBG 351 - 400: 11 units  CBG > 400= 13U 02/01/19   Blane Ohara, MD  insulin degludec (TRESIBA) 100 UNIT/ML SOPN FlexTouch Pen Inject 0.3 mLs (30 Units total) into the skin at bedtime. Patient not taking: No sig reported 02/01/19   Blane Ohara, MD  Insulin Glargine [facility]) 100 UNIT/ML Inject 80 Units into the skin daily. 02/05/21   [provider]  insulin lispro (HUMALOG) 100 UNIT/ML injection Inject 0.06 mLs (6 Units total) into the skin 3 (three) times daily with meals. Patient not taking: No sig reported 02/01/19   Blane Ohara, MD  isosorbide mononitrate (IMDUR) 60 MG 24 hr tablet Take 1 tablet (60 mg total) by mouth daily. 02/01/19   Blane Ohara, MD  LORazepam (ATIVAN) 0.5 MG tablet Take 1 tablet (0.5 mg total) by mouth 3 (three) times daily. Patient not taking: Reported on 04/02/2021 02/01/19   Blane Ohara, MD  metFORMIN (GLUCOPHAGE) 1000 MG tablet Take 1 tablet (1,000 mg total) by mouth 2 (two) times daily. 02/01/19   Blane Ohara, MD  metolazone (ZAROXOLYN) 2.5 MG tablet Take 1 tablet (2.5 mg total) by mouth daily. Patient not taking: Reported on 04/02/2021 02/01/19   Blane Ohara, MD  nitroGLYCERIN (NITROSTAT) 0.4 MG SL tablet Place 1 tablet (0.4 mg total) under the tongue every 5 (five) minutes as needed for chest pain. 02/01/19   Blane Ohara, MD  omeprazole (PRILOSEC) 20 MG capsule Take 1 capsule by mouth daily. 01/31/21   [provider]  Pimozide 1 MG TABS Take 1 tablet (1 mg total) by mouth 2 (two) times daily. 02/01/19   Blane Ohara, MD  Potassium Chloride ER 20 MEQ TBCR Take 20 mEq by mouth 2 (two) times daily. Patient not taking: Reported on 04/02/2021 02/01/19   Blane Ohara, MD  risperiDONE (RISPERDAL) 1 MG tablet Take 1.5 tablets (1.5 mg total) by mouth 2 (two) times daily. Patient not taking: Reported on 04/02/2021 02/01/19   Blane Ohara, MD  tamsulosin (FLOMAX) 0.4 MG CAPS capsule Take 1 capsule (0.4 mg total) by mouth daily. 02/01/19   Blane Ohara, MD  VIMPAT 50 MG TABS tablet Take 100 mg by mouth 2 (two) times daily as needed. 02/06/21   [provider]  Vitamin D, Ergocalciferol, (DRISDOL) 1.25 MG (50000 UT) CAPS capsule Take 1 capsule (50,000  Units total) by mouth every Friday. Patient not taking: Reported on 04/02/2021 02/05/19   Blane Ohara, MD     Allergies    Keflex [cephalexin]   Review of Systems   Review of Systems A comprehensive review of systems was completed and negative except as noted in HPI.    Physical Exam BP (!) 166/90 (BP Location: Right Arm)   Pulse 82   Temp 97.8 F (36.6 C) (Oral)   Resp 17   Ht 6' (1.829 m)   Wt 122.5 kg   SpO2 100%   BMI 36.63 kg/m   Physical Exam Vitals and nursing note reviewed.  HENT:     Head: Normocephalic.     Nose: Nose normal.  Eyes:     Extraocular Movements: Extraocular movements intact.  Pulmonary:     Effort: Pulmonary effort is normal.  Musculoskeletal:        General: Normal range of motion.     Cervical back: Neck supple.  Skin:    Findings: No rash (on exposed skin).  Neurological:     Mental Status: He is alert and oriented to person, place, and time.  Psychiatric:        Mood and Affect: Mood normal.     ED Results / Procedures / Treatments   Labs (all labs ordered are listed, but only abnormal results are displayed) Labs Reviewed - No data to display  EKG None  Radiology DG Chest 1 View  Result Date: 06/03/2021 CLINICAL DATA:  Pt in ED for multiple syncopal episodes. Hx of diabetes, htn, MI. Nonsmoker. EXAM: CHEST  1 VIEW COMPARISON:  04/08/2021 FINDINGS: Left subclavian pacemaker stable. Sternotomy wires. Lungs are clear. Heart size and  mediastinal contours are within normal limits. No effusion.  No pneumothorax. Visualized bones unremarkable. IMPRESSION: No acute cardiopulmonary disease. Electronically Signed   By: Corlis Leak M.D.   On: 06/03/2021 11:19   CT Head Wo Contrast  Result Date: 06/03/2021 CLINICAL DATA:  Syncopal episode. EXAM: CT HEAD WITHOUT CONTRAST TECHNIQUE: Contiguous axial images were obtained from the base of the skull through the vertex without intravenous contrast. COMPARISON:  Apr 08, 2021 FINDINGS: Brain: No evidence of acute infarction, hemorrhage, hydrocephalus, extra-axial collection or mass lesion/mass effect. Vascular: No hyperdense vessel or unexpected calcification. Skull: Normal. Negative for fracture or focal lesion. Sinuses/Orbits: No acute finding. Other: None. IMPRESSION: No acute intracranial abnormality. Electronically Signed   By: Ted Mcalpine M.D.   On: 06/03/2021 11:04    Procedures Procedures  Medications Ordered in the ED Medications - No data to display   MDM Rules/Calculators/A&P MDM Patient without any new complaints. ED workup already negative today. He has no complaints, does not want any further ED testing and will be returned to his group home.   ED Course  I have reviewed the triage vital signs and the nursing notes.  Pertinent labs & imaging results that were available during my care of the patient were reviewed by me and considered in my medical decision making (see chart for details).     Final Clinical Impression(s) / ED Diagnoses Final diagnoses:  Syncope, unspecified syncope type    Rx / DC Orders ED Discharge Orders     None        Pollyann Savoy, MD 06/03/21 1614

## 2021-06-03 NOTE — ED Notes (Signed)
Pt. Provided with meal. 

## 2021-06-03 NOTE — ED Notes (Signed)
Pt. Provided with meal tray

## 2021-06-04 ENCOUNTER — Emergency Department
Admission: EM | Admit: 2021-06-04 | Discharge: 2021-06-04 | Disposition: A | Discharge status: Home / Self Care | Payer: Medicare HMO | Attending: Emergency Medicine | Admitting: Emergency Medicine

## 2021-06-04 DIAGNOSIS — R55 Syncope and collapse: Secondary | ICD-10-CM

## 2021-06-04 NOTE — Discharge Instructions (Addendum)
As we discussed, Jason [name] evaluation was once again reassuring and unfortunately we cannot give you a specific reason for his episodes of passing out and falling.  It is possible that he is having an issue where his heartbeat and blood pressure are not being adequately regulated and sometimes, for no particular reason, his blood pressure drops causing him to pass out.  It is also possible that there is an underlying neurological issue.  We strongly recommend that you follow-up with neurology as an outpatient as previously recommended and referred.  He may also benefit from follow-up with cardiology, either with his established cardiologist or a cardiologist near you to whom he can be referred by his primary care provider.  Given his ongoing issues, he may also benefit from further evaluation at a major Medical Center, such as [facility], [facility], [facility], or [place].  At this time, however, there is no indication of an emergent medical condition, though we understand it is possible that his continued falls may eventually lead to injury.  Please try to take whatever steps you feel are appropriate to avoid situations where falls and injury may be unlikely.  Please follow-up with the recommended providers.

## 2021-06-04 NOTE — ED Provider Notes (Signed)
[facility] Emergency Department Provider Note  ____________________________________________   Event Date/Time   First MD Initiated Contact with Patient 06/04/21 0132     (approximate)  I have reviewed the triage vital signs and the nursing notes.   HISTORY  Chief Complaint Loss of Consciousness  Level 5 caveat: History is limited by the patient's chronic intellectual disability.  History is mostly provided by his caregiver from the group home who acts as his legal representative.  HPI Jason [name] is a 70 y.o. male with medical history as listed below who presents with his caregiver from his group home for evaluation of multiple syncopal episodes.  The patient was seen twice earlier today at [facility] for the same issue and was medically cleared.  The caregiver is concerned because he feels that something is being missed and that the patient needs additional care that cannot be provided at the assisted living facility/group home.  The patient says he feels fine.  He is aware that he is having these episodes where he is lightheaded and dizzy and falls and/or passes out.  Reportedly this morning he had an episode where he was "out of it" for a few minutes.  These episodes reportedly go back for months at least and he has been seen multiple times at multiple hospitals including [facility] and [facility].  His caregiver is frustrated because they are continually told that there is nothing else that can be done but he is worried that the patient will be injured with his persistent falls.  The patient has a pacemaker and he says that this helped him for a while a few years ago but the similar episodes but now they are happening again.  Nothing in particular seems to cause the episodes and they can happen when he is ambulating, sitting, or lying down.  He has no pain including chest pain and he has no shortness of breath.  He has no focal weakness or numbness and  sometimes he is confused afterwards.  No visible seizure activity.  The symptoms are gradual in onset and have been going on at least 4 months but overall they have been going on for years because that is why he he got a pacemaker in the first place.     Past Medical History:  Diagnosis Date   Diabetes mellitus without complication (HCC)    GERD (gastroesophageal reflux disease)    Hypertension    MI (myocardial infarction) (HCC)    Paranoid schizophrenia (HCC)    Stroke (HCC)    Tourette disorder     Patient Active Problem List   Diagnosis Date Noted   Hx of myocardial infarction 02/26/2021   Impaired mobility 02/26/2021   Community acquired pneumonia 10/14/2018   Hypokalemia due to inadequate potassium intake 08/16/2018   Malingering 08/07/2018   Intellectual disability 08/07/2018   Paranoid schizophrenia (HCC) 07/23/2018   Severe episode of recurrent major depressive disorder, without psychotic features (HCC) 10/16/2017   Postural dizziness with presyncope 10/01/2017   Patient noncompliance 08/19/2017   Stasis ulcer (HCC) 08/19/2017   Venous stasis ulcer of right calf limited to breakdown of skin with varicose veins (HCC) 08/19/2017   Benign prostatic hyperplasia without lower urinary tract symptoms 08/12/2017   Closed fracture of anterior column of acetabulum with routine healing, left 03/28/2017   Disorder of bone density and structure, unspecified 03/27/2017   Hypogonadism in male 03/27/2017   At high risk for falls 03/04/2017   Cognitive communication deficit  03/04/2017   Senile osteoporosis 03/04/2017   Left arm pain 02/16/2017   Diastolic heart failure (HCC) 02/06/2017   HLD (hyperlipidemia) 02/06/2017   Nondisplaced fracture of anterior wall of left acetabulum (HCC) 02/06/2017   Syncope 02/06/2017   Developmental delay 12/26/2016   Obesity (BMI 30-39.9) 12/26/2016   Cellulitis of left lower extremity 08/29/2016   Slow transit constipation 06/24/2016   Skin  lesion of left lower limb 06/13/2016   Episode of recurrent major depressive disorder (HCC) 06/12/2016   Bulging of cervical intervertebral disc 12/14/2015   Hx of CABG 12/14/2015   OSA (obstructive sleep apnea) 12/14/2015   Type 2 diabetes mellitus with hyperglycemia (HCC) 12/14/2015   Elevated troponin 03/14/2015   Positive blood culture 03/14/2015   Chronic anticoagulation 03/13/2015   Morbid obesity due to excess calories (HCC) 03/13/2015   Paroxysmal atrial fibrillation (HCC) 03/13/2015   Pleural effusion on left 03/13/2015   Schizoaffective disorder (HCC) 03/13/2015   SOB (shortness of breath) 03/13/2015   Borderline personality disorder (HCC) 04/22/2014   Anxiety 02/26/2014   Transient ischemic attack (TIA) 02/04/2014   Resting tremor 02/04/2014   Stroke-like symptoms 06/24/2013   Benign paroxysmal vertigo, unspecified ear 04/30/2013   Chest pain, unspecified 04/12/2013   Vitamin D deficiency, unspecified 06/03/2011   Disease of thyroid gland 03/04/2011   Essential hypertension 03/04/2011   Dysphonia of [place] Tourette's syndrome 09/10/2010   Generalized anxiety disorder 09/10/2010   Hearing loss 09/10/2010    Past Surgical History:  Procedure Laterality Date   CARDIAC SURGERY     CORONARY ARTERY BYPASS GRAFT     HAND SURGERY     TONSILLECTOMY      Prior to Admission medications   Medication Sig Start Date End Date Taking? Authorizing Provider  acetaminophen (TYLENOL) 500 MG tablet Take 500 mg by mouth every 4 (four) hours as needed for mild pain or fever.     [provider]  albuterol (VENTOLIN HFA) 108 (90 Base) MCG/ACT inhaler Inhale into the lungs. 01/01/21   [provider]  amLODipine (NORVASC) 10 MG tablet Take 1 tablet (10 mg total) by mouth daily. Patient not taking: Reported on 04/02/2021 02/01/19   Blane OharaZavitz, Joshua, MD  ARIPiprazole (ABILIFY) 5 MG tablet Take 5 mg by mouth daily. 02/05/21   [provider]  aspirin EC 325 MG  tablet Take 1 tablet (325 mg total) by mouth daily. 02/01/19   Blane OharaZavitz, Joshua, MD  atorvastatin (LIPITOR) 80 MG tablet Take 1 tablet (80 mg total) by mouth at bedtime. 02/01/19   Blane OharaZavitz, Joshua, MD  benztropine (COGENTIN) 0.5 MG tablet Take 1 tablet (0.5 mg total) by mouth 2 (two) times daily. 02/01/19   Blane OharaZavitz, Joshua, MD  carbidopa-levodopa (SINEMET IR) 25-100 MG tablet Take 1 tablet by mouth in the morning and at bedtime. 01/31/21   [provider]  carvedilol (COREG) 6.25 MG tablet Take 1 tablet (6.25 mg total) by mouth 2 (two) times daily. 02/01/19   Blane OharaZavitz, Joshua, MD  clopidogrel (PLAVIX) 75 MG tablet Take 1 tablet (75 mg total) by mouth daily. 02/01/19   Blane OharaZavitz, Joshua, MD  divalproex (DEPAKOTE ER) 500 MG 24 hr tablet Take 2 tablets (1,000 mg total) by mouth daily before supper. At 1600 Patient not taking: Reported on 04/02/2021 02/01/19   Blane OharaZavitz, Joshua, MD  ezetimibe (ZETIA) 10 MG tablet Take 1 tablet (10 mg total) by mouth daily. Patient not taking: Reported on 04/02/2021 02/01/19   Blane OharaZavitz, Joshua, MD  furosemide (LASIX) 40 MG  tablet Take 1 tablet (40 mg total) by mouth 2 (two) times daily. Patient taking differently: Take 30 mg by mouth daily. 02/01/19   Blane Ohara, MD  gabapentin (NEURONTIN) 300 MG capsule Take 1 capsule (300 mg total) by mouth 3 (three) times daily. 02/01/19   Blane Ohara, MD  glipiZIDE (GLUCOTROL) 5 MG tablet Take 1 tablet (5 mg total) by mouth daily. Patient not taking: No sig reported 02/01/19   Blane Ohara, MD  insulin aspart (NOVOLOG) 100 UNIT/ML injection Inject 0-20 Units into the skin 3 (three) times daily with meals. Question Answer Comment Correction coverage: Resistant (obese, steroids)  CBG < 70: implement hypoglycemia protocol  CBG 70 - 120: 0 units  CBG 121 - 150: 3 units  CBG 151 - 200: 4 units  CBG 201 - 250: 7 units  CBG 251 - 300: 11 units  CBG 301 - 350: 15 units  CBG 351 - 400: 20 units  CBG > 400 call MD Patient taking differently:  Inject 0-20 Units into the skin 3 (three) times daily with meals. CBG < 70: implement hypoglycemia protocol  CBG 70 - 150: 0 units  CBG 151 - 200: 2 units  CBG 201 - 250: 4 units  CBG 251 - 300: 6 units  CBG 301 - 350: 8 units  CBG 351 - 400: 11 units  CBG > 400= 13U 02/01/19   Blane Ohara, MD  insulin degludec (TRESIBA) 100 UNIT/ML SOPN FlexTouch Pen Inject 0.3 mLs (30 Units total) into the skin at bedtime. Patient not taking: No sig reported 02/01/19   Blane Ohara, MD  Insulin Glargine [facility]) 100 UNIT/ML Inject 80 Units into the skin daily. 02/05/21   [provider]  insulin lispro (HUMALOG) 100 UNIT/ML injection Inject 0.06 mLs (6 Units total) into the skin 3 (three) times daily with meals. Patient not taking: No sig reported 02/01/19   Blane Ohara, MD  isosorbide mononitrate (IMDUR) 60 MG 24 hr tablet Take 1 tablet (60 mg total) by mouth daily. 02/01/19   Blane Ohara, MD  LORazepam (ATIVAN) 0.5 MG tablet Take 1 tablet (0.5 mg total) by mouth 3 (three) times daily. Patient not taking: Reported on 04/02/2021 02/01/19   Blane Ohara, MD  metFORMIN (GLUCOPHAGE) 1000 MG tablet Take 1 tablet (1,000 mg total) by mouth 2 (two) times daily. 02/01/19   Blane Ohara, MD  metolazone (ZAROXOLYN) 2.5 MG tablet Take 1 tablet (2.5 mg total) by mouth daily. Patient not taking: Reported on 04/02/2021 02/01/19   Blane Ohara, MD  nitroGLYCERIN (NITROSTAT) 0.4 MG SL tablet Place 1 tablet (0.4 mg total) under the tongue every 5 (five) minutes as needed for chest pain. 02/01/19   Blane Ohara, MD  omeprazole (PRILOSEC) 20 MG capsule Take 1 capsule by mouth daily. 01/31/21   [provider]  Pimozide 1 MG TABS Take 1 tablet (1 mg total) by mouth 2 (two) times daily. 02/01/19   Blane Ohara, MD  Potassium Chloride ER 20 MEQ TBCR Take 20 mEq by mouth 2 (two) times daily. Patient not taking: Reported on 04/02/2021 02/01/19   Blane Ohara, MD  risperiDONE (RISPERDAL) 1 MG  tablet Take 1.5 tablets (1.5 mg total) by mouth 2 (two) times daily. Patient not taking: Reported on 04/02/2021 02/01/19   Blane Ohara, MD  tamsulosin (FLOMAX) 0.4 MG CAPS capsule Take 1 capsule (0.4 mg total) by mouth daily. 02/01/19   Blane Ohara, MD  VIMPAT 50 MG TABS tablet Take 100 mg by mouth 2 (two)  times daily as needed. 02/06/21   [provider]  Vitamin D, Ergocalciferol, (DRISDOL) 1.25 MG (50000 UT) CAPS capsule Take 1 capsule (50,000 Units total) by mouth every Friday. Patient not taking: Reported on 04/02/2021 02/05/19   Blane Ohara, MD    Allergies Keflex [cephalexin]  No family history on file.  Social History Social History   Tobacco Use   Smoking status: Never   Smokeless tobacco: Never  Vaping Use   Vaping Use: Never used  Substance Use Topics   Alcohol use: Not Currently   Drug use: Not Currently    Review of Systems Level 5 caveat: History is limited by the patient's chronic intellectual disability.  History is mostly provided by his caregiver from the group home who acts as his legal representative.  Constitutional: No fever/chills Eyes: No visual changes. ENT: No sore throat. Cardiovascular: Positive for recurrent syncopal episodes.  Denies chest pain. Respiratory: Denies shortness of breath. Gastrointestinal: No abdominal pain.  No nausea, no vomiting.  No diarrhea.  No constipation. Genitourinary: Negative for dysuria. Musculoskeletal: Negative for neck pain.  Negative for back pain. Integumentary: Negative for rash. Neurological: Negative for headaches, focal weakness or numbness.  ____________________________________________   PHYSICAL EXAM:  VITAL SIGNS: ED Triage Vitals  Enc Vitals Group     BP 06/03/21 2320 (!) 175/84     Pulse Rate 06/03/21 2320 72     Resp 06/03/21 2320 20     Temp 06/03/21 2320 98.2 F (36.8 C)     Temp Source 06/03/21 2320 Oral     SpO2 06/03/21 2320 100 %     Weight 06/03/21 2321 122.5 kg (270 lb 1  oz)     Height --      Head Circumference --      Peak Flow --      Pain Score 06/03/21 2321 0     Pain Loc --      Pain Edu? --      Excl. in GC? --     Constitutional: Alert and oriented, at his baseline according to his caregiver. Eyes: Conjunctivae are normal.  Head: Atraumatic. Nose: No congestion/rhinnorhea. Mouth/Throat: Patient is wearing a mask. Neck: No stridor.  No meningeal signs.   Cardiovascular: Normal rate, regular rhythm. Good peripheral circulation. Respiratory: Normal respiratory effort.  No retractions.  Lungs are clear upon auscultation. Gastrointestinal: Soft and nontender. No distention.  Musculoskeletal: No lower extremity tenderness nor edema. No gross deformities of extremities. Neurologic: Chronic hearing loss.  Normal speech and language. No gross focal neurologic deficits are appreciated.  Ambulatory without difficulty. Skin:  Skin is warm, dry and intact. Psychiatric: Mood and affect are pleasant and appropriate, calm and cooperative, at his baseline per caregiver.  ____________________________________________   LABS (all labs ordered are listed, but only abnormal results are displayed)  Labs Reviewed  CBC - Abnormal; Notable for the following components:      Result Value   WBC 12.5 (*)    RBC 4.01 (*)    Hemoglobin 11.4 (*)    HCT 34.3 (*)    All other components within normal limits  COMPREHENSIVE METABOLIC PANEL - Abnormal; Notable for the following components:   Sodium 133 (*)    Glucose, Bld 206 (*)    Calcium 8.7 (*)    All other components within normal limits  TROPONIN I (HIGH SENSITIVITY)   ____________________________________________  EKG  ED ECG REPORT I, Loleta Rose, the attending physician, personally viewed and interpreted this ECG.  Date: 06/03/2021 EKG Time: 23: 22 Rate: 73 Rhythm: Paced rhythm QRS Axis: normal Intervals: normal ST/T Wave abnormalities: Non-specific ST segment / T-wave changes, but no clear  evidence of acute ischemia. Narrative Interpretation: no definitive evidence of acute ischemia; does not meet STEMI criteria.  ____________________________________________   INITIAL IMPRESSION / MDM / ASSESSMENT AND PLAN / ED COURSE  As part of my medical decision making, I reviewed the following data within the electronic MEDICAL RECORD NUMBER History obtained from family, Nursing notes reviewed and incorporated, Labs reviewed , EKG reviewed, old EKG reviewed, old chart reviewed, and Notes from prior ED visits   Differential diagnosis includes, but is not limited to, nonspecific syncope, cardiac arrhythmia, sick sinus syndrome, tachybradycardia syndrome, seizure activity, CVA, worsening Parkinson disease or parkinsonian syndrome, other nonspecific neurological issue, medication or drug side effect.  I reviewed the medical record extensively including his prior ED visits from today but also going back for several months.  I also reviewed his imaging including a recent head CT and chest x-ray.  He has a paced EKG rhythm and is not having any chest pain.  His labs including metabolic panel and CBC remained stable.  His vital signs are stable and he is normotensive.  He is not orthostatic and is able to ambulate without any difficulty.  I had an extensive conversation with his caregiver and acknowledges caregivers frustration but reiterated that I do not think an MRI would be beneficial even if he can tolerate it but the patient is claustrophobic and has a pacemaker which dramatically limits the options for getting MRI of his brain.  It does not sound to me as if he is having TIAs or strokes, but rather either has an unspecified neurological issue or is having cardiogenic causes of syncope.  Either way he has had extensive evaluation and it does not seem to be emergent or acutely dangerous, although I acknowledged to the caregiver that obviously if the patient falls he can sustain injury.  Unfortunately at  this time he does not meet inpatient criteria and there is nothing else that we have to offer at this facility.  The patient's caregiver acknowledged the situation.  He said that they have been referred to a neurologist but that the patient's legal guardian is reluctant to have him taken to the neurologist for unclear reasons.  I strongly encouraged him to do so as well as to seek care with a local cardiologist, most likely by the primary care provider.  The patient is in good spirits at the time of discharge and the caregiver understands the need for additional outpatient follow-up.  I gave my usual and customary follow-up recommendations and return precautions.           ____________________________________________  FINAL CLINICAL IMPRESSION(S) / ED DIAGNOSES  Final diagnoses:  Syncope and collapse     MEDICATIONS GIVEN DURING THIS VISIT:  Medications - No data to display   ED Discharge Orders     None        Note:  This document was prepared using Dragon voice recognition software and may include unintentional dictation errors.   Loleta Rose, MD 06/04/21 [phone]

## 2021-06-06 DIAGNOSIS — H25813 Combined forms of age-related cataract, bilateral: Secondary | ICD-10-CM | POA: Diagnosis not present

## 2021-06-06 DIAGNOSIS — E119 Type 2 diabetes mellitus without complications: Secondary | ICD-10-CM | POA: Diagnosis not present

## 2021-06-06 DIAGNOSIS — Z7984 Long term (current) use of oral hypoglycemic drugs: Secondary | ICD-10-CM | POA: Diagnosis not present

## 2021-06-06 DIAGNOSIS — Z794 Long term (current) use of insulin: Secondary | ICD-10-CM | POA: Diagnosis not present

## 2021-06-08 DIAGNOSIS — Z950 Presence of cardiac pacemaker: Secondary | ICD-10-CM | POA: Diagnosis not present

## 2021-06-08 DIAGNOSIS — F445 Conversion disorder with seizures or convulsions: Secondary | ICD-10-CM | POA: Diagnosis not present

## 2021-06-08 DIAGNOSIS — E1140 Type 2 diabetes mellitus with diabetic neuropathy, unspecified: Secondary | ICD-10-CM | POA: Diagnosis not present

## 2021-06-08 DIAGNOSIS — I509 Heart failure, unspecified: Secondary | ICD-10-CM | POA: Diagnosis not present

## 2021-06-08 DIAGNOSIS — I10 Essential (primary) hypertension: Secondary | ICD-10-CM | POA: Diagnosis not present

## 2021-06-15 DIAGNOSIS — F331 Major depressive disorder, recurrent, moderate: Secondary | ICD-10-CM | POA: Diagnosis not present

## 2021-06-15 DIAGNOSIS — F4312 Post-traumatic stress disorder, chronic: Secondary | ICD-10-CM | POA: Diagnosis not present

## 2021-07-02 ENCOUNTER — Encounter: Payer: Self-pay | Admitting: Cardiology

## 2021-08-03 ENCOUNTER — Emergency Department (HOSPITAL_COMMUNITY)
Admission: EM | Admit: 2021-08-03 | Discharge: 2021-08-03 | Disposition: A | Discharge status: Home / Self Care | Payer: 59 | Attending: Emergency Medicine | Admitting: Emergency Medicine

## 2021-08-03 ENCOUNTER — Encounter (HOSPITAL_COMMUNITY): Payer: Self-pay | Admitting: *Deleted

## 2021-08-03 DIAGNOSIS — Z7982 Long term (current) use of aspirin: Secondary | ICD-10-CM | POA: Diagnosis not present

## 2021-08-03 DIAGNOSIS — I110 Hypertensive heart disease with heart failure: Secondary | ICD-10-CM | POA: Diagnosis not present

## 2021-08-03 DIAGNOSIS — S99922A Unspecified injury of left foot, initial encounter: Secondary | ICD-10-CM | POA: Diagnosis present

## 2021-08-03 DIAGNOSIS — Z7984 Long term (current) use of oral hypoglycemic drugs: Secondary | ICD-10-CM | POA: Diagnosis not present

## 2021-08-03 DIAGNOSIS — E119 Type 2 diabetes mellitus without complications: Secondary | ICD-10-CM | POA: Insufficient documentation

## 2021-08-03 DIAGNOSIS — Z794 Long term (current) use of insulin: Secondary | ICD-10-CM | POA: Diagnosis not present

## 2021-08-03 DIAGNOSIS — Z7902 Long term (current) use of antithrombotics/antiplatelets: Secondary | ICD-10-CM | POA: Insufficient documentation

## 2021-08-03 DIAGNOSIS — S90415A Abrasion, left lesser toe(s), initial encounter: Secondary | ICD-10-CM | POA: Insufficient documentation

## 2021-08-03 DIAGNOSIS — I5030 Unspecified diastolic (congestive) heart failure: Secondary | ICD-10-CM | POA: Diagnosis not present

## 2021-08-03 DIAGNOSIS — Z951 Presence of aortocoronary bypass graft: Secondary | ICD-10-CM | POA: Insufficient documentation

## 2021-08-03 DIAGNOSIS — Z79899 Other long term (current) drug therapy: Secondary | ICD-10-CM | POA: Insufficient documentation

## 2021-08-03 DIAGNOSIS — X58XXXA Exposure to other specified factors, initial encounter: Secondary | ICD-10-CM | POA: Insufficient documentation

## 2021-08-03 DIAGNOSIS — F88 Other disorders of psychological development: Secondary | ICD-10-CM | POA: Diagnosis not present

## 2021-08-03 NOTE — ED Provider Notes (Signed)
[facility] EMERGENCY DEPARTMENT Provider Note   CSN: [id number] Arrival date & time: 08/03/21  1208     History Chief Complaint  Patient presents with   Foot Pain    Jason [name] is a 70 y.o. male with history of T2DM, and schizophrenia who presents to ED from assisted living for sore on left 2nd toe. Has hx of chronic sores on both lower legs. Reports no pain, no numbness or changes in sensation. No injuries to feet. No difficulty ambulating.    Foot Pain      Past Medical History:  Diagnosis Date   Diabetes mellitus without complication (HCC)    GERD (gastroesophageal reflux disease)    Hypertension    MI (myocardial infarction) (HCC)    Paranoid schizophrenia (HCC)    Stroke (HCC)    Tourette disorder     Patient Active Problem List   Diagnosis Date Noted   Hx of myocardial infarction 02/26/2021   Impaired mobility 02/26/2021   Community acquired pneumonia 10/14/2018   Hypokalemia due to inadequate potassium intake 08/16/2018   Malingering 08/07/2018   Intellectual disability 08/07/2018   Paranoid schizophrenia (HCC) 07/23/2018   Severe episode of recurrent major depressive disorder, without psychotic features (HCC) 10/16/2017   Postural dizziness with presyncope 10/01/2017   Patient noncompliance 08/19/2017   Stasis ulcer (HCC) 08/19/2017   Venous stasis ulcer of right calf limited to breakdown of skin with varicose veins (HCC) 08/19/2017   Benign prostatic hyperplasia without lower urinary tract symptoms 08/12/2017   Closed fracture of anterior column of acetabulum with routine healing, left 03/28/2017   Disorder of bone density and structure, unspecified 03/27/2017   Hypogonadism in male 03/27/2017   At high risk for falls 03/04/2017   Cognitive communication deficit 03/04/2017   Senile osteoporosis 03/04/2017   Left arm pain 02/16/2017   Diastolic heart failure (HCC) 02/06/2017   HLD (hyperlipidemia) 02/06/2017   Nondisplaced fracture of anterior  wall of left acetabulum (HCC) 02/06/2017   Syncope 02/06/2017   Developmental delay 12/26/2016   Obesity (BMI 30-39.9) 12/26/2016   Cellulitis of left lower extremity 08/29/2016   Slow transit constipation 06/24/2016   Skin lesion of left lower limb 06/13/2016   Episode of recurrent major depressive disorder (HCC) 06/12/2016   Bulging of cervical intervertebral disc 12/14/2015   Hx of CABG 12/14/2015   OSA (obstructive sleep apnea) 12/14/2015   Type 2 diabetes mellitus with hyperglycemia (HCC) 12/14/2015   Elevated troponin 03/14/2015   Positive blood culture 03/14/2015   Chronic anticoagulation 03/13/2015   Morbid obesity due to excess calories (HCC) 03/13/2015   Paroxysmal atrial fibrillation (HCC) 03/13/2015   Pleural effusion on left 03/13/2015   Schizoaffective disorder (HCC) 03/13/2015   SOB (shortness of breath) 03/13/2015   Borderline personality disorder (HCC) 04/22/2014   Anxiety 02/26/2014   Transient ischemic attack (TIA) 02/04/2014   Resting tremor 02/04/2014   Stroke-like symptoms 06/24/2013   Benign paroxysmal vertigo, unspecified ear 04/30/2013   Chest pain, unspecified 04/12/2013   Vitamin D deficiency, unspecified 06/03/2011   Disease of thyroid gland 03/04/2011   Essential hypertension 03/04/2011   Dysphonia of [place] Tourette's syndrome 09/10/2010   Generalized anxiety disorder 09/10/2010   Hearing loss 09/10/2010    Past Surgical History:  Procedure Laterality Date   CARDIAC SURGERY     CORONARY ARTERY BYPASS GRAFT     HAND SURGERY     TONSILLECTOMY         No family history on file.  Social History   Tobacco Use   Smoking status: Never   Smokeless tobacco: Never  Vaping Use   Vaping Use: Never used  Substance Use Topics   Alcohol use: Not Currently   Drug use: Not Currently    Home Medications Prior to Admission medications   Medication Sig Start Date End Date Taking? Authorizing Provider  acetaminophen (TYLENOL) 500 MG tablet  Take 500 mg by mouth every 4 (four) hours as needed for mild pain or fever.     [provider]  albuterol (VENTOLIN HFA) 108 (90 Base) MCG/ACT inhaler Inhale into the lungs. 01/01/21   [provider]  amLODipine (NORVASC) 10 MG tablet Take 1 tablet (10 mg total) by mouth daily. Patient not taking: Reported on 04/02/2021 02/01/19   Blane Ohara, MD  ARIPiprazole (ABILIFY) 5 MG tablet Take 5 mg by mouth daily. 02/05/21   [provider]  aspirin EC 325 MG tablet Take 1 tablet (325 mg total) by mouth daily. 02/01/19   Blane Ohara, MD  atorvastatin (LIPITOR) 80 MG tablet Take 1 tablet (80 mg total) by mouth at bedtime. 02/01/19   Blane Ohara, MD  benztropine (COGENTIN) 0.5 MG tablet Take 1 tablet (0.5 mg total) by mouth 2 (two) times daily. 02/01/19   Blane Ohara, MD  carbidopa-levodopa (SINEMET IR) 25-100 MG tablet Take 1 tablet by mouth in the morning and at bedtime. 01/31/21   [provider]  carvedilol (COREG) 6.25 MG tablet Take 1 tablet (6.25 mg total) by mouth 2 (two) times daily. 02/01/19   Blane Ohara, MD  clopidogrel (PLAVIX) 75 MG tablet Take 1 tablet (75 mg total) by mouth daily. 02/01/19   Blane Ohara, MD  divalproex (DEPAKOTE ER) 500 MG 24 hr tablet Take 2 tablets (1,000 mg total) by mouth daily before supper. At 1600 Patient not taking: Reported on 04/02/2021 02/01/19   Blane Ohara, MD  ezetimibe (ZETIA) 10 MG tablet Take 1 tablet (10 mg total) by mouth daily. Patient not taking: Reported on 04/02/2021 02/01/19   Blane Ohara, MD  furosemide (LASIX) 40 MG tablet Take 1 tablet (40 mg total) by mouth 2 (two) times daily. Patient taking differently: Take 30 mg by mouth daily. 02/01/19   Blane Ohara, MD  gabapentin (NEURONTIN) 300 MG capsule Take 1 capsule (300 mg total) by mouth 3 (three) times daily. 02/01/19   Blane Ohara, MD  glipiZIDE (GLUCOTROL) 5 MG tablet Take 1 tablet (5 mg total) by mouth daily. Patient not taking: No sig reported  02/01/19   Blane Ohara, MD  insulin aspart (NOVOLOG) 100 UNIT/ML injection Inject 0-20 Units into the skin 3 (three) times daily with meals. Question Answer Comment Correction coverage: Resistant (obese, steroids)  CBG < 70: implement hypoglycemia protocol  CBG 70 - 120: 0 units  CBG 121 - 150: 3 units  CBG 151 - 200: 4 units  CBG 201 - 250: 7 units  CBG 251 - 300: 11 units  CBG 301 - 350: 15 units  CBG 351 - 400: 20 units  CBG > 400 call MD Patient taking differently: Inject 0-20 Units into the skin 3 (three) times daily with meals. CBG < 70: implement hypoglycemia protocol  CBG 70 - 150: 0 units  CBG 151 - 200: 2 units  CBG 201 - 250: 4 units  CBG 251 - 300: 6 units  CBG 301 - 350: 8 units  CBG 351 - 400: 11 units  CBG > 400= 13U 02/01/19   Blane Ohara, MD  insulin degludec (TRESIBA) 100 UNIT/ML SOPN FlexTouch Pen Inject 0.3 mLs (30 Units total) into the skin at bedtime. Patient not taking: No sig reported 02/01/19   Blane Ohara, MD  Insulin Glargine [facility]) 100 UNIT/ML Inject 80 Units into the skin daily. 02/05/21   [provider]  insulin lispro (HUMALOG) 100 UNIT/ML injection Inject 0.06 mLs (6 Units total) into the skin 3 (three) times daily with meals. Patient not taking: No sig reported 02/01/19   Blane Ohara, MD  isosorbide mononitrate (IMDUR) 60 MG 24 hr tablet Take 1 tablet (60 mg total) by mouth daily. 02/01/19   Blane Ohara, MD  LORazepam (ATIVAN) 0.5 MG tablet Take 1 tablet (0.5 mg total) by mouth 3 (three) times daily. Patient not taking: Reported on 04/02/2021 02/01/19   Blane Ohara, MD  metFORMIN (GLUCOPHAGE) 1000 MG tablet Take 1 tablet (1,000 mg total) by mouth 2 (two) times daily. 02/01/19   Blane Ohara, MD  metolazone (ZAROXOLYN) 2.5 MG tablet Take 1 tablet (2.5 mg total) by mouth daily. Patient not taking: Reported on 04/02/2021 02/01/19   Blane Ohara, MD  nitroGLYCERIN (NITROSTAT) 0.4 MG SL tablet Place 1 tablet (0.4 mg total)  under the tongue every 5 (five) minutes as needed for chest pain. 02/01/19   Blane Ohara, MD  omeprazole (PRILOSEC) 20 MG capsule Take 1 capsule by mouth daily. 01/31/21   [provider]  Pimozide 1 MG TABS Take 1 tablet (1 mg total) by mouth 2 (two) times daily. 02/01/19   Blane Ohara, MD  Potassium Chloride ER 20 MEQ TBCR Take 20 mEq by mouth 2 (two) times daily. Patient not taking: Reported on 04/02/2021 02/01/19   Blane Ohara, MD  risperiDONE (RISPERDAL) 1 MG tablet Take 1.5 tablets (1.5 mg total) by mouth 2 (two) times daily. Patient not taking: Reported on 04/02/2021 02/01/19   Blane Ohara, MD  tamsulosin (FLOMAX) 0.4 MG CAPS capsule Take 1 capsule (0.4 mg total) by mouth daily. 02/01/19   Blane Ohara, MD  VIMPAT 50 MG TABS tablet Take 100 mg by mouth 2 (two) times daily as needed. 02/06/21   [provider]  Vitamin D, Ergocalciferol, (DRISDOL) 1.25 MG (50000 UT) CAPS capsule Take 1 capsule (50,000 Units total) by mouth every Friday. Patient not taking: Reported on 04/02/2021 02/05/19   Blane Ohara, MD    Allergies    Keflex [cephalexin]  Review of Systems   Review of Systems  Skin:  Positive for wound.       Sore on left 2nd toe  All other systems reviewed and are negative.  Physical Exam Updated Vital Signs BP (!) 151/76   Pulse 68   Temp 98 F (36.7 C)   Resp (!) 22   Wt 119.2 kg   SpO2 100%   BMI 35.64 kg/m   Physical Exam Vitals and nursing note reviewed.  Constitutional:      Appearance: Normal appearance.  HENT:     Head: Normocephalic and atraumatic.  Eyes:     Conjunctiva/sclera: Conjunctivae normal.  Pulmonary:     Effort: Pulmonary effort is normal. No respiratory distress.  Musculoskeletal:     Comments: Chronic skin changes and scattered wounds on bilateral LE. No evidence of overlying infection. Superficial abrasion on left 2nd toe with small hematoma . Nail in tact. Pulses normal, capillary refill normal. Sensation in tact  bilaterally. Full ROM of toes without pain.   Skin:    General: Skin is warm and dry.  Neurological:  Mental Status: He is alert.  Psychiatric:        Mood and Affect: Mood normal.        Behavior: Behavior normal.    ED Results / Procedures / Treatments   Labs (all labs ordered are listed, but only abnormal results are displayed) Labs Reviewed - No data to display  EKG None  Radiology No results found.  Procedures Procedures   Medications Ordered in ED Medications - No data to display  ED Course  I have reviewed the triage vital signs and the nursing notes.  Pertinent labs & imaging results that were available during my care of the patient were reviewed by me and considered in my medical decision making (see chart for details).    MDM Rules/Calculators/A&P                           Patient is 70 y/o male who presents with sore on left 2nd toe. On exam patient has chronic bilateral lower leg skin changes with several open superficial sores. No evidence of overlying infection. No injuries.   Attempted pressure irrigation with saline of abrasion. No deeper structures visualized. Abrasion not requiring sutures. No imaging is warranted at this time. Discussed importance of following up with podiatrist for chronic management of foot sores. Patient agreeable to plan.   Final Clinical Impression(s) / ED Diagnoses Final diagnoses:  Abrasion of toe of left foot, initial encounter    Rx / DC Orders ED Discharge Orders     None        Jeanella Flattery 08/03/21 1509    Bethann Berkshire, MD 08/06/21 1651

## 2021-08-03 NOTE — ED Notes (Signed)
Pt noted with multiple open sores on lower legs bilaterally, but comes in today for tx for second toe on left side that has open abrasion/sore. He states that it has been this way for 2 weeks and continues to get worse. Pt believes it is from wearing his shoes without sock on but he refuses to wear socks because his "feet get too hot in them". He has two open areas to his calves bilaterally and states he just keeps hitting them on various things. Mild 1+ edema noted to ankles bilaterally, skin red and warm to touch to BLEs. Pedal pulses equal and strong. Pt states that he will only allow blood to be drawn via butterfly needle and states " I dont take any IVs and if I need and IV I am leaving." Nurse attempted to explain importance of IV access if he had an infection or further complications. Pt continues to refuse placement.

## 2021-08-03 NOTE — Discharge Instructions (Addendum)
I do not see anything on your toe today that needs sutures or further repair. I also don't see any evidence of infection requiring medication.  I think it would benefit you to see a podiatrist, I have included the contact information for one. They can help you manage your chronic sores as well as toe hygiene.

## 2021-08-03 NOTE — ED Triage Notes (Signed)
Here for evaluation of left 2nd toe, has sores on both lower legs, sent from [facility] group home by EMS

## 2021-08-15 ENCOUNTER — Encounter: Payer: Self-pay | Admitting: *Deleted

## 2021-08-16 ENCOUNTER — Encounter: Payer: Self-pay | Admitting: *Deleted

## 2021-08-16 ENCOUNTER — Ambulatory Visit (INDEPENDENT_AMBULATORY_CARE_PROVIDER_SITE_OTHER): Payer: 59 | Admitting: Cardiology

## 2021-08-16 ENCOUNTER — Encounter: Payer: Self-pay | Admitting: Cardiology

## 2021-08-16 VITALS — BP 126/68 | HR 63 | Ht 69.0 in | Wt 264.0 lb

## 2021-08-16 DIAGNOSIS — Z950 Presence of cardiac pacemaker: Secondary | ICD-10-CM | POA: Diagnosis not present

## 2021-08-16 DIAGNOSIS — I25119 Atherosclerotic heart disease of native coronary artery with unspecified angina pectoris: Secondary | ICD-10-CM

## 2021-08-16 DIAGNOSIS — R001 Bradycardia, unspecified: Secondary | ICD-10-CM | POA: Diagnosis not present

## 2021-08-16 MED ORDER — ISOSORBIDE MONONITRATE ER 60 MG PO TB24: 90.0000 mg | ORAL_TABLET | Freq: Every day | ORAL | 0 refills | Status: AC

## 2021-08-16 NOTE — Progress Notes (Signed)
Clinical Summary Jason [name] is a 70 y.o.male seen today as a new cosnult, referred by Dr Lilian Kapur for the following medical problems.   Previously followed by cardiology at [facility]. THere are also notes from [facility] and [place] cardiology. Appears cardiac care acrocss multiple medical centers.    CAD -notes mention history of ischemic heart disease and prior MIs, stents - prior CABG x 4 in 2012   2018 echo [facility]: LVEF >55%, grade I dd,   From prior notes  4v CABG 2012 at [facility] lima-lad, 'jump' svg-om1-d1, svg-om2 (at that time Pt had 90 lad, 85 d1, 80 Cx, 100 om1, 80 om2) presented with chest pain and NSTEMI on 06/2016 at [place]. LHC at that time revealed 100 mlad, 80 om2, 100 om1, 40 diffuse rca; lima and one svg were patent. Pt underwent BMS PCI of native om2.       - has had some intermittent chest pains - episodes every few weeks. Sharp/dull pain mid/left chest. Can occur at rest or with activity. No oother associated symptoms. Can last 1 to 1.5 hours. Not positiona - compliant with meds   2.Pacemaker -placed in [place] in 2020 per his report. Cardiology notes in care everywhere only go up to 2019, I do not have any info on this - he is not sure what type of pacemaker.   3.Chronic schizophrenia  4. Chronic diastolic HF  2018 echo [facility]: LVEF >55%, grade I dd,  - compliant with diuretics.   Past Medical History:  Diagnosis Date   Diabetes mellitus without complication (HCC)    GERD (gastroesophageal reflux disease)    Hypertension    MI (myocardial infarction) (HCC)    Paranoid schizophrenia (HCC)    Stroke (HCC)    Tourette disorder      Allergies  Allergen Reactions   Keflex [Cephalexin] Rash     Current Outpatient Medications  Medication Sig Dispense Refill   acetaminophen (TYLENOL) 500 MG tablet Take 500 mg by mouth every 4 (four) hours as needed for mild pain or fever.      albuterol (VENTOLIN HFA) 108 (90 Base) MCG/ACT inhaler Inhale  into the lungs.     amLODipine (NORVASC) 10 MG tablet Take 1 tablet (10 mg total) by mouth daily. (Patient not taking: Reported on 04/02/2021) 30 tablet 0   ARIPiprazole (ABILIFY) 5 MG tablet Take 5 mg by mouth daily.     aspirin EC 325 MG tablet Take 1 tablet (325 mg total) by mouth daily. 30 tablet 0   atorvastatin (LIPITOR) 80 MG tablet Take 1 tablet (80 mg total) by mouth at bedtime. 30 tablet 0   benztropine (COGENTIN) 0.5 MG tablet Take 1 tablet (0.5 mg total) by mouth 2 (two) times daily. 60 tablet 0   carbidopa-levodopa (SINEMET IR) 25-100 MG tablet Take 1 tablet by mouth in the morning and at bedtime.     carvedilol (COREG) 6.25 MG tablet Take 1 tablet (6.25 mg total) by mouth 2 (two) times daily. 60 tablet 0   clopidogrel (PLAVIX) 75 MG tablet Take 1 tablet (75 mg total) by mouth daily. 30 tablet 0   divalproex (DEPAKOTE ER) 500 MG 24 hr tablet Take 2 tablets (1,000 mg total) by mouth daily before supper. At 1600 (Patient not taking: Reported on 04/02/2021) 60 tablet 0   ezetimibe (ZETIA) 10 MG tablet Take 1 tablet (10 mg total) by mouth daily. (Patient not taking: Reported on 04/02/2021) 30 tablet 0   furosemide (LASIX) 40 MG  tablet Take 1 tablet (40 mg total) by mouth 2 (two) times daily. (Patient taking differently: Take 30 mg by mouth daily.) 60 tablet 1   gabapentin (NEURONTIN) 300 MG capsule Take 1 capsule (300 mg total) by mouth 3 (three) times daily. 90 capsule 0   glipiZIDE (GLUCOTROL) 5 MG tablet Take 1 tablet (5 mg total) by mouth daily. (Patient not taking: No sig reported) 30 tablet 0   insulin aspart (NOVOLOG) 100 UNIT/ML injection Inject 0-20 Units into the skin 3 (three) times daily with meals. Question Answer Comment Correction coverage: Resistant (obese, steroids)  CBG < 70: implement hypoglycemia protocol  CBG 70 - 120: 0 units  CBG 121 - 150: 3 units  CBG 151 - 200: 4 units  CBG 201 - 250: 7 units  CBG 251 - 300: 11 units  CBG 301 - 350: 15 units  CBG 351 - 400: 20  units  CBG > 400 call MD (Patient taking differently: Inject 0-20 Units into the skin 3 (three) times daily with meals. CBG < 70: implement hypoglycemia protocol  CBG 70 - 150: 0 units  CBG 151 - 200: 2 units  CBG 201 - 250: 4 units  CBG 251 - 300: 6 units  CBG 301 - 350: 8 units  CBG 351 - 400: 11 units  CBG > 400= 13U) 10 mL 1   insulin degludec (TRESIBA) 100 UNIT/ML SOPN FlexTouch Pen Inject 0.3 mLs (30 Units total) into the skin at bedtime. (Patient not taking: No sig reported) 9 mL 0   Insulin Glargine (BASAGLAR KWIKPEN) 100 UNIT/ML Inject 80 Units into the skin daily.     insulin lispro (HUMALOG) 100 UNIT/ML injection Inject 0.06 mLs (6 Units total) into the skin 3 (three) times daily with meals. (Patient not taking: No sig reported) 6 mL 0   isosorbide mononitrate (IMDUR) 60 MG 24 hr tablet Take 1 tablet (60 mg total) by mouth daily. 30 tablet 0   LORazepam (ATIVAN) 0.5 MG tablet Take 1 tablet (0.5 mg total) by mouth 3 (three) times daily. (Patient not taking: Reported on 04/02/2021) 90 tablet 0   metFORMIN (GLUCOPHAGE) 1000 MG tablet Take 1 tablet (1,000 mg total) by mouth 2 (two) times daily. 30 tablet 0   metolazone (ZAROXOLYN) 2.5 MG tablet Take 1 tablet (2.5 mg total) by mouth daily. (Patient not taking: Reported on 04/02/2021) 30 tablet 0   nitroGLYCERIN (NITROSTAT) 0.4 MG SL tablet Place 1 tablet (0.4 mg total) under the tongue every 5 (five) minutes as needed for chest pain. 10 tablet 0   omeprazole (PRILOSEC) 20 MG capsule Take 1 capsule by mouth daily.     Pimozide 1 MG TABS Take 1 tablet (1 mg total) by mouth 2 (two) times daily. 60 tablet 0   Potassium Chloride ER 20 MEQ TBCR Take 20 mEq by mouth 2 (two) times daily. (Patient not taking: Reported on 04/02/2021) 60 tablet 0   risperiDONE (RISPERDAL) 1 MG tablet Take 1.5 tablets (1.5 mg total) by mouth 2 (two) times daily. (Patient not taking: Reported on 04/02/2021) 90 tablet 0   tamsulosin (FLOMAX) 0.4 MG CAPS capsule Take 1  capsule (0.4 mg total) by mouth daily. 30 capsule 0   VIMPAT 50 MG TABS tablet Take 100 mg by mouth 2 (two) times daily as needed.     Vitamin D, Ergocalciferol, (DRISDOL) 1.25 MG (50000 UT) CAPS capsule Take 1 capsule (50,000 Units total) by mouth every Friday. (Patient not taking: Reported on 04/02/2021) 30 capsule  0   No current facility-administered medications for this visit.     Past Surgical History:  Procedure Laterality Date   CARDIAC SURGERY     CORONARY ARTERY BYPASS GRAFT     HAND SURGERY     TONSILLECTOMY       Allergies  Allergen Reactions   Keflex [Cephalexin] Rash      No family history on file.   Social History Jason [name] reports that he has never smoked. He has never used smokeless tobacco. Jason [name] reports that he does not currently use alcohol.   Review of Systems CONSTITUTIONAL: No weight loss, fever, chills, weakness or fatigue.  HEENT: Eyes: No visual loss, blurred vision, double vision or yellow sclerae.No hearing loss, sneezing, congestion, runny nose or sore throat.  SKIN: No rash or itching.  CARDIOVASCULAR: per hpi RESPIRATORY: No shortness of breath, cough or sputum.  GASTROINTESTINAL: No anorexia, nausea, vomiting or diarrhea. No abdominal pain or blood.  GENITOURINARY: No burning on urination, no polyuria NEUROLOGICAL: No headache, dizziness, syncope, paralysis, ataxia, numbness or tingling in the extremities. No change in bowel or bladder control.  MUSCULOSKELETAL: No muscle, back pain, joint pain or stiffness.  LYMPHATICS: No enlarged nodes. No history of splenectomy.  PSYCHIATRIC: No history of depression or anxiety.  ENDOCRINOLOGIC: No reports of sweating, cold or heat intolerance. No polyuria or polydipsia.  Marland Kitchen   Physical Examination Today's Vitals   08/16/21 0922  BP: 126/68  Pulse: 63  SpO2: 99%  Weight: 264 lb (119.7 kg)  Height: 5\' 9"  (1.753 m)   Body mass index is 38.99 kg/m.  Gen: resting comfortably, no acute  distress HEENT: no scleral icterus, pupils equal round and reactive, no palptable cervical adenopathy,  CV: RRR, no m/r/g, no jvd Resp: Clear to auscultation bilaterally GI: abdomen is soft, non-tender, non-distended, normal bowel sounds, no hepatosplenomegaly MSK: extremities are warm, no edema.  Skin: warm, no rash Neuro:  no focal deficits Psych: appropriate affect      Assessment and Plan  1.CAD -long complex history, partly because cardiac care scattered over several medical centers - recent chest pain sympomts - he reports he would never want another stent. Given this no reason to pursue stress testing. Work with antianginal therapy - increase imdur to 90mg  daily  2. Pacemaker - unknown history, he reports placed in 2020 in [place]. We will try to request records. Pacemaker is right sided - have him establish with device clinic.   3. Chronic diastolic HF - appears euvolemic, continue diuretics    2021, M.D.

## 2021-08-16 NOTE — Patient Instructions (Addendum)
Medication Instructions:  Increase Imdur to 90mg  daily.  Continue all other medications.     Labwork: none  Testing/Procedures: none  Follow-Up: 2 months   Any Other Special Instructions Will Be Listed Below (If Applicable). Establish with device clinic.  If you need a refill on your cardiac medications before your next appointment, please call your pharmacy.

## 2021-09-11 ENCOUNTER — Encounter: Payer: 59 | Admitting: Internal Medicine

## 2021-09-25 ENCOUNTER — Encounter: Payer: 59 | Admitting: Internal Medicine

## 2021-09-26 ENCOUNTER — Encounter: Payer: Self-pay | Admitting: Internal Medicine

## 2021-10-02 ENCOUNTER — Encounter: Payer: Self-pay | Admitting: Neurology

## 2021-10-02 ENCOUNTER — Ambulatory Visit (INDEPENDENT_AMBULATORY_CARE_PROVIDER_SITE_OTHER): Payer: 59 | Admitting: Neurology

## 2021-10-02 VITALS — BP 174/88 | HR 120 | Ht 69.0 in | Wt 260.0 lb

## 2021-10-02 DIAGNOSIS — R569 Unspecified convulsions: Secondary | ICD-10-CM | POA: Diagnosis not present

## 2021-10-02 DIAGNOSIS — R251 Tremor, unspecified: Secondary | ICD-10-CM | POA: Diagnosis not present

## 2021-10-02 DIAGNOSIS — Z8673 Personal history of transient ischemic attack (TIA), and cerebral infarction without residual deficits: Secondary | ICD-10-CM | POA: Diagnosis not present

## 2021-10-02 DIAGNOSIS — Z87898 Personal history of other specified conditions: Secondary | ICD-10-CM

## 2021-10-02 NOTE — Progress Notes (Signed)
[facility] [address] [place]. [place] [address] [phone]       OFFICE CONSULT NOTE  Mr. JAQUAVION LEFTRIDGE Date of Birth:  September 30, 1951 Medical Record Number:  [id number]   Referring MD: Dyke Brackett  Reason for Referral: Establish neurological care  HPI: Mr. Hershner is a 70 year old Caucasian male his past medical history of coronary artery disease, pacemaker, chronic schizophrenia, chronic diastolic heart failure, diabetes, hypertension, MI, Tourette disorder, questionable Parkinson disease and seizures, tremors, frequent falls.  He has recently moved to [facility] area in March 2022 and is here to establish neurological care.  He informs me that he was diagnosed as with seizures and saw a neurologist in [facility] but is unable to give me the name or address it with last visit being 12 years ago.  Is also been diagnosed with Parkinson's and has been on Sinemet 25/101 tablet twice daily.  His most disability is his tremors which he states is present at rest but for the last 3 to 4 years has been increasing in is disabling and he has trouble holding objects and writing and eating because of it.  Is tolerating current dose of Sinemet well without side effects.  He denies any drooling of saliva, bradykinesia, gait festination, stooped posture.  He has had multiple episodes of near syncope and has had extensive cardiac evaluation including a previous MI and pacemaker insertion.  He is is also recently seen Dr. Carlyle Dolly cardiologist in [place] to establish cardiac care.  He states he cannot remember when he has his last seizure but he is currently on Depakote and not sure if it is for his mood or for seizures.  In the past he states he has had a couple of episodes of strokes.  He had a large 1 5 years ago which affected left side of his body and he was hospitalized for several days but recovered well.  2 years ago he states he had a light stroke involving  the right side of the body and was admitted in [place] for a few days.  Review of electronic medical records in [place] shows that he had a telemetry neurology consult on 01/29/2017 at [facility] for left-sided face arm and leg numbness and had negative CT scan of the head and he was claustrophobic and refused an MRI.  Another neuro follow-up visit at [place] on 07/08/2013 by Dava Najjar nurse practitioner shows that he had recurrent episodes of right sided numbness which were felt to be probably functional.  CT head was again negative.  I cannot find records of 3 CT scans in his electronic medical records of the head all of which were unremarkable except for mild atrophy.  There is no MRI report to be found.  Patient takes Plavix for cardiac and stroke prevention and is tolerating well without bruising or bleeding.  He has had no recurrent recent TIA or stroke symptoms of chest pain.  He is also on Lipitor which is tolerating well without muscle aches and pains.  He also has diabetes and blood pressure and blood pressure is not well controlled today and it is 174/88.  ROS:   14 system review of systems is positive for tremors, frequent falls, imbalance, syncopal episodes, numbness all other systems negative  PMH:  Past Medical History:  Diagnosis Date   Diabetes mellitus without complication ([place])    GERD (gastroesophageal reflux disease)    Hypertension    MI (myocardial infarction) ([place])  Paranoid schizophrenia ([place])    Stroke ([place])    Tourette disorder     Social History:  Social History   Socioeconomic History   Marital status: Single    Spouse name: Not on file   Number of children: Not on file   Years of education: Not on file   Highest education level: Not on file  Occupational History   Occupation: Disabled  Tobacco Use   Smoking status: Never   Smokeless tobacco: Never  Vaping Use   Vaping Use: Never used  Substance and Sexual Activity    Alcohol use: Not Currently   Drug use: Not Currently   Sexual activity: Not Currently  Other Topics Concern   Not on file  Social History Narrative   On disability; lives at [place] home      Social Determinants of Health   Financial Resource Strain: Not on file  Food Insecurity: Not on file  Transportation Needs: Not on file  Physical Activity: Not on file  Stress: Not on file  Social Connections: Not on file  Intimate Partner Violence: Not on file    Medications:   Current Outpatient Medications on File Prior to Visit  Medication Sig Dispense Refill   acetaminophen (TYLENOL) 500 MG tablet Take 500 mg by mouth every 4 (four) hours as needed for mild pain or fever.      albuterol (VENTOLIN HFA) 108 (90 Base) MCG/ACT inhaler Inhale into the lungs.     amLODipine (NORVASC) 10 MG tablet Take 1 tablet (10 mg total) by mouth daily. 30 tablet 0   ARIPiprazole (ABILIFY) 5 MG tablet Take 5 mg by mouth daily.     aspirin EC 325 MG tablet Take 1 tablet (325 mg total) by mouth daily. 30 tablet 0   atorvastatin (LIPITOR) 80 MG tablet Take 1 tablet (80 mg total) by mouth at bedtime. 30 tablet 0   benztropine (COGENTIN) 0.5 MG tablet Take 1 tablet (0.5 mg total) by mouth 2 (two) times daily. 60 tablet 0   carbidopa-levodopa (SINEMET IR) 25-100 MG tablet Take 1 tablet by mouth 3 (three) times daily.     carvedilol (COREG) 6.25 MG tablet Take 1 tablet (6.25 mg total) by mouth 2 (two) times daily. 60 tablet 0   clopidogrel (PLAVIX) 75 MG tablet Take 1 tablet (75 mg total) by mouth daily. 30 tablet 0   divalproex (DEPAKOTE ER) 500 MG 24 hr tablet Take 2 tablets (1,000 mg total) by mouth daily before supper. At 1600 60 tablet 0   ezetimibe (ZETIA) 10 MG tablet Take 1 tablet (10 mg total) by mouth daily. 30 tablet 0   furosemide (LASIX) 40 MG tablet Take 1 tablet (40 mg total) by mouth 2 (two) times daily. (Patient taking differently: Take 30 mg by mouth daily.) 60 tablet 1   gabapentin  (NEURONTIN) 300 MG capsule Take 1 capsule (300 mg total) by mouth 3 (three) times daily. 90 capsule 0   glipiZIDE (GLUCOTROL) 5 MG tablet Take 1 tablet (5 mg total) by mouth daily. 30 tablet 0   insulin aspart (NOVOLOG) 100 UNIT/ML injection Inject 0-20 Units into the skin 3 (three) times daily with meals. Question Answer Comment Correction coverage: Resistant (obese, steroids)  CBG < 70: implement hypoglycemia protocol  CBG 70 - 120: 0 units  CBG 121 - 150: 3 units  CBG 151 - 200: 4 units  CBG 201 - 250: 7 units  CBG 251 - 300: 11 units  CBG 301 -  350: 15 units  CBG 351 - 400: 20 units  CBG > 400 call MD (Patient taking differently: Inject 0-20 Units into the skin 3 (three) times daily with meals. CBG < 70: implement hypoglycemia protocol  CBG 70 - 150: 0 units  CBG 151 - 200: 2 units  CBG 201 - 250: 4 units  CBG 251 - 300: 6 units  CBG 301 - 350: 8 units  CBG 351 - 400: 11 units  CBG > 400= 13U) 10 mL 1   insulin degludec (TRESIBA) 100 UNIT/ML SOPN FlexTouch Pen Inject 0.3 mLs (30 Units total) into the skin at bedtime. 9 mL 0   Insulin Glargine (BASAGLAR KWIKPEN) 100 UNIT/ML Inject 80 Units into the skin daily.     insulin lispro (HUMALOG) 100 UNIT/ML injection Inject 0.06 mLs (6 Units total) into the skin 3 (three) times daily with meals. 6 mL 0   isosorbide mononitrate (IMDUR) 60 MG 24 hr tablet Take 1.5 tablets (90 mg total) by mouth daily. 45 tablet 0   LORazepam (ATIVAN) 0.5 MG tablet Take 1 tablet (0.5 mg total) by mouth 3 (three) times daily. 90 tablet 0   metFORMIN (GLUCOPHAGE) 1000 MG tablet Take 1 tablet (1,000 mg total) by mouth 2 (two) times daily. 30 tablet 0   metolazone (ZAROXOLYN) 2.5 MG tablet Take 1 tablet (2.5 mg total) by mouth daily. 30 tablet 0   nitroGLYCERIN (NITROSTAT) 0.4 MG SL tablet Place 1 tablet (0.4 mg total) under the tongue every 5 (five) minutes as needed for chest pain. 10 tablet 0   omeprazole (PRILOSEC) 20 MG capsule Take 1 capsule by mouth daily.      Pimozide 1 MG TABS Take 1 tablet (1 mg total) by mouth 2 (two) times daily. 60 tablet 0   Potassium Chloride ER 20 MEQ TBCR Take 20 mEq by mouth 2 (two) times daily. 60 tablet 0   risperiDONE (RISPERDAL) 1 MG tablet Take 1.5 tablets (1.5 mg total) by mouth 2 (two) times daily. 90 tablet 0   tamsulosin (FLOMAX) 0.4 MG CAPS capsule Take 1 capsule (0.4 mg total) by mouth daily. 30 capsule 0   VIMPAT 50 MG TABS tablet Take 100 mg by mouth 2 (two) times daily as needed.     Vitamin D, Ergocalciferol, (DRISDOL) 1.25 MG (50000 UT) CAPS capsule Take 1 capsule (50,000 Units total) by mouth every Friday. 30 capsule 0   No current facility-administered medications on file prior to visit.    Allergies:   Allergies  Allergen Reactions   Cephalosporins Hives and Rash    Pt is unaware of this allergy.   Keflex [Cephalexin] Rash    Physical Exam General: Obese middle-aged Caucasian male seated, in no evident distress Head: head normocephalic and atraumatic.   Neck: supple with no carotid or supraclavicular bruits Cardiovascular: regular rate and rhythm, no murmurs Musculoskeletal: no deformity Skin:  no rash/petichiae Vascular:  Normal pulses all extremities  Neurologic Exam Mental Status: Awake and fully alert. Oriented to place and time. Recent and remote memory intact. Attention span, concentration and fund of knowledge appropriate. Mood and affect appropriate.  Glabellar tap is negative.  Good facial expression. Cranial Nerves: Fundoscopic exam reveals sharp disc margins. Pupils equal, briskly reactive to light. Extraocular movements full without nystagmus. Visual fields full to confrontation. Hearing intact. Facial sensation intact. Face, tongue, palate moves normally and symmetrically.  Motor: Normal bulk and tone. Normal strength in all tested extremity muscles.  Resting bilateral hand coarse tremor which worsens  with position holding and intention.  Mild cogwheel rigidity at both wrist right  greater than left with activation only.  No bradykinesia. Sensory.: intact to touch , pinprick , position and vibratory sensation.  Coordination: Rapid alternating movements normal in all extremities. Finger-to-nose and heel-to-shin performed accurately bilaterally. Gait and Station: Arises from chair without difficulty. Stance is normal.  No stooped posture.  No festination or retropulsion.  No diminished arm swing while walking.  Gait demonstrates normal stride length and balance . Able to heel, toe and tandem walk without difficulty.  Reflexes: 1+ and symmetric. Toes downgoing.   NIHSS  0 Modified Rankin  1   ASSESSMENT: 70 year old Caucasian male with longstanding resting and action tremors in the upper extremities likely multifactorial due to combination of long-term antipsychotic usage and mild drug-induced parkinsonism.  Doubt essential tremor.  He lacks typical features of Parkinson's disease.  Remote history of seizures but not well-documented in past medical history.  History of frequent falls and multiple syncopal episodes with negative cardiac work-up.  History of episodes of transient numbness felt to be TIAs with negative previous neurovascular work-up.  Multiple vascular risk factors of coronary artery disease, CHF, diabetes, hypertension, hyperlipidemia and obesity     PLAN:I had a long discussion with the patient and his caregiver regarding his longstanding tremors which appear to be multifactorial likely combination of mild drug-induced parkinsonism given his longstanding history of antipsychotics as well as possible medication effect of Depakote.  I do not think he lacks classical features of Parkinson's disease.  He also gives remote history of seizures but states he has not had them for several years and review of chart does not document clear documentation of this either.  He has had a few episodes of transient numbness which was felt to be TIA like with negative work-up.  I  recommend he continue on Plavix for stroke prevention and maintain aggressive risk factor modification with strict control of hypertension with blood pressure goal below 130/90, lipids with LDL cholesterol goal below 70 mg percent and diabetes with hemoglobin A1c goal below 6.5%.  I recommend increasing Sinemet since 25/100-3 times daily to see if it helps with his tremors since she is on quite a low dose.  Check EEG for seizure activity.  We also discussed fall prevention precautions.  Unfortunately the patient is not able to give me details of his previous neurologists name and work-up in [facility] for me to review those records.  Continue follow-up with cardiology for his syncopal episodes.  Return for follow-up in the future with my nurse practitioner in 3 months or call earlier if necessary.  Greater than 50% time during this 60-minute consultation visit was spent on counseling and coordination of care and extensive review of data in electronic medical records and discussion with patient and caregiver and answering questions. Antony Contras, MD  Note: This document was prepared with digital dictation and possible smart phrase technology. Any transcriptional errors that result from this process are unintentional.

## 2021-10-02 NOTE — Patient Instructions (Signed)
I had a long discussion with the patient and his caregiver regarding his longstanding tremors which appear to be multifactorial likely combination of mild drug-induced parkinsonism given his longstanding history of antipsychotics as well as possible medication effect of Depakote.  I do not think he lacks classical features of Parkinson's disease.  He also gives remote history of seizures but states he has not had them for several years and review of chart does not document clear documentation of this either.  He has had a few episodes of transient numbness which was felt to be TIA like with negative work-up.  I recommend increasing Sinemet since 25/100-3 times daily to see if it helps with his tremors since she is on quite a low dose.  Check EEG for seizure activity.  We also discussed fall prevention precautions.  Unfortunately the patient is not able to give me details of his previous neurologists name and work-up in [facility] for me to review those records.  Continue follow-up with cardiology for his syncopal episodes.  Return for follow-up in the future with my nurse practitioner in 3 months or call earlier if necessary. Fall Prevention in the Home, Adult Falls can cause injuries and can happen to people of all ages. There are many things you can do to make your home safe and to help prevent falls. Ask for help when making these changes. What actions can I take to prevent falls? General Instructions Use good lighting in all rooms. Replace any light bulbs that burn out. Turn on the lights in dark areas. Use night-lights. Keep items that you use often in easy-to-reach places. Lower the shelves around your home if needed. Set up your furniture so you have a clear path. Avoid moving your furniture around. Do not have throw rugs or other things on the floor that can make you trip. Avoid walking on wet floors. If any of your floors are uneven, fix them. Add color or contrast paint or tape to clearly mark and  help you see: Grab bars or handrails. First and last steps of staircases. Where the edge of each step is. If you use a stepladder: Make sure that it is fully opened. Do not climb a closed stepladder. Make sure the sides of the stepladder are locked in place. Ask someone to hold the stepladder while you use it. Know where your pets are when moving through your home. What can I do in the bathroom?   Keep the floor dry. Clean up any water on the floor right away. Remove soap buildup in the tub or shower. Use nonskid mats or decals on the floor of the tub or shower. Attach bath mats securely with double-sided, nonslip rug tape. If you need to sit down in the shower, use a plastic, nonslip stool. Install grab bars by the toilet and in the tub and shower. Do not use towel bars as grab bars. What can I do in the bedroom? Make sure that you have a light by your bed that is easy to reach. Do not use any sheets or blankets for your bed that hang to the floor. Have a firm chair with side arms that you can use for support when you get dressed. What can I do in the kitchen? Clean up any spills right away. If you need to reach something above you, use a step stool with a grab bar. Keep electrical cords out of the way. Do not use floor polish or wax that makes floors slippery. What can  I do with my stairs? Do not leave any items on the stairs. Make sure that you have a light switch at the top and the bottom of the stairs. Make sure that there are handrails on both sides of the stairs. Fix handrails that are broken or loose. Install nonslip stair treads on all your stairs. Avoid having throw rugs at the top or bottom of the stairs. Choose a carpet that does not hide the edge of the steps on the stairs. Check carpeting to make sure that it is firmly attached to the stairs. Fix carpet that is loose or worn. What can I do on the outside of my home? Use bright outdoor lighting. Fix the edges of  walkways and driveways and fix any cracks. Remove anything that might make you trip as you walk through a door, such as a raised step or threshold. Trim any bushes or trees on paths to your home. Check to see if handrails are loose or broken and that both sides of all steps have handrails. Install guardrails along the edges of any raised decks and porches. Clear paths of anything that can make you trip, such as tools or rocks. Have leaves, snow, or ice cleared regularly. Use sand or salt on paths during winter. Clean up any spills in your garage right away. This includes grease or oil spills. What other actions can I take? Wear shoes that: Have a low heel. Do not wear high heels. Have rubber bottoms. Feel good on your feet and fit well. Are closed at the toe. Do not wear open-toe sandals. Use tools that help you move around if needed. These include: Canes. Walkers. Scooters. Crutches. Review your medicines with your doctor. Some medicines can make you feel dizzy. This can increase your chance of falling. Ask your doctor what else you can do to help prevent falls. Where to find more information Centers for Disease Control and Prevention, [employer]: FootballExhibition.com.br [employer] on Aging: https://walker.com/ Contact a doctor if: You are afraid of falling at home. You feel weak, drowsy, or dizzy at home. You fall at home. Summary There are many simple things that you can do to make your home safe and to help prevent falls. Ways to make your home safe include removing things that can make you trip and installing grab bars in the bathroom. Ask for help when making these changes in your home. This information is not intended to replace advice given to you by your health care provider. Make sure you discuss any questions you have with your health care provider. Document Revised: 06/07/2020 Document Reviewed: 06/07/2020 Elsevier Patient Education  2022 [employer].

## 2021-10-03 NOTE — Progress Notes (Signed)
Cardiology Office Note  Date: 10/04/2021   ID: Jason [name], DOB 02/13/1951, MRN [id number]  PCP:  Dairl Ponder, NP  Cardiologist:  Dina Rich, MD Electrophysiologist:  None   Chief Complaint: 30-month follow-up  History of Present Illness: Jason [name] is a 70 y.o. male with a history of HTN, postural dizziness with presyncope, TIA, diastolic heart failure, PAF, OSA, DM2.  CABG times 4 : 2012 at [facility] [place], SVG-OM2) NSTEMI August 2017 [facility].  Left heart cath with 100% mid LAD, 80% OM 2, percent OM1, 40% diffuse RCA, LIMA and 1 SVG were patent.  He underwent BMS PCI of native OM 2. Echocardiogram 2018 [facility] : LVEF greater than 55%, G1 DD.    He was last seen by Dr. Wyline Mood on 08/16/2021 as a new consult referred by Dr. Lilian Kapur.  Previous history of having been seen at [facility] cardiology, [facility] cardiology, [facility] cardiology.  Previous notes mentioned history of ischemic heart disease and prior MI with stents.  Prior CABG 2012.  He was having some intermittent chest pains every few weeks described as sharp/dull pain, mid/left chest occurring at rest or with activity without associated symptoms could last for 1 to 1.5 hours.  History of pacemaker placed in 2020.  History of chronic schizophrenia.  History of chronic diastolic heart failure.  After talking with the patient, he stated he would never want another stent.  Plan was not to consider stress testing, but to treat with antianginal therapy and increase Imdur to 90 mg.  He was referred to device clinic to establish.  He was euvolemic and was continuing diuretics for his chronic diastolic heart failure.  He is here for follow-up today.  He states he woke up this morning with some chest pain and left anterior chest and left upper back pain.  He states he took some Tylenol and within 20 minutes back pain and chest pain were relieved.  He denies any radiation to neck, arm, jaw.  Denies any  associated nausea, vomiting, diaphoresis.  He denies any other issues today.  His blood pressure is elevated today.  He states he has been using a wheelchair more often because he has a history of syncopal episodes.  He has an upcoming EEG with neurology and follow-up he states he has had multiple imaging for syncopal episodes with no obvious neurologic pathology.  He denies any palpitations when these episodes occur.  He denies any palpitations in general.  Denies any CVA or TIA-like symptoms.  Denies any DOE or SOB, PND, orthopnea.  Weight today is 259.  He was previously on multiple diuretics therapy including HCTZ, Lasix, metolazone.  The rest home where he resides did not send his [facility] today for reconciliation.  Nursing staff requested and has gone over the medications.  It appears he was on losartan 100 mg twice a day at some point.  His most recent renal function and electrolytes done in July show sodium of 133, potassium 3.9, glucose 206, calcium 8.7, creatinine 0.89, GFR greater than 60.  He was on Lasix 30 mg p.o. daily and HCTZ 25 mg p.o. twice daily.  He does have some venous stasis changes on both lower extremities.  Past Medical History:  Diagnosis Date   Diabetes mellitus without complication (HCC)    GERD (gastroesophageal reflux disease)    Hypertension    MI (myocardial infarction) (HCC)    Paranoid schizophrenia (HCC)    Stroke (HCC)    Tourette disorder  Past Surgical History:  Procedure Laterality Date   CARDIAC SURGERY     CORONARY ARTERY BYPASS GRAFT     HAND SURGERY     TONSILLECTOMY      Current Outpatient Medications  Medication Sig Dispense Refill   acetaminophen (TYLENOL) 500 MG tablet Take 500 mg by mouth every 6 (six) hours as needed for mild pain or fever.     albuterol (VENTOLIN HFA) 108 (90 Base) MCG/ACT inhaler Inhale 1 puff into the lungs every 4 (four) hours as needed.     ammonium lactate (AMLACTIN) 12 % cream Apply 1 application topically 2 (two) times  daily.     ARIPiprazole (ABILIFY) 2 MG tablet Take 2 mg by mouth daily.     ARIPiprazole (ABILIFY) 5 MG tablet Take 5 mg by mouth daily.     aspirin EC 325 MG tablet Take 1 tablet (325 mg total) by mouth daily. 30 tablet 0   atorvastatin (LIPITOR) 80 MG tablet Take 1 tablet (80 mg total) by mouth at bedtime. 30 tablet 0   benztropine (COGENTIN) 1 MG tablet Take 1 mg by mouth 2 (two) times daily.     calcium carbonate (TUMS - DOSED IN MG ELEMENTAL CALCIUM) 500 MG chewable tablet Chew 1 tablet by mouth 3 (three) times daily as needed for indigestion or heartburn.     carbidopa-levodopa (SINEMET IR) 25-100 MG tablet Take 1 tablet by mouth 2 (two) times daily.     carvedilol (COREG) 25 MG tablet Take 25 mg by mouth 2 (two) times daily.     clopidogrel (PLAVIX) 75 MG tablet Take 1 tablet (75 mg total) by mouth daily. 30 tablet 0   gabapentin (NEURONTIN) 300 MG capsule Take 1 capsule (300 mg total) by mouth 3 (three) times daily. 90 capsule 0   hydrochlorothiazide (HYDRODIURIL) 25 MG tablet Take 25 mg by mouth 2 (two) times daily.     insulin aspart (NOVOLOG) 100 UNIT/ML injection Inject 10 Units into the skin 3 (three) times daily before meals. Hold if BS <150 or if resident state they will not be eating meal     insulin glargine (LANTUS) 100 UNIT/ML injection Inject 30 Units into the skin 2 (two) times daily.     isosorbide mononitrate (IMDUR) 60 MG 24 hr tablet Take 1.5 tablets (90 mg total) by mouth daily. 45 tablet 0   melatonin 5 MG TABS Take 5 mg by mouth at bedtime.     metFORMIN (GLUCOPHAGE) 1000 MG tablet Take 1 tablet (1,000 mg total) by mouth 2 (two) times daily. 30 tablet 0   nitroGLYCERIN (NITROSTAT) 0.4 MG SL tablet Place 1 tablet (0.4 mg total) under the tongue every 5 (five) minutes as needed for chest pain. (Patient taking differently: Place 0.4 mg under the tongue every 5 (five) minutes x 3 doses as needed for chest pain.) 10 tablet 0   omeprazole (PRILOSEC) 20 MG capsule Take 1  capsule by mouth daily.     Pimozide 1 MG TABS Take 1 tablet (1 mg total) by mouth 2 (two) times daily. 60 tablet 0   sertraline (ZOLOFT) 50 MG tablet Take 75 mg by mouth daily at 6 (six) AM.     tamsulosin (FLOMAX) 0.4 MG CAPS capsule Take 1 capsule (0.4 mg total) by mouth daily. 30 capsule 0   traZODone (DESYREL) 100 MG tablet Take 100 mg by mouth at bedtime.     VIMPAT 50 MG TABS tablet Take 100 mg by mouth 2 (two) times daily.  furosemide (LASIX) 20 MG tablet Take 1 tablet (20 mg total) by mouth daily as needed (for weight gain of 3 lbs/24 hours or 5 lbs/week). 30 tablet 3   losartan (COZAAR) 100 MG tablet Take 1 tablet (100 mg total) by mouth daily. 90 tablet 1   No current facility-administered medications for this visit.   Allergies:  Cephalosporins and Keflex [cephalexin]   Social History: The patient  reports that he has never smoked. He has never used smokeless tobacco. He reports that he does not currently use alcohol. He reports that he does not currently use drugs.   Family History: The patient's family history includes Heart attack in his father.   ROS:  Please see the history of present illness. Otherwise, complete review of systems is positive for none.  All other systems are reviewed and negative.   Physical Exam: VS:  BP (!) 156/80   Pulse 72   Ht 5\' 9"  (1.753 m)   Wt 259 lb 6.4 oz (117.7 kg)   SpO2 98%   BMI 38.31 kg/m , BMI Body mass index is 38.31 kg/m.  Wt Readings from Last 3 Encounters:  10/04/21 259 lb 6.4 oz (117.7 kg)  10/02/21 260 lb (117.9 kg)  08/16/21 264 lb (119.7 kg)    General: Patient appears comfortable at rest. Neck: Supple, no elevated JVP or carotid bruits, no thyromegaly. Lungs: Clear to auscultation, nonlabored breathing at rest. Cardiac: Regular rate and rhythm, no S3 or significant systolic murmur, no pericardial rub. Extremities: No pitting edema, distal pulses 2+. Skin: Warm and dry. Musculoskeletal: No  kyphosis. Neuropsychiatric: Alert and oriented x3, affect grossly appropriate.  ECG:    Recent Labwork: 06/03/2021: ALT 20; AST 17; BUN 14; Creatinine, Ser 0.89; Hemoglobin 11.4; Platelets 228; Potassium 3.9; Sodium 133  No results found for: CHOL, TRIG, HDL, CHOLHDL, VLDL, LDLCALC, LDLDIRECT  Other Studies Reviewed Today:  Nuclear stress test [place] [facility] 08/17/2018 [facility] at baselin Defect 1: There is a medium defect present in the basal inferior and mid inferior location. This is likely due to diaphragm attenuation. The study is normal. This is a low risk study. The left ventricular ejection fraction is normal (55-65%).  Assessment and Plan:  1. CAD in native artery   2. Pacemaker   3. Chronic diastolic heart failure ([place])   4. Medication management   5. Essential hypertension    1. CAD in native artery States he has some pain in his left chest and left shoulder this morning which was relieved with Tylenol per his statement.  He states approximately 20 minutes after taking Tylenol pain was relieved.  He denies any exertional chest pain, pressure, tightness.  He denied any radiation to neck, arm, jaw with chest pain.  Denied any associated nausea, vomiting, diaphoresis with recent chest pain.  Continue Imdur 90 mg daily.  Continue sublingual nitroglycerin as needed, continue Plavix 75 mg p.o. daily.  He is continuing full dose aspirin for this stroke prophylaxis 225 mg p.o. daily  2. Pacemaker He is establishing soon with Dr. Lovena Le for management of his pacemaker.  3. Chronic diastolic heart failure (HCC) No clinical evidence of heart failure noted.  Denies any DOE, SOB, PND, orthopnea, weight gain, fatigue.  Continue losartan 100 mg daily.  Continue HCTZ 25 mg p.o. twice daily.  Continue Coreg 25 mg p.o. twice daily.  Continue Lasix 20 mg p.o. as needed for weight gain of 3 pounds in 24 hours or 5 pounds in 1 week.  Continue to  weigh daily.  Restrict sodium, restrict fluid intake  to around 64 ounces per day.  Get follow-up BMP and magnesium.  4. Medication management Lasix was changed to 20 mg p.o. as needed for weight gain of 3 pounds in 24 hours or 5 pounds in 1 week.  Losartan was changed to 100 mg daily.  5.  Essential hypertension BP today is 156/80.  Blood pressure on 08/16/2021 was 126/68.  Continue losartan 100 mg p.o. daily.  Continue HCTZ 25 mg p.o. twice daily, continue Coreg 25 mg p.o. twice daily.  Medication Adjustments/Labs and Tests Ordered: Current medicines are reviewed at length with the patient today.  Concerns regarding medicines are outlined above.   Disposition: Follow-up with Dr. Harl Bowie or APP 6 months  Signed, Levell July, NP 10/04/2021 11:26 AM    [facility] [place], [place], [place] [address] Phone: [phone]; Fax: [phone]

## 2021-10-04 ENCOUNTER — Ambulatory Visit (INDEPENDENT_AMBULATORY_CARE_PROVIDER_SITE_OTHER): Payer: 59 | Admitting: Family Medicine

## 2021-10-04 ENCOUNTER — Encounter: Payer: Self-pay | Admitting: Family Medicine

## 2021-10-04 VITALS — BP 156/80 | HR 72 | Ht 69.0 in | Wt 259.4 lb

## 2021-10-04 DIAGNOSIS — I10 Essential (primary) hypertension: Secondary | ICD-10-CM

## 2021-10-04 DIAGNOSIS — Z79899 Other long term (current) drug therapy: Secondary | ICD-10-CM | POA: Diagnosis not present

## 2021-10-04 DIAGNOSIS — I5032 Chronic diastolic (congestive) heart failure: Secondary | ICD-10-CM | POA: Diagnosis not present

## 2021-10-04 DIAGNOSIS — I2510 Atherosclerotic heart disease of native coronary artery without angina pectoris: Secondary | ICD-10-CM | POA: Diagnosis not present

## 2021-10-04 DIAGNOSIS — Z950 Presence of cardiac pacemaker: Secondary | ICD-10-CM

## 2021-10-04 MED ORDER — LOSARTAN POTASSIUM 100 MG PO TABS: 100.0000 mg | ORAL_TABLET | Freq: Every day | ORAL | 1 refills | Status: AC

## 2021-10-04 MED ORDER — FUROSEMIDE 20 MG PO TABS: 20.0000 mg | ORAL_TABLET | Freq: Every day | ORAL | 3 refills | Status: AC | PRN

## 2021-10-04 NOTE — Patient Instructions (Addendum)
Medication Instructions:  Your physician has recommended you make the following change in your medication:  Decrease losartan to 100 mg daily Change furosemide to 20 mg daily as needed for weight gain of 3 lbs/one day or 5 lbs/week Continue other medications the same  Labwork: BMET & Mg level today at [facility]  Testing/Procedures: none  Follow-Up: Your physician recommends that you schedule a follow-up appointment in: 6 months  Any Other Special Instructions Will Be Listed Below (If Applicable).  If you need a refill on your cardiac medications before your next appointment, please call your pharmacy.

## 2021-10-08 ENCOUNTER — Encounter: Payer: Self-pay | Admitting: Neurology

## 2021-10-08 ENCOUNTER — Telehealth: Payer: Self-pay | Admitting: *Deleted

## 2021-10-08 ENCOUNTER — Other Ambulatory Visit: Payer: 59 | Admitting: *Deleted

## 2021-10-08 NOTE — Telephone Encounter (Signed)
-----   Message from Lesle Chris, LPN sent at 48/18/5909  1:01 PM EST -----  ----- Message ----- From: Netta Neat., NP Sent: 10/07/2021   5:37 PM EST To: Lesle Chris, LPN  Please call the patient and let him know his lab work looked good except sugar was high at 206.  Advised him to follow-up with his primary care provider for better management of diabetes  Netta Neat, NP  10/07/2021 5:36 PM

## 2021-10-08 NOTE — Telephone Encounter (Signed)
Patient's caregiver at [facility]) informed and verbalized understanding of plan. Copy faxed to facility

## 2021-10-08 NOTE — Telephone Encounter (Signed)
error 

## 2021-10-09 ENCOUNTER — Ambulatory Visit (INDEPENDENT_AMBULATORY_CARE_PROVIDER_SITE_OTHER): Payer: 59 | Admitting: Neurology

## 2021-10-09 DIAGNOSIS — R569 Unspecified convulsions: Secondary | ICD-10-CM | POA: Diagnosis not present

## 2021-10-09 DIAGNOSIS — R251 Tremor, unspecified: Secondary | ICD-10-CM

## 2021-10-16 ENCOUNTER — Ambulatory Visit: Payer: 59 | Admitting: Family Medicine

## 2021-10-23 ENCOUNTER — Ambulatory Visit (INDEPENDENT_AMBULATORY_CARE_PROVIDER_SITE_OTHER): Payer: 59 | Admitting: Internal Medicine

## 2021-10-23 ENCOUNTER — Other Ambulatory Visit: Payer: Self-pay

## 2021-10-23 ENCOUNTER — Encounter: Payer: Self-pay | Admitting: Internal Medicine

## 2021-10-23 VITALS — BP 122/70 | HR 85 | Ht 72.0 in | Wt 257.0 lb

## 2021-10-23 DIAGNOSIS — I442 Atrioventricular block, complete: Secondary | ICD-10-CM

## 2021-10-23 NOTE — Patient Instructions (Signed)
Medication Instructions:  Your physician recommends that you continue on your current medications as directed. Please refer to the Current Medication list given to you today.  *If you need a refill on your cardiac medications before your next appointment, please call your pharmacy*   Lab Work: NONE   If you have labs (blood work) drawn today and your tests are completely normal, you will receive your results only by: MyChart Message (if you have MyChart) OR A paper copy in the mail If you have any lab test that is abnormal or we need to change your treatment, we will call you to review the results.   Testing/Procedures: NONE    Follow-Up: At [facility], you and your health needs are our priority.  As part of our continuing mission to provide you with exceptional heart care, we have created designated Provider Care Teams.  These Care Teams include your primary Cardiologist (physician) and Advanced Practice Providers (APPs -  Physician Assistants and Nurse Practitioners) who all work together to provide you with the care you need, when you need it.  We recommend signing up for the patient portal called "MyChart".  Sign up information is provided on this After Visit Summary.  MyChart is used to connect with patients for Virtual Visits (Telemedicine).  Patients are able to view lab/test results, encounter notes, upcoming appointments, etc.  Non-urgent messages can be sent to your provider as well.   To learn more about what you can do with MyChart, go to [email or web address].    Your next appointment:   1 year(s)  The format for your next appointment:   In Person  Provider:   Lewayne Bunting, MD    Other Instructions When you get you remote device please call the Device Clinic at [phone]   Thank you for choosing [facility]!

## 2021-10-23 NOTE — Progress Notes (Signed)
HPI Jason [name] is referred by Dr. Wyline [name] for ongoing evaluation of CHB, s/p PPM insertion. The patient has done well since his PPM. He denies syncope or chest pain or sob. He is sedentary. He is s/p remote CABG and valve replacement.  Allergies  Allergen Reactions   Cephalosporins Hives and Rash    Pt is unaware of this allergy.   Keflex [Cephalexin] Rash     Current Outpatient Medications  Medication Sig Dispense Refill   acetaminophen (TYLENOL) 500 MG tablet Take 500 mg by mouth every 6 (six) hours as needed for mild pain or fever.     albuterol (VENTOLIN HFA) 108 (90 Base) MCG/ACT inhaler Inhale 1 puff into the lungs every 4 (four) hours as needed.     ammonium lactate (AMLACTIN) 12 % cream Apply 1 application topically 2 (two) times daily.     ARIPiprazole (ABILIFY) 2 MG tablet Take 2 mg by mouth daily.     ARIPiprazole (ABILIFY) 5 MG tablet Take 5 mg by mouth daily.     aspirin EC 325 MG tablet Take 1 tablet (325 mg total) by mouth daily. 30 tablet 0   atorvastatin (LIPITOR) 80 MG tablet Take 1 tablet (80 mg total) by mouth at bedtime. 30 tablet 0   benztropine (COGENTIN) 1 MG tablet Take 1 mg by mouth 2 (two) times daily.     calcium carbonate (TUMS - DOSED IN MG ELEMENTAL CALCIUM) 500 MG chewable tablet Chew 1 tablet by mouth 3 (three) times daily as needed for indigestion or heartburn.     carbidopa-levodopa (SINEMET IR) 25-100 MG tablet Take 1 tablet by mouth 2 (two) times daily.     carvedilol (COREG) 25 MG tablet Take 25 mg by mouth 2 (two) times daily.     clopidogrel (PLAVIX) 75 MG tablet Take 1 tablet (75 mg total) by mouth daily. 30 tablet 0   furosemide (LASIX) 20 MG tablet Take 1 tablet (20 mg total) by mouth daily as needed (for weight gain of 3 lbs/24 hours or 5 lbs/week). 30 tablet 3   gabapentin (NEURONTIN) 300 MG capsule Take 1 capsule (300 mg total) by mouth 3 (three) times daily. 90 capsule 0   hydrochlorothiazide (HYDRODIURIL) 25 MG tablet Take 25 mg by  mouth 2 (two) times daily.     insulin aspart (NOVOLOG) 100 UNIT/ML injection Inject 10 Units into the skin 3 (three) times daily before meals. Hold if BS <150 or if resident state they will not be eating meal     insulin glargine (LANTUS) 100 UNIT/ML injection Inject 30 Units into the skin 2 (two) times daily.     isosorbide mononitrate (IMDUR) 60 MG 24 hr tablet Take 1.5 tablets (90 mg total) by mouth daily. 45 tablet 0   losartan (COZAAR) 100 MG tablet Take 1 tablet (100 mg total) by mouth daily. 90 tablet 1   melatonin 5 MG TABS Take 5 mg by mouth at bedtime.     metFORMIN (GLUCOPHAGE) 1000 MG tablet Take 1 tablet (1,000 mg total) by mouth 2 (two) times daily. 30 tablet 0   nitroGLYCERIN (NITROSTAT) 0.4 MG SL tablet Place 1 tablet (0.4 mg total) under the tongue every 5 (five) minutes as needed for chest pain. (Patient taking differently: Place 0.4 mg under the tongue every 5 (five) minutes x 3 doses as needed for chest pain.) 10 tablet 0   omeprazole (PRILOSEC) 20 MG capsule Take 1 capsule by mouth daily.     Pimozide  1 MG TABS Take 1 tablet (1 mg total) by mouth 2 (two) times daily. 60 tablet 0   sertraline (ZOLOFT) 50 MG tablet Take 75 mg by mouth daily at 6 (six) AM.     tamsulosin (FLOMAX) 0.4 MG CAPS capsule Take 1 capsule (0.4 mg total) by mouth daily. 30 capsule 0   traZODone (DESYREL) 100 MG tablet Take 100 mg by mouth at bedtime.     VIMPAT 50 MG TABS tablet Take 100 mg by mouth 2 (two) times daily.     No current facility-administered medications for this visit.     Past Medical History:  Diagnosis Date   Diabetes mellitus without complication (HCC)    GERD (gastroesophageal reflux disease)    Hypertension    MI (myocardial infarction) (HCC)    Paranoid schizophrenia (HCC)    Stroke (HCC)    Tourette disorder     ROS:   All systems reviewed and negative except as noted in the HPI.   Past Surgical History:  Procedure Laterality Date   CARDIAC SURGERY     CORONARY  ARTERY BYPASS GRAFT     HAND SURGERY     TONSILLECTOMY       Family History  Problem Relation Age of Onset   Heart attack Father      Social History   Socioeconomic History   Marital status: Single    Spouse name: Not on file   Number of children: Not on file   Years of education: Not on file   Highest education level: Not on file  Occupational History   Occupation: Disabled  Tobacco Use   Smoking status: Never   Smokeless tobacco: Never  Vaping Use   Vaping Use: Never used  Substance and Sexual Activity   Alcohol use: Not Currently   Drug use: Not Currently   Sexual activity: Not Currently  Other Topics Concern   Not on file  Social History Narrative   On disability; lives at [name] home      Social Determinants of Health   Financial Resource Strain: Not on file  Food Insecurity: Not on file  Transportation Needs: Not on file  Physical Activity: Not on file  Stress: Not on file  Social Connections: Not on file  Intimate Partner Violence: Not on file     BP 122/70   Pulse 85   Ht 6' (1.829 m)   Wt 257 lb (116.6 kg)   SpO2 97%   BMI 34.86 kg/m   Physical Exam:  overweight appearing 70 yo man, NAD HEENT: Unremarkable Neck:  No JVD, no thyromegally Lymphatics:  No adenopathy Back:  No CVA tenderness Lungs:  Clear with no wheezes HEART:  Regular rate rhythm, no murmurs, no rubs, no clicks Abd:  soft, positive bowel sounds, no organomegally, no rebound, no guarding Ext:  2 plus pulses, no edema, no cyanosis, no clubbing Skin:  No rashes no nodules Neuro:  CN II through XII intact, motor grossly intact  DEVICE  Normal device function.  See PaceArt for details.   Assess/Plan:  CHB - he is asymptomatic s/p PPM insertion. PPM - his medtronic DDD PM is working normally.  Obesity - I encouraged him to lose weight. CAD - he denies anginal symptoms. No change in his meds.  Jason Gowda Zimri Brennen,MD

## 2021-11-02 ENCOUNTER — Telehealth: Payer: Self-pay

## 2021-11-02 NOTE — Telephone Encounter (Signed)
Spoke with patient's caregiver Malvin Johns (on DPR) to inform of unviewed patient results of the EEG sent to pt's mychart. She verbalized understanding and expressed appreciation for the call. All questions answered.

## 2021-12-10 ENCOUNTER — Telehealth: Payer: Self-pay

## 2021-12-10 NOTE — Telephone Encounter (Signed)
I spoke with the nurse at the facility and she states the patient does not have a monitor for his pacemaker. I ordered the patient a monitor and told her she should receive it in 7-10 business days. I told her to call back after 10 days if she does not receive it.

## 2022-01-22 ENCOUNTER — Ambulatory Visit (INDEPENDENT_AMBULATORY_CARE_PROVIDER_SITE_OTHER): Payer: 59

## 2022-01-22 DIAGNOSIS — I442 Atrioventricular block, complete: Secondary | ICD-10-CM | POA: Diagnosis not present

## 2022-01-28 LAB — CUP PACEART REMOTE DEVICE CHECK
Battery Remaining Longevity: 107 mo
Battery Voltage: 3 V
Brady Statistic AP VP Percent: 85.11 %
Brady Statistic AP VS Percent: 0 %
Brady Statistic AS VP Percent: 14.88 %
Brady Statistic AS VS Percent: 0.01 %
Brady Statistic RA Percent Paced: 85.09 %
Brady Statistic RV Percent Paced: 99.98 %
Date Time Interrogation Session: 20230312194218
Implantable Lead Implant Date: 20200320
Implantable Lead Implant Date: 20200320
Implantable Lead Location: 753859
Implantable Lead Location: 753860
Implantable Lead Model: 5076
Implantable Lead Model: 5076
Implantable Pulse Generator Implant Date: 20200320
Lead Channel Impedance Value: 285 Ohm
Lead Channel Impedance Value: 323 Ohm
Lead Channel Impedance Value: 342 Ohm
Lead Channel Impedance Value: 418 Ohm
Lead Channel Pacing Threshold Amplitude: 0.75 V
Lead Channel Pacing Threshold Amplitude: 0.875 V
Lead Channel Pacing Threshold Pulse Width: 0.4 ms
Lead Channel Pacing Threshold Pulse Width: 0.4 ms
Lead Channel Sensing Intrinsic Amplitude: 2.75 mV
Lead Channel Sensing Intrinsic Amplitude: 2.75 mV
Lead Channel Sensing Intrinsic Amplitude: 9.5 mV
Lead Channel Sensing Intrinsic Amplitude: 9.5 mV
Lead Channel Setting Pacing Amplitude: 1.5 V
Lead Channel Setting Pacing Amplitude: 1.5 V
Lead Channel Setting Pacing Pulse Width: 0.4 ms
Lead Channel Setting Sensing Sensitivity: 1.2 mV

## 2022-02-04 NOTE — Progress Notes (Signed)
Remote pacemaker transmission.   

## 2022-02-05 ENCOUNTER — Ambulatory Visit (INDEPENDENT_AMBULATORY_CARE_PROVIDER_SITE_OTHER): Payer: 59 | Admitting: Neurology

## 2022-02-05 ENCOUNTER — Encounter: Payer: Self-pay | Admitting: Neurology

## 2022-02-05 VITALS — BP 125/74 | HR 89 | Ht 72.0 in | Wt 257.0 lb

## 2022-02-05 DIAGNOSIS — M62541 Muscle wasting and atrophy, not elsewhere classified, right hand: Secondary | ICD-10-CM

## 2022-02-05 DIAGNOSIS — G252 Other specified forms of tremor: Secondary | ICD-10-CM | POA: Diagnosis not present

## 2022-02-05 NOTE — Patient Instructions (Signed)
I had a long discussion with the patient and his social worker/caregiver regarding his longstanding tremors which appear to be multifactorial due to combination of longstanding history of antipsychotics and mild drug-induced parkinsonism.  Tremor is do not appear to be particularly disabling hence recommend continue low-dose Sinemet at the present dose of 25/100 twice daily follow-up with a psychiatrist for his antipsychotics.  Patient is moving out of the area hence I would not recommend evaluation for his hand wasting which may need EMG nerve conduction study and MRI of the cervical spine to rule out compressive radiculopathy.  This can be arranged by his new physician in the area where he moves.  No scheduled follow-up appointment with me is necessary but he can call if needed.Jason [name] ?

## 2022-02-05 NOTE — Progress Notes (Signed)
?[facility] ?[id number] ?[place]. [place] [address] ?[phone] ? ?     OFFICE FOLLOW UP VISIT NOTE ? ?[name] ?Date of Birth:  03-28-1951 ?Medical Record Number:  [id number]  ? ?Referring MD: Patrina Levering ? ?Reason for Referral: Establish neurological care ? ?HPI: Initial visit 10/02/2021 :Jason [name] is a 71 year old Caucasian male his past medical history of coronary artery disease, pacemaker, chronic schizophrenia, chronic diastolic heart failure, diabetes, hypertension, MI, Tourette disorder, questionable Parkinson disease and seizures, tremors, frequent falls.  He has recently moved to [facility] area in March 2022 and is here to establish neurological care.  He informs me that he was diagnosed as with seizures and saw a neurologist in [facility] but is unable to give me the name or address it with last visit being 12 years ago.  Is also been diagnosed with Parkinson's and has been on Sinemet 25/101 tablet twice daily.  His most disability is his tremors which he states is present at rest but for the last 3 to 4 years has been increasing in is disabling and he has trouble holding objects and writing and eating because of it.  Is tolerating current dose of Sinemet well without side effects.  He denies any drooling of saliva, bradykinesia, gait festination, stooped posture.  He has had multiple episodes of near syncope and has had extensive cardiac evaluation including a previous MI and pacemaker insertion.  He is is also recently seen Dr. Dina Rich cardiologist in [place] to establish cardiac care.  He states he cannot remember when he has his last seizure but he is currently on Depakote and not sure if it is for his mood or for seizures.  In the past he states he has had a couple of episodes of strokes.  He had a large 1 5 years ago which affected left side of his body and he was hospitalized for several days but recovered well.  2 years ago he states  he had a light stroke involving the right side of the body and was admitted in [place] for a few days.  Review of electronic medical records in Care Everywhere shows that he had a telemetry neurology consult on 01/29/2017 at [facility] for left-sided face arm and leg numbness and had negative CT scan of the head and he was claustrophobic and refused an MRI.  Another neuro follow-up visit at [facility] on 07/08/2013 by Emmie Niemann nurse practitioner shows that he had recurrent episodes of right sided numbness which were felt to be probably functional.  CT head was again negative.  I cannot find records of 3 CT scans in his electronic medical records of the head all of which were unremarkable except for mild atrophy.  There is no MRI report to be found.  Patient takes Plavix for cardiac and stroke prevention and is tolerating well without bruising or bleeding.  He has had no recurrent recent TIA or stroke symptoms of chest pain.  He is also on Lipitor which is tolerating well without muscle aches and pains.  He also has diabetes and blood pressure and blood pressure is not well controlled today and it is 174/88. ?Update 02/05/2022 ; He returns for follow-up after last visit 4 months ago.  He is accompanied by his Oceanographer.  He continues to live in assisted living but plans to move in the next few weeks to a new place but does not know at present where it will be.  He states his tremors are about the same.  I advised him to increase the dose of Sinemet to 3 tablets daily at the last visit but he has not done that.  He states he sleeps very little only to 3 hours a day.  He is has frequency of urination at night this is likely due to Lasix that he takes.  He has no new complaints.  He remains on Plavix which is tolerating well without bleeding or bruising.  States his blood pressures under good control today it is 125/74.  He remains on Lipitor which is tolerating well without muscle  aches and pains.  He had EEG on 10/18/2021 which showed mild generalized slowing of 7 to 8 Hz with no epileptiform activity.  He has not had any recent falls or syncopal events. ?ROS:   ?14 system review of systems is positive for tremors, frequent falls, imbalance, syncopal episodes, numbness all other systems negative ? ?PMH:  ?Past Medical History:  ?Diagnosis Date  ? Diabetes mellitus without complication (HCC)   ? GERD (gastroesophageal reflux disease)   ? Hypertension   ? MI (myocardial infarction) (HCC)   ? Paranoid schizophrenia (HCC)   ? Stroke [facility](HCC)   ? Tourette disorder   ? ? ?Social History:  ?Social History  ? ?Socioeconomic History  ? Marital status: Single  ?  Spouse name: Not on file  ? Number of children: Not on file  ? Years of education: Not on file  ? Highest education level: Not on file  ?Occupational History  ? Occupation: Disabled  ?Tobacco Use  ? Smoking status: Never  ? Smokeless tobacco: Never  ?Vaping Use  ? Vaping Use: Never used  ?Substance and Sexual Activity  ? Alcohol use: Not Currently  ? Drug use: Not Currently  ? Sexual activity: Not Currently  ?Other Topics Concern  ? Not on file  ?Social History Narrative  ? On disability; lives at [facility]  ?   ? ?Social Determinants of Health  ? ?Financial Resource Strain: Not on file  ?Food Insecurity: Not on file  ?Transportation Needs: Not on file  ?Physical Activity: Not on file  ?Stress: Not on file  ?Social Connections: Not on file  ?Intimate Partner Violence: Not on file  ? ? ?Medications:   ?Current Outpatient Medications on File Prior to Visit  ?Medication Sig Dispense Refill  ? acetaminophen (TYLENOL) 500 MG tablet Take 500 mg by mouth every 6 (six) hours as needed for mild pain or fever.    ? albuterol (VENTOLIN HFA) 108 (90 Base) MCG/ACT inhaler Inhale 1 puff into the lungs every 4 (four) hours as needed.    ? ammonium lactate (AMLACTIN) 12 % cream Apply 1 application topically 2 (two) times daily.    ? ARIPiprazole  (ABILIFY) 2 MG tablet Take 2 mg by mouth daily.    ? ARIPiprazole (ABILIFY) 5 MG tablet Take 5 mg by mouth daily.    ? aspirin EC 325 MG tablet Take 1 tablet (325 mg total) by mouth daily. 30 tablet 0  ? atorvastatin (LIPITOR) 80 MG tablet Take 1 tablet (80 mg total) by mouth at bedtime. 30 tablet 0  ? benztropine (COGENTIN) 1 MG tablet Take 1 mg by mouth 2 (two) times daily.    ? calcium carbonate (TUMS - DOSED IN MG ELEMENTAL CALCIUM) 500 MG chewable tablet Chew 1 tablet by mouth 3 (three) times daily as needed for indigestion or heartburn.    ? carbidopa-levodopa (SINEMET  IR) 25-100 MG tablet Take 1 tablet by mouth 2 (two) times daily.    ? carvedilol (COREG) 25 MG tablet Take 25 mg by mouth 2 (two) times daily.    ? clopidogrel (PLAVIX) 75 MG tablet Take 1 tablet (75 mg total) by mouth daily. 30 tablet 0  ? furosemide (LASIX) 20 MG tablet Take 1 tablet (20 mg total) by mouth daily as needed (for weight gain of 3 lbs/24 hours or 5 lbs/week). 30 tablet 3  ? gabapentin (NEURONTIN) 300 MG capsule Take 1 capsule (300 mg total) by mouth 3 (three) times daily. 90 capsule 0  ? hydrochlorothiazide (HYDRODIURIL) 25 MG tablet Take 25 mg by mouth 2 (two) times daily.    ? insulin aspart (NOVOLOG) 100 UNIT/ML injection Inject 10 Units into the skin 3 (three) times daily before meals. Hold if BS <150 or if resident state they will not be eating meal    ? insulin glargine (LANTUS) 100 UNIT/ML injection Inject 30 Units into the skin 2 (two) times daily.    ? isosorbide mononitrate (IMDUR) 60 MG 24 hr tablet Take 1.5 tablets (90 mg total) by mouth daily. 45 tablet 0  ? losartan (COZAAR) 100 MG tablet Take 1 tablet (100 mg total) by mouth daily. 90 tablet 1  ? melatonin 5 MG TABS Take 5 mg by mouth at bedtime.    ? metFORMIN (GLUCOPHAGE) 1000 MG tablet Take 1 tablet (1,000 mg total) by mouth 2 (two) times daily. 30 tablet 0  ? nitroGLYCERIN (NITROSTAT) 0.4 MG SL tablet Place 1 tablet (0.4 mg total) under the tongue every 5 (five)  minutes as needed for chest pain. (Patient taking differently: Place 0.4 mg under the tongue every 5 (five) minutes x 3 doses as needed for chest pain.) 10 tablet 0  ? omeprazole (PRILOSEC) 20 MG capsule

## 2022-02-11 ENCOUNTER — Other Ambulatory Visit: Payer: Self-pay

## 2022-02-11 ENCOUNTER — Encounter (HOSPITAL_COMMUNITY): Payer: Self-pay

## 2022-02-11 ENCOUNTER — Emergency Department (HOSPITAL_COMMUNITY): Payer: 59

## 2022-02-11 ENCOUNTER — Emergency Department (HOSPITAL_COMMUNITY)
Admission: EM | Admit: 2022-02-11 | Discharge: 2022-02-11 | Disposition: A | Discharge status: Home / Self Care | Payer: 59 | Attending: Emergency Medicine | Admitting: Emergency Medicine

## 2022-02-11 DIAGNOSIS — Z7982 Long term (current) use of aspirin: Secondary | ICD-10-CM | POA: Diagnosis not present

## 2022-02-11 DIAGNOSIS — Z794 Long term (current) use of insulin: Secondary | ICD-10-CM | POA: Insufficient documentation

## 2022-02-11 DIAGNOSIS — Z7902 Long term (current) use of antithrombotics/antiplatelets: Secondary | ICD-10-CM | POA: Diagnosis not present

## 2022-02-11 DIAGNOSIS — R079 Chest pain, unspecified: Secondary | ICD-10-CM | POA: Diagnosis not present

## 2022-02-11 DIAGNOSIS — R200 Anesthesia of skin: Secondary | ICD-10-CM | POA: Diagnosis not present

## 2022-02-11 DIAGNOSIS — Z951 Presence of aortocoronary bypass graft: Secondary | ICD-10-CM | POA: Diagnosis not present

## 2022-02-11 LAB — CBC WITH DIFFERENTIAL/PLATELET
Abs Immature Granulocytes: 0.05 10*3/uL (ref 0.00–0.07)
Basophils Absolute: 0 10*3/uL (ref 0.0–0.1)
Basophils Relative: 0 %
Eosinophils Absolute: 0.4 10*3/uL (ref 0.0–0.5)
Eosinophils Relative: 5 %
HCT: 34.8 % — ABNORMAL LOW (ref 39.0–52.0)
Hemoglobin: 11.4 g/dL — ABNORMAL LOW (ref 13.0–17.0)
Immature Granulocytes: 1 %
Lymphocytes Relative: 13 %
Lymphs Abs: 1 10*3/uL (ref 0.7–4.0)
MCH: 27.9 pg (ref 26.0–34.0)
MCHC: 32.8 g/dL (ref 30.0–36.0)
MCV: 85.3 fL (ref 80.0–100.0)
Monocytes Absolute: 0.6 10*3/uL (ref 0.1–1.0)
Monocytes Relative: 7 %
Neutro Abs: 5.9 10*3/uL (ref 1.7–7.7)
Neutrophils Relative %: 74 %
Platelets: 215 10*3/uL (ref 150–400)
RBC: 4.08 MIL/uL — ABNORMAL LOW (ref 4.22–5.81)
RDW: 14.5 % (ref 11.5–15.5)
WBC: 7.9 10*3/uL (ref 4.0–10.5)
nRBC: 0 % (ref 0.0–0.2)

## 2022-02-11 LAB — BASIC METABOLIC PANEL
Anion gap: 8 (ref 5–15)
BUN: 23 mg/dL (ref 8–23)
CO2: 24 mmol/L (ref 22–32)
Calcium: 8.5 mg/dL — ABNORMAL LOW (ref 8.9–10.3)
Chloride: 103 mmol/L (ref 98–111)
Creatinine, Ser: 1.11 mg/dL (ref 0.61–1.24)
GFR, Estimated: >60 mL/min (ref >60)
Glucose, Bld: 183 mg/dL — ABNORMAL HIGH (ref 70–99)
Potassium: 3.9 mmol/L (ref 3.5–5.1)
Sodium: 135 mmol/L (ref 135–145)

## 2022-02-11 LAB — TROPONIN I (HIGH SENSITIVITY): Troponin I (High Sensitivity): 7 ng/L (ref <18)

## 2022-02-11 NOTE — ED Triage Notes (Signed)
Patient reports intermittent chest pain for the past three days with exertion. States that he took ASA and one Nitro tab PTA and states that pain has completely resolved. From [facility].  ?

## 2022-02-11 NOTE — ED Notes (Signed)
Patient refusing VS or monitoring equipment. States I can't stand this stuff on me any longer, there's nothing wrong with me." Demands that IV be removed. ?

## 2022-02-11 NOTE — Discharge Instructions (Signed)
You were seen in the emergency department for 3 days of on and off chest pain.  You had blood work EKG and a chest x-ray that did not show an obvious cause of your symptoms.  You were not willing to stick around for a second troponin.  Please contact your cardiologist for close follow-up.  Return to the emergency department if any worsening or concerning symptoms. ?

## 2022-02-11 NOTE — ED Provider Notes (Signed)
?[place] EMERGENCY DEPARTMENT ?Provider Note ? ? ?CSN: [id number] ?Arrival date & time: 02/11/22  1455 ? ?  ? ?History ? ?Chief Complaint  ?Patient presents with  ? Chest Pain  ? ? ?Jason [name] is a 71 y.o. male.  He has a history of CABG coronary disease paroxysmal A-fib.  Complaining of 3 days of intermittent left-sided chest pain.  Denies any radiation into back or arm.  Worsened with exertion improved with rest.  Today was the worst day ever.  Took aspirin and nitro with improvement and currently rates his pain a 0 out of 10.  Also tells me 3 days ago he had an episode of left face and chest arm numbness that lasted a few hours and completely resolved. ? ? ?Chest Pain ?Associated symptoms: numbness   ?Associated symptoms: no abdominal pain, no fever and no shortness of breath   ? ?  ? ?Home Medications ?Prior to Admission medications   ?Medication Sig Start Date End Date Taking? Authorizing Provider  ?acetaminophen (TYLENOL) 500 MG tablet Take 500 mg by mouth every 6 (six) hours as needed for mild pain or fever.    [provider]  ?albuterol (VENTOLIN HFA) 108 (90 Base) MCG/ACT inhaler Inhale 1 puff into the lungs every 4 (four) hours as needed. 01/01/21   [provider]  ?ammonium lactate (AMLACTIN) 12 % cream Apply 1 application topically 2 (two) times daily.    [provider]  ?ARIPiprazole (ABILIFY) 2 MG tablet Take 2 mg by mouth daily. 09/12/21   [provider]  ?ARIPiprazole (ABILIFY) 5 MG tablet Take 5 mg by mouth daily. 02/05/21   [provider]  ?aspirin EC 325 MG tablet Take 1 tablet (325 mg total) by mouth daily. 02/01/19   Blane Ohara, MD  ?atorvastatin (LIPITOR) 80 MG tablet Take 1 tablet (80 mg total) by mouth at bedtime. 02/01/19   Blane Ohara, MD  ?benztropine (COGENTIN) 1 MG tablet Take 1 mg by mouth 2 (two) times daily. 09/12/21   [provider]  ?calcium carbonate (TUMS - DOSED IN MG ELEMENTAL CALCIUM) 500 MG chewable tablet  Chew 1 tablet by mouth 3 (three) times daily as needed for indigestion or heartburn.    [provider]  ?carbidopa-levodopa (SINEMET IR) 25-100 MG tablet Take 1 tablet by mouth 2 (two) times daily. 01/31/21   [provider]  ?carvedilol (COREG) 25 MG tablet Take 25 mg by mouth 2 (two) times daily. 09/12/21   [provider]  ?clopidogrel (PLAVIX) 75 MG tablet Take 1 tablet (75 mg total) by mouth daily. 02/01/19   Blane Ohara, MD  ?furosemide (LASIX) 20 MG tablet Take 1 tablet (20 mg total) by mouth daily as needed (for weight gain of 3 lbs/24 hours or 5 lbs/week). 10/04/21   Netta Neat., NP  ?gabapentin (NEURONTIN) 300 MG capsule Take 1 capsule (300 mg total) by mouth 3 (three) times daily. 02/01/19   Blane Ohara, MD  ?hydrochlorothiazide (HYDRODIURIL) 25 MG tablet Take 25 mg by mouth 2 (two) times daily. 09/12/21   [provider]  ?insulin aspart (NOVOLOG) 100 UNIT/ML injection Inject 10 Units into the skin 3 (three) times daily before meals. Hold if BS <150 or if resident state they will not be eating meal    [provider]  ?insulin glargine (LANTUS) 100 UNIT/ML injection Inject 30 Units into the skin 2 (two) times daily.    [provider]  ?isosorbide mononitrate (IMDUR) 60 MG 24 hr  tablet Take 1.5 tablets (90 mg total) by mouth daily. 08/16/21   Antoine PocheBranch, Jonathan F, MD  ?losartan (COZAAR) 100 MG tablet Take 1 tablet (100 mg total) by mouth daily. 10/04/21   Netta NeatQuinn, Andrew L Jr., NP  ?melatonin 5 MG TABS Take 5 mg by mouth at bedtime.    [provider]  ?metFORMIN (GLUCOPHAGE) 1000 MG tablet Take 1 tablet (1,000 mg total) by mouth 2 (two) times daily. 02/01/19   Blane OharaZavitz, Joshua, MD  ?nitroGLYCERIN (NITROSTAT) 0.4 MG SL tablet Place 1 tablet (0.4 mg total) under the tongue every 5 (five) minutes as needed for chest pain. ?Patient taking differently: Place 0.4 mg under the tongue every 5 (five) minutes x 3 doses as needed for chest pain.  02/01/19   Blane OharaZavitz, Joshua, MD  ?omeprazole (PRILOSEC) 20 MG capsule Take 1 capsule by mouth daily. 01/31/21   [provider]  ?Pimozide 1 MG TABS Take 1 tablet (1 mg total) by mouth 2 (two) times daily. 02/01/19   Blane OharaZavitz, Joshua, MD  ?sertraline (ZOLOFT) 50 MG tablet Take 75 mg by mouth daily at 6 (six) AM. 09/12/21   [provider]  ?tamsulosin (FLOMAX) 0.4 MG CAPS capsule Take 1 capsule (0.4 mg total) by mouth daily. 02/01/19   Blane OharaZavitz, Joshua, MD  ?traZODone (DESYREL) 100 MG tablet Take 100 mg by mouth at bedtime. 08/16/21   [provider]  ?VIMPAT 50 MG TABS tablet Take 100 mg by mouth 2 (two) times daily. 02/06/21   [provider]  ?   ? ?Allergies    ?Cephalosporins and Keflex [cephalexin]   ? ?Review of Systems   ?Review of Systems  ?Constitutional:  Negative for fever.  ?HENT:  Negative for sore throat.   ?Eyes:  Negative for visual disturbance.  ?Respiratory:  Negative for shortness of breath.   ?Cardiovascular:  Positive for chest pain.  ?Gastrointestinal:  Negative for abdominal pain.  ?Genitourinary:  Negative for dysuria.  ?Musculoskeletal:  Negative for neck pain.  ?Skin:  Negative for rash.  ?Neurological:  Positive for numbness.  ? ?Physical Exam ?Updated Vital Signs ?BP 121/69   Pulse 66   Temp 98.1 ?F (36.7 ?C) (Oral)   Resp 18   Ht 6' (1.829 m)   Wt 118.4 kg   SpO2 100%   BMI 35.40 kg/m?  ?Physical Exam ?Vitals and nursing note reviewed.  ?Constitutional:   ?   General: He is not in acute distress. ?   Appearance: Normal appearance. He is well-developed.  ?HENT:  ?   Head: Normocephalic and atraumatic.  ?Eyes:  ?   Conjunctiva/sclera: Conjunctivae normal.  ?Cardiovascular:  ?   Rate and Rhythm: Normal rate and regular rhythm.  ?   Heart sounds: No murmur heard. ?Pulmonary:  ?   Effort: Pulmonary effort is normal. No respiratory distress.  ?   Breath sounds: Normal breath sounds.  ?Abdominal:  ?   Palpations: Abdomen is soft.  ?   Tenderness: There is no  abdominal tenderness. There is no guarding or rebound.  ?Musculoskeletal:     ?   General: No swelling. Normal range of motion.  ?   Cervical back: Neck supple.  ?Skin: ?   General: Skin is warm and dry.  ?   Capillary Refill: Capillary refill takes less than 2 seconds.  ?Neurological:  ?   General: No focal deficit present.  ?   Mental Status: He is alert.  ?   Sensory: No sensory deficit.  ?  Motor: No weakness.  ?   Gait: Gait normal.  ? ? ?ED Results / Procedures / Treatments   ?Labs ?(all labs ordered are listed, but only abnormal results are displayed) ?Labs Reviewed  ?BASIC METABOLIC PANEL - Abnormal; Notable for the following components:  ?    Result Value  ? Glucose, Bld 183 (*)   ? Calcium 8.5 (*)   ? All other components within normal limits  ?CBC WITH DIFFERENTIAL/PLATELET - Abnormal; Notable for the following components:  ? RBC 4.08 (*)   ? Hemoglobin 11.4 (*)   ? HCT 34.8 (*)   ? All other components within normal limits  ?TROPONIN I (HIGH SENSITIVITY)  ? ? ?EKG ?EKG Interpretation ? ?Date/Time:  Monday February 11 2022 15:03:48 EDT ?Ventricular Rate:  75 ?PR Interval:  68 ?QRS Duration: 159 ?QT Interval:  435 ?QTC Calculation: 486 ?R Axis:   -82 ?Text Interpretation: Atrial-paced rhythm No significant change since 7/22 Confirmed by Meridee Score [phone]) on 02/11/2022 3:06:26 PM ? ?Radiology ?DG Chest Port 1 View ? ?Result Date: 02/11/2022 ?CLINICAL DATA:  Intermittent chest pain with exertion. EXAM: PORTABLE CHEST 1 VIEW COMPARISON:  07/02/2021. FINDINGS: Right-sided pacemaker lead tips are in the right atrium and right ventricle. Heart is enlarged, stable. Minimal streaky atelectasis in the left lung base. No airspace consolidation or pleural fluid. IMPRESSION: No acute findings. Electronically Signed   By: Leanna Battles M.D.   On: 02/11/2022 15:28   ? ?Procedures ?Procedures  ? ? ?Medications Ordered in ED ?Medications - No data to display ? ?ED Course/ Medical Decision Making/ A&P ?Clinical Course  as of 02/12/22 0907  ?Mon Feb 11, 2022  ?1535 Chest x-ray interpreted by me as no acute infiltrates.  Awaiting radiology reading. [MB]  ?1638 I was informed by the nurse that the patient is demanding to be dischar

## 2022-02-11 NOTE — ED Notes (Signed)
Spoke with Synetta Fail from group home whom states that they are short-staffed and she just left this area and is on the other side of [place] and does not know when she will be here to pick up patient.  ?

## 2022-02-11 NOTE — ED Notes (Signed)
[facility] contacted at this time and made aware of discharge. Gave me contact information for Synetta Fail for ride back to group home. [phone]) ?

## 2022-06-12 ENCOUNTER — Ambulatory Visit: Payer: 59 | Admitting: Cardiology

## 2022-06-12 NOTE — Progress Notes (Deleted)
Clinical Summary [name] is a 71 y.o.male  Previously followed by cardiology at [facility]. THere are also notes from [facility] and [place] [facility]. Appears cardiac care acrocss multiple medical centers.      CAD -notes mention history of ischemic heart disease and prior MIs, stents - prior CABG x 4 in 2012 - NSTEMI w/ PCI to LCx at [facility] 06/2016   2018 echo [facility]: LVEF >55%, grade I dd,  -TTE from 04/12/2022 shows LVEF 55-60% -NM stress test on 05/29/22 showed moderate-sized inducible defect within the mid and basilar lateral wall with corresponding to the left circumflex artery territory - 05/2022 cath [facility]: DES to SVG-D1   From prior notes   4v CABG 2012 at [facility] lima-lad, 'jump' svg-om1-d1, svg-om2 (at that time Pt had 90 lad, 85 d1, 80 Cx, 100 om1, 80 om2) presented with chest pain and NSTEMI on 06/2016 at [facility]. LHC at that time revealed 100 mlad, 80 om2, 100 om1, 40 diffuse rca; lima and one svg were patent. Pt underwent BMS PCI of native om2.            - has had some intermittent chest pains - episodes every few weeks. Sharp/dull pain mid/left chest. Can occur at rest or with activity. No oother associated symptoms. Can last 1 to 1.5 hours. Not positiona - compliant with meds     2.Pacemaker -placed in [place] in 2020 per his report. Cardiology notes in care everywhere only go up to 2019, I do not have any info on this - he is not sure what type of pacemaker.    3.Chronic schizophrenia   4. Chronic diastolic HF   7482 echo [facility]: LVEF >55%, grade I dd,  - compliant with diuretics.   Past Medical History:  Diagnosis Date   Diabetes mellitus without complication (HCC)    GERD (gastroesophageal reflux disease)    Hypertension    MI (myocardial infarction) ([place])    Paranoid schizophrenia ([place])    Stroke ([place])    Tourette disorder      Allergies  Allergen Reactions   Cephalosporins Hives and Rash    Pt is unaware of this allergy.   Keflex  [Cephalexin] Rash     Current Outpatient Medications  Medication Sig Dispense Refill   acetaminophen (TYLENOL) 500 MG tablet Take 500 mg by mouth every 6 (six) hours as needed for mild pain or fever.     albuterol (VENTOLIN HFA) 108 (90 Base) MCG/ACT inhaler Inhale 1 puff into the lungs every 4 (four) hours as needed.     ammonium lactate (AMLACTIN) 12 % cream Apply 1 application topically 2 (two) times daily.     ARIPiprazole (ABILIFY) 2 MG tablet Take 2 mg by mouth daily.     ARIPiprazole (ABILIFY) 5 MG tablet Take 5 mg by mouth daily.     aspirin EC 325 MG tablet Take 1 tablet (325 mg total) by mouth daily. 30 tablet 0   atorvastatin (LIPITOR) 80 MG tablet Take 1 tablet (80 mg total) by mouth at bedtime. 30 tablet 0   benztropine (COGENTIN) 1 MG tablet Take 1 mg by mouth 2 (two) times daily.     calcium carbonate (TUMS - DOSED IN MG ELEMENTAL CALCIUM) 500 MG chewable tablet Chew 1 tablet by mouth 3 (three) times daily as needed for indigestion or heartburn.     carbidopa-levodopa (SINEMET IR) 25-100 MG tablet Take 1 tablet by mouth 2 (two) times daily.     carvedilol (COREG)  25 MG tablet Take 25 mg by mouth 2 (two) times daily.     clopidogrel (PLAVIX) 75 MG tablet Take 1 tablet (75 mg total) by mouth daily. 30 tablet 0   furosemide (LASIX) 20 MG tablet Take 1 tablet (20 mg total) by mouth daily as needed (for weight gain of 3 lbs/24 hours or 5 lbs/week). 30 tablet 3   gabapentin (NEURONTIN) 300 MG capsule Take 1 capsule (300 mg total) by mouth 3 (three) times daily. 90 capsule 0   hydrochlorothiazide (HYDRODIURIL) 25 MG tablet Take 25 mg by mouth 2 (two) times daily.     insulin aspart (NOVOLOG) 100 UNIT/ML injection Inject 10 Units into the skin 3 (three) times daily before meals. Hold if BS <150 or if resident state they will not be eating meal     insulin glargine (LANTUS) 100 UNIT/ML injection Inject 30 Units into the skin 2 (two) times daily.     isosorbide mononitrate (IMDUR) 60 MG  24 hr tablet Take 1.5 tablets (90 mg total) by mouth daily. 45 tablet 0   losartan (COZAAR) 100 MG tablet Take 1 tablet (100 mg total) by mouth daily. 90 tablet 1   melatonin 5 MG TABS Take 5 mg by mouth at bedtime.     metFORMIN (GLUCOPHAGE) 1000 MG tablet Take 1 tablet (1,000 mg total) by mouth 2 (two) times daily. 30 tablet 0   nitroGLYCERIN (NITROSTAT) 0.4 MG SL tablet Place 1 tablet (0.4 mg total) under the tongue every 5 (five) minutes as needed for chest pain. (Patient taking differently: Place 0.4 mg under the tongue every 5 (five) minutes x 3 doses as needed for chest pain.) 10 tablet 0   omeprazole (PRILOSEC) 20 MG capsule Take 1 capsule by mouth daily.     Pimozide 1 MG TABS Take 1 tablet (1 mg total) by mouth 2 (two) times daily. 60 tablet 0   sertraline (ZOLOFT) 50 MG tablet Take 75 mg by mouth daily at 6 (six) AM.     tamsulosin (FLOMAX) 0.4 MG CAPS capsule Take 1 capsule (0.4 mg total) by mouth daily. 30 capsule 0   traZODone (DESYREL) 100 MG tablet Take 100 mg by mouth at bedtime.     VIMPAT 50 MG TABS tablet Take 100 mg by mouth 2 (two) times daily.     No current facility-administered medications for this visit.     Past Surgical History:  Procedure Laterality Date   CARDIAC SURGERY     CORONARY ARTERY BYPASS GRAFT     HAND SURGERY     TONSILLECTOMY       Allergies  Allergen Reactions   Cephalosporins Hives and Rash    Pt is unaware of this allergy.   Keflex [Cephalexin] Rash      Family History  Problem Relation Age of Onset   Heart attack Father      Social History [name] reports that he has never smoked. He has never used smokeless tobacco. [name] reports that he does not currently use alcohol.   Review of Systems CONSTITUTIONAL: No weight loss, fever, chills, weakness or fatigue.  HEENT: Eyes: No visual loss, blurred vision, double vision or yellow sclerae.No hearing loss, sneezing, congestion, runny nose or sore throat.  SKIN: No rash  or itching.  CARDIOVASCULAR:  RESPIRATORY: No shortness of breath, cough or sputum.  GASTROINTESTINAL: No anorexia, nausea, vomiting or diarrhea. No abdominal pain or blood.  GENITOURINARY: No burning on urination, no polyuria NEUROLOGICAL: No headache, dizziness, syncope,  paralysis, ataxia, numbness or tingling in the extremities. No change in bowel or bladder control.  MUSCULOSKELETAL: No muscle, back pain, joint pain or stiffness.  LYMPHATICS: No enlarged nodes. No history of splenectomy.  PSYCHIATRIC: No history of depression or anxiety.  ENDOCRINOLOGIC: No reports of sweating, cold or heat intolerance. No polyuria or polydipsia.  Marland Kitchen   Physical Examination There were no vitals filed for this visit. There were no vitals filed for this visit.  Gen: resting comfortably, no acute distress HEENT: no scleral icterus, pupils equal round and reactive, no palptable cervical adenopathy,  CV Resp: Clear to auscultation bilaterally GI: abdomen is soft, non-tender, non-distended, normal bowel sounds, no hepatosplenomegaly MSK: extremities are warm, no edema.  Skin: warm, no rash Neuro:  no focal deficits Psych: appropriate affect   Diagnostic Studies  05/2022 nuclear stress [facility] 1.  Moderate-sized inducible defect within the mid and basilar lateral wall with corresponding to the left circumflex artery territory.  2.  No transmural scars.  3.  Mild septal hypokinesis.  4.  Left ventricular ejection fraction is borderline decreased and measures 49% during stress imaging.  03/2022 echo [facility] SUMMARY  This was a limited echo for RV/LV function  Global LV systolic function is preserved  LV ejection fraction = 55-60%.  No segmental wall motion abnormalities seen in the left ventricle  The right ventricle is mildly dilated.  The right ventricular systolic function is normal.  There is no pericardial effusion.   05/2022 cath  Findings   Coronary Findings Diagnostic Dominance: Right    Left Anterior Descending: Ost LAD to Prox LAD lesion is 100% stenosed.    Left Circumflex: There is moderate diffuse disease throughout the vessel. Prox Cx lesion is 30% stenosed. Mid Cx lesion is 60% stenosed. First Obtuse Marginal Mariko Nowakowski: 1st Mrg lesion is 100% stenosed. Second Obtuse Marginal Adean Milosevic: 2nd Mrg lesion is 100% stenosed.    Right Coronary Artery: There is mild diffuse disease throughout the vessel. Mid RCA lesion is 50% stenosed. Right Posterior Descending Artery: There is mild diffuse disease throughout the vessel. Right Posterior Atrioventricular Artery: There is mild diffuse disease throughout the vessel.    Graft To 1st Mrg: Mid Graft lesion is 90% stenosed.    Y-graft Keir Viernes To 1st Diag: Origin to Prox Graft lesion is 100% stenosed.    Graft To 2nd Mrg: Origin to Prox Graft lesion is 100% stenosed.    LIMA Graft To Dist LAD      Intervention   Mid Graft lesion (Graft To 1st Mrg): Stent: Drug-eluting stent was successfully placed. The stent used was a SYSTEM CORONARY STENT XIENCE SKYPOINT MULTI-LINK PEBAX EVEROLIMUS COCR FLUORINATED COPOLYMER L23 MM L145 CM OD2.5 MM. Stent was deployed by way of balloon expansion. Maximum pressure: 15 atm. Inflation time: 12 sec. Post-Intervention Lesion Assessment: There is a 0% residual stenosis post intervention.            Assessment and Plan    1.CAD -long complex history, partly because cardiac care scattered over several medical centers - recent chest pain sympomts - he reports he would never want another stent. Given this no reason to pursue stress testing. Work with antianginal therapy - increase imdur to 63m daily   2. Pacemaker - unknown history, [place] in 2020 [place] We will try to request records. Pacemaker is right sided - have him establish with device clinic.    3. Chronic diastolic HF - appears euvolemic, continue diuretics    JAlphonse Guild  Harl Bowie, M.D., F.A.C.C.

## 2023-03-11 ENCOUNTER — Encounter: Payer: 59 | Admitting: Internal Medicine

## 2023-04-11 IMAGING — CT CT HEAD W/O CM
4 series · 17 of 47 positions shown, 19 images · non-contrast
Comparison: None.

CLINICAL DATA: Fall

EXAM:
CT HEAD WITHOUT CONTRAST
TECHNIQUE: Contiguous axial images were obtained from the base of the skull
through the vertex without intravenous contrast.

[Series 2: head w o · axial · 0.47mm/px · z∈[+64,+184]mm · 7 of 33 slices shown, 9 images]
[im 5/33  brain]
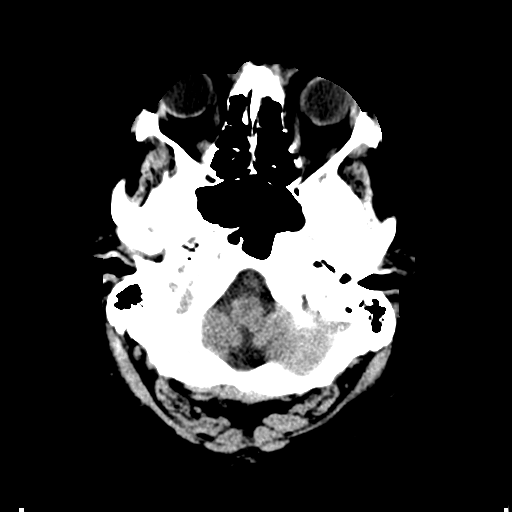
[im 5/33  bone]
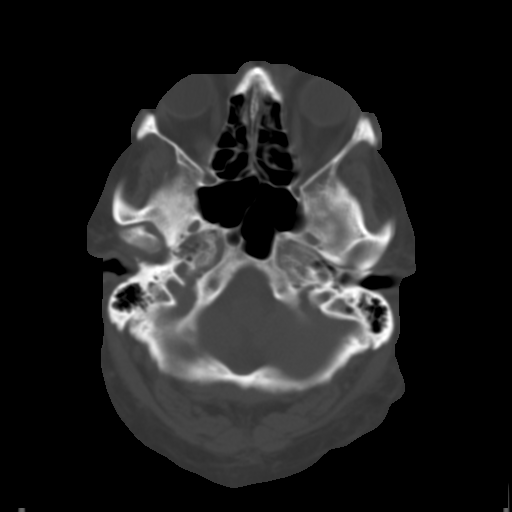
[im 9/33  brain]
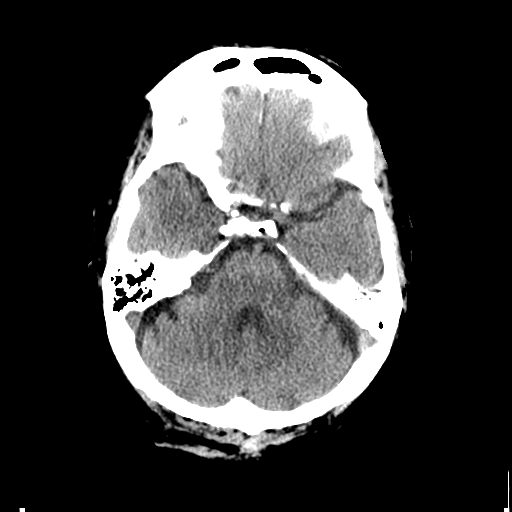
[im 13/33  brain]
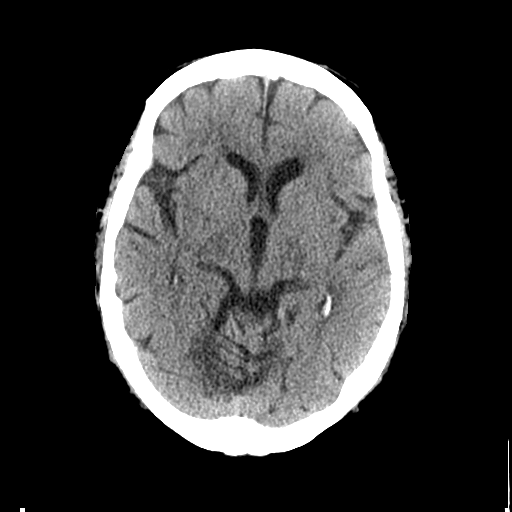
[im 17/33  brain]
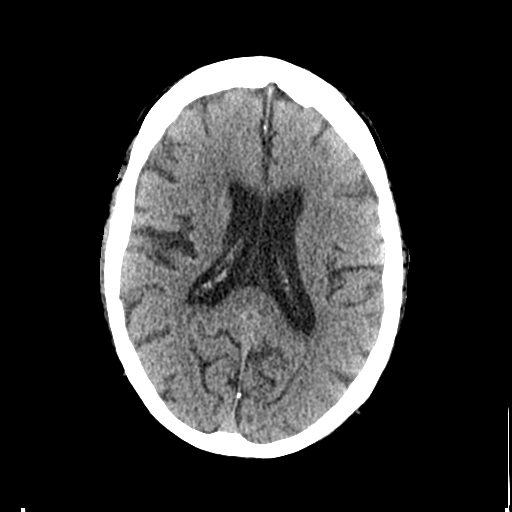
[im 21/33  brain]
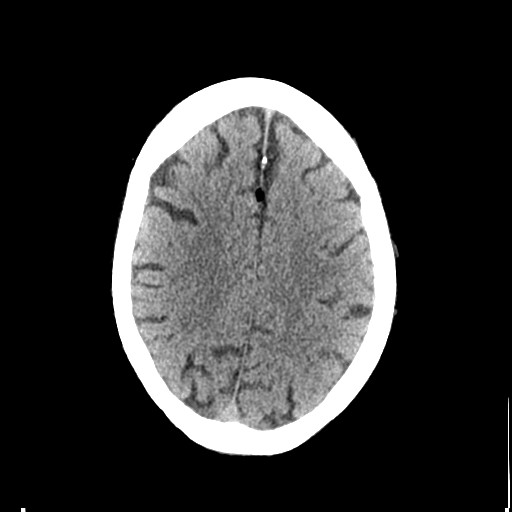
[im 21/33  bone]
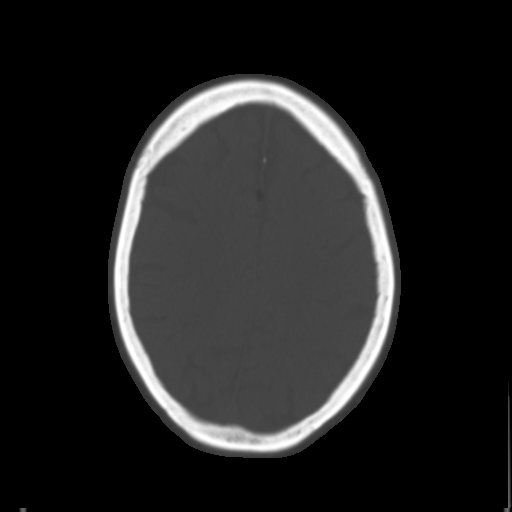
[im 25/33  brain]
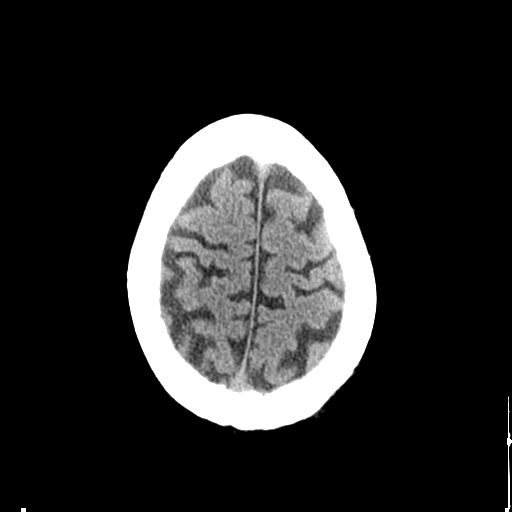
[im 29/33  brain]
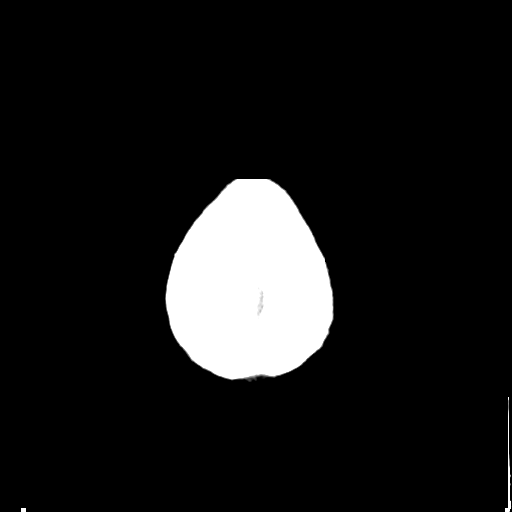

[Series 3: head bone · axial · 0.47mm/px · z∈[+60,+116]mm · 4 of 83 slices shown]
[im 9/83  bone]
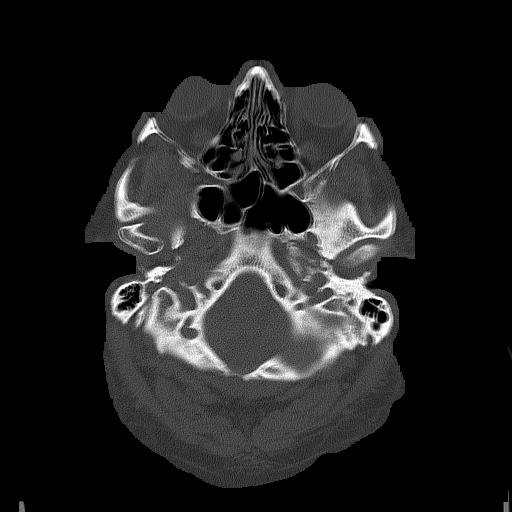
[im 17/83  bone]
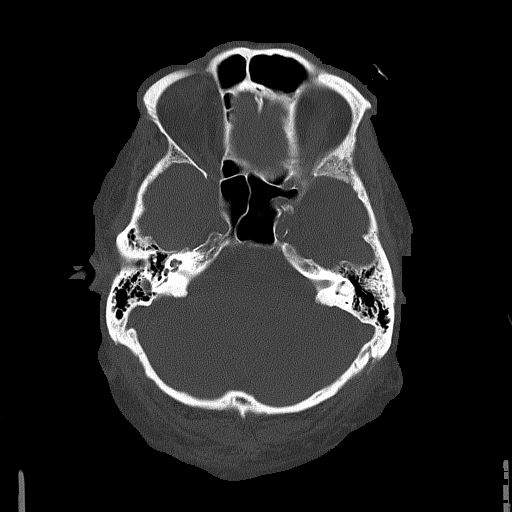
[im 25/83  bone]
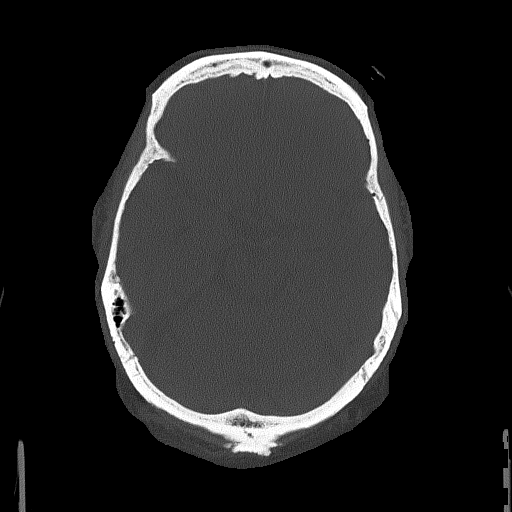
[im 37/83  bone]
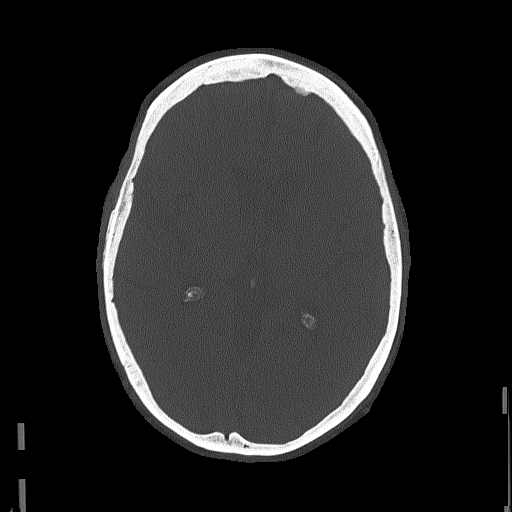

[Series 4: coronal soft · coronal · 0.35mm/px · 3 of 78 slices shown]
[im 26/78  brain]
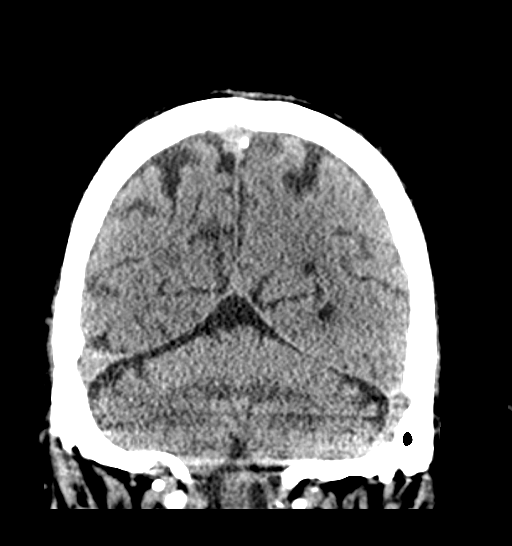
[im 35/78  brain]
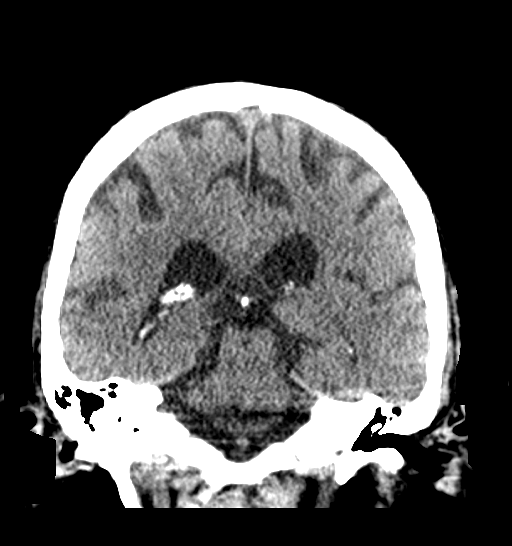
[im 43/78  brain]
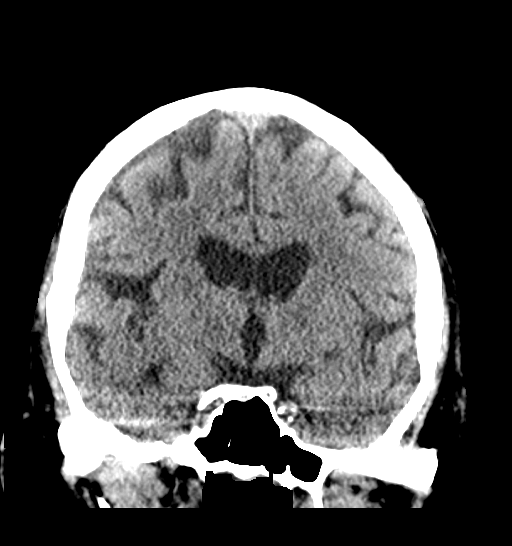

[Series 5: sagittal soft · sagittal · 0.35mm/px · 3 of 60 slices shown]
[im 20/60  brain]
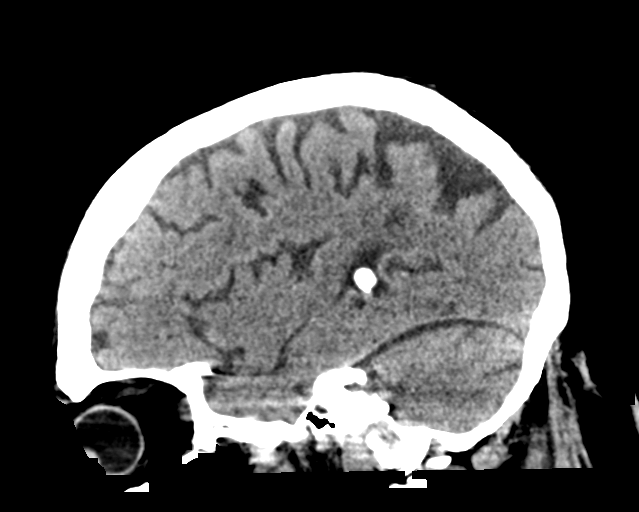
[im 30/60  brain]
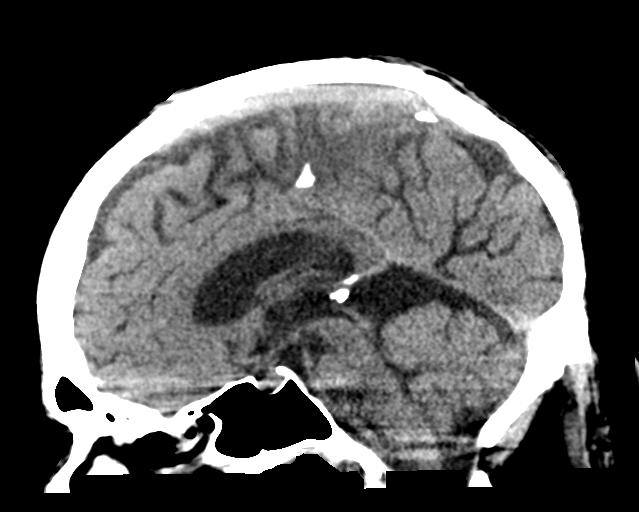
[im 40/60  brain]
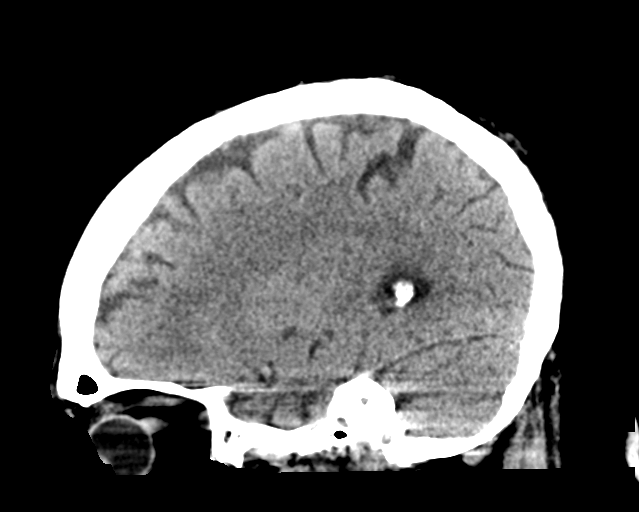

[17 of 47 positions shown; findings below may reference images not displayed]

FINDINGS: Brain: There is no mass, hemorrhage or extra-axial collection. The
size and configuration of the ventricles and extra-axial CSF spaces
are normal. The brain parenchyma is normal, without acute or chronic
infarction.

Vascular: No abnormal hyperdensity of the major intracranial
arteries or dural venous sinuses. No intracranial atherosclerosis.

Skull: The visualized skull base, calvarium and extracranial soft
tissues are normal.

Sinuses/Orbits: No fluid levels or advanced mucosal thickening of
the visualized paranasal sinuses. No mastoid or middle ear effusion.
The orbits are normal.
IMPRESSION: Normal head CT.

## 2023-07-23 ENCOUNTER — Telehealth: Payer: Self-pay

## 2023-07-23 NOTE — Telephone Encounter (Signed)
Britta Mccreedy from [facility] called stating the patient came to them without a monitor. I told her I will mail the patient a monitor. She act like she did not want to give me the address to the nursing home to send the patient a monitor. I explained to her that we cannot monitor the patient pacemaker without him having a monitor. She finally gave me the address.   Monitor ordered. They should receive it in 7-10 business days.  [facility] phone number is [phone].
